# Patient Record
Sex: Male | Born: 1945 | Race: White | Hispanic: No | Smoking: Never smoker
Health system: Southern US, Community
[De-identification: ages and names within clinical notes are randomized; demographics above are authoritative.]

## PROBLEM LIST (undated history)

## (undated) DIAGNOSIS — E119 Type 2 diabetes mellitus without complications: Secondary | ICD-10-CM

## (undated) DIAGNOSIS — G718 Other primary disorders of muscles: Secondary | ICD-10-CM

## (undated) DIAGNOSIS — E785 Hyperlipidemia, unspecified: Secondary | ICD-10-CM

## (undated) DIAGNOSIS — G629 Polyneuropathy, unspecified: Secondary | ICD-10-CM

## (undated) DIAGNOSIS — I10 Essential (primary) hypertension: Secondary | ICD-10-CM

## (undated) DIAGNOSIS — N400 Enlarged prostate without lower urinary tract symptoms: Secondary | ICD-10-CM

## (undated) DIAGNOSIS — Z789 Other specified health status: Secondary | ICD-10-CM

## (undated) DIAGNOSIS — I2510 Atherosclerotic heart disease of native coronary artery without angina pectoris: Secondary | ICD-10-CM

## (undated) DIAGNOSIS — E7800 Pure hypercholesterolemia, unspecified: Secondary | ICD-10-CM

## (undated) DIAGNOSIS — I219 Acute myocardial infarction, unspecified: Secondary | ICD-10-CM

## (undated) DIAGNOSIS — N319 Neuromuscular dysfunction of bladder, unspecified: Secondary | ICD-10-CM

## (undated) HISTORY — PX: CARDIAC SURGERY: SHX584

## (undated) HISTORY — PX: TONSILLECTOMY: SUR1361

## (undated) HISTORY — DX: Polyneuropathy, unspecified: G62.9

## (undated) HISTORY — PX: CORONARY ARTERY BYPASS GRAFT: SHX141

## (undated) HISTORY — DX: Other primary disorders of muscles: G71.8

## (undated) HISTORY — PX: OTHER SURGICAL HISTORY: SHX169

## (undated) HISTORY — DX: Benign prostatic hyperplasia without lower urinary tract symptoms: N40.0

## (undated) HISTORY — DX: Hyperlipidemia, unspecified: E78.5

## (undated) HISTORY — DX: Acute myocardial infarction, unspecified: I21.9

## (undated) HISTORY — DX: Neuromuscular dysfunction of bladder, unspecified: N31.9

## (undated) HISTORY — DX: Pure hypercholesterolemia, unspecified: E78.00

## (undated) HISTORY — PX: FINGER FRACTURE SURGERY: SHX638

## (undated) HISTORY — DX: Type 2 diabetes mellitus without complications: E11.9

## (undated) HISTORY — DX: Essential (primary) hypertension: I10

---
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
## 2009-05-24 ENCOUNTER — Ambulatory Visit: Payer: Self-pay | Admitting: Gastroenterology

## 2010-09-01 DIAGNOSIS — I219 Acute myocardial infarction, unspecified: Secondary | ICD-10-CM

## 2010-09-01 HISTORY — DX: Acute myocardial infarction, unspecified: I21.9

## 2014-03-10 ENCOUNTER — Ambulatory Visit
Admission: RE | Admit: 2014-03-10 | Discharge: 2014-03-10 | Disposition: A | Discharge status: Home / Self Care | Payer: Self-pay | Source: Ambulatory Visit | Attending: Neurological Surgery | Admitting: Neurological Surgery

## 2014-03-10 ENCOUNTER — Other Ambulatory Visit: Payer: Self-pay | Admitting: Neurological Surgery

## 2014-03-10 DIAGNOSIS — R52 Pain, unspecified: Secondary | ICD-10-CM

## 2014-05-31 ENCOUNTER — Other Ambulatory Visit: Payer: Self-pay | Admitting: Neurological Surgery

## 2014-05-31 DIAGNOSIS — M47812 Spondylosis without myelopathy or radiculopathy, cervical region: Secondary | ICD-10-CM

## 2014-06-09 ENCOUNTER — Other Ambulatory Visit: Payer: Self-pay | Admitting: Neurological Surgery

## 2014-06-09 DIAGNOSIS — M25512 Pain in left shoulder: Secondary | ICD-10-CM

## 2014-06-12 ENCOUNTER — Ambulatory Visit
Admission: RE | Admit: 2014-06-12 | Discharge: 2014-06-12 | Disposition: A | Discharge status: Home / Self Care | Payer: No Typology Code available for payment source | Source: Ambulatory Visit | Attending: Neurological Surgery | Admitting: Neurological Surgery

## 2014-06-12 DIAGNOSIS — M65812 Other synovitis and tenosynovitis, left shoulder: Secondary | ICD-10-CM | POA: Insufficient documentation

## 2014-06-12 DIAGNOSIS — M542 Cervicalgia: Secondary | ICD-10-CM | POA: Insufficient documentation

## 2014-06-12 DIAGNOSIS — M25512 Pain in left shoulder: Secondary | ICD-10-CM

## 2014-06-12 DIAGNOSIS — M47812 Spondylosis without myelopathy or radiculopathy, cervical region: Secondary | ICD-10-CM | POA: Insufficient documentation

## 2014-06-26 ENCOUNTER — Ambulatory Visit
Admission: RE | Admit: 2014-06-26 | Discharge: 2014-06-26 | Disposition: A | Discharge status: Home / Self Care | Payer: No Typology Code available for payment source | Source: Ambulatory Visit | Attending: Neurology | Admitting: Neurology

## 2014-06-26 DIAGNOSIS — G608 Other hereditary and idiopathic neuropathies: Secondary | ICD-10-CM | POA: Insufficient documentation

## 2014-06-26 DIAGNOSIS — G5602 Carpal tunnel syndrome, left upper limb: Secondary | ICD-10-CM | POA: Insufficient documentation

## 2014-06-26 DIAGNOSIS — G5601 Carpal tunnel syndrome, right upper limb: Secondary | ICD-10-CM | POA: Insufficient documentation

## 2014-06-26 NOTE — Procedures (Signed)
Date: 06/26/2014  PROCEDURE: Nerve conduction/EMG bilateral upper and lower  extremities.    REFERRING PHYSICIAN: Haskel Khan, MD    CLINICAL: Nerve conduction EMG of the bilateral upper and lower  extremities were performed for this 68 year old man with  progressive left arm weakness and atrophy of muscle groups to  assess for a focal radiculopathy, a compressive mononeuropathy  or large fiber polyneuropathy or alternatively motor neuron  disease.  The patient also complains of numbness in the right  foot.  The patient has history of diabetes mellitus type 2.  On  neurological exam, deep tendon reflexes are 1/4 throughout and  absent at the ankles.  Tinel signs are absent at the bilateral  median nerves at the wrist and bilateral ulnar nerves at the  elbow.  Power is 5/5 throughout in the upper extremities  but for the left upper extremity where there is atrophy in the  muscles of the left forearm and triceps.  Power is 4 to 4-/5 at  the intrinsic muscles of the left hand and triceps.  There are  no fasciculations.    FINDINGS: MOTOR NERVE STUDIES:    At the bilateral median motor nerves, there were mild reductions  in conduction velocities and mild prolongation of distal  latencies.  At the bilateral ulnar motor nerves there were mild  reductions in conduction velocities with no significant change  across the elbows in terms of the CMAP amplitudes.  At the  bilateral peroneal and tibial motor nerves, there were mild  reductions in conduction velocities.  Sequential stimulation of  the bilateral median and bilateral ulnar motor nerves at the  wrist, recording at the 2nd lumbrical and 1st dorsal interossei  respectively, identified significant prolongation of distal  latencies when stimulating at the bilateral median motor nerves.    SENSORY NERVE STUDIES:    At the bilateral median mixed palmar nerves, there were mild  reductions in conduction velocities and moderate prolongation of  peak distal latencies.  On  the right side only there was a mild  reduction in SNAP amplitude.  At the bilateral ulnar mixed  palmar nerves, there were mild to moderate reductions in  conduction velocities and mild prolongation of peak distal  latencies.  On the left side only there was a mild reduction in  SNAP amplitude.  At the bilateral sural sensory nerves, there  were mild reductions in conduction velocities.  At the bilateral  superficial peroneal sensory nerves, there were also mild  reductions in conduction velocities.  At the bilateral medial  and lateral antebrachial cutaneous sensory nerves, there were  mild reductions in conduction velocities.    F-WAVE LATENCIES:    F-wave latencies at the right median and bilateral ulnar motor  nerves were all prolonged whereas elsewhere they were normal.    EMG:    Concentric needle examination of selected bilateral upper,  bilateral lower, bilateral cervical and bilateral lumbosacral  paraspinal muscles identified no increased insertional activity.  With activation and recruitment, there was a mild to moderate  increase in compound motor unit action potential amplitude with  increased duration, polyphasic features, mild reduction in  activation and moderate reduction in recruitment at the  bilateral abductor pollicis brevis as well as the left first  dorsal interossei, extensor indicis proprius and triceps.    CLINICAL INTERPRETATION: There is electrophysiological evidence  for the following:    1.  Chronic moderate bilateral carpal tunnel syndrome.  2.  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  3.  Chronic moderate neurogenic changes at the left C7 and      C8 myotomes.  Possible localizations would be left cervical      radiculopathies or alternatively left brachial plexus, lower      trunk.    There is no neurophysiological evidence for motor neuron disease  to explain the patient's left upper extremity focal weakness  with atrophy.        [id number]  DD:  06/26/2014 10:22:36  DT: 06/26/2014 10:54:43  JOB: [id number]

## 2014-07-17 ENCOUNTER — Other Ambulatory Visit: Payer: Self-pay | Admitting: Neurological Surgery

## 2014-07-17 ENCOUNTER — Ambulatory Visit
Admission: RE | Admit: 2014-07-17 | Discharge: 2014-07-17 | Disposition: A | Discharge status: Home / Self Care | Payer: No Typology Code available for payment source | Source: Ambulatory Visit | Attending: Neurological Surgery | Admitting: Neurological Surgery

## 2014-07-17 DIAGNOSIS — E1141 Type 2 diabetes mellitus with diabetic mononeuropathy: Secondary | ICD-10-CM | POA: Insufficient documentation

## 2014-07-17 DIAGNOSIS — R0602 Shortness of breath: Secondary | ICD-10-CM | POA: Insufficient documentation

## 2015-01-09 ENCOUNTER — Ambulatory Visit (HOSPITAL_BASED_OUTPATIENT_CLINIC_OR_DEPARTMENT_OTHER): Payer: No Typology Code available for payment source | Admitting: Anesthesiology

## 2015-01-09 ENCOUNTER — Ambulatory Visit
Admission: RE | Admit: 2015-01-09 | Discharge: 2015-01-09 | Disposition: A | Discharge status: Home / Self Care | Payer: No Typology Code available for payment source | Attending: Gastroenterology | Admitting: Gastroenterology

## 2015-01-09 ENCOUNTER — Ambulatory Visit (HOSPITAL_BASED_OUTPATIENT_CLINIC_OR_DEPARTMENT_OTHER): Payer: No Typology Code available for payment source | Admitting: Gastroenterology

## 2015-01-09 ENCOUNTER — Encounter (HOSPITAL_BASED_OUTPATIENT_CLINIC_OR_DEPARTMENT_OTHER): Payer: Self-pay

## 2015-01-09 ENCOUNTER — Encounter (HOSPITAL_BASED_OUTPATIENT_CLINIC_OR_DEPARTMENT_OTHER)
Admission: RE | Disposition: A | Discharge status: Home / Self Care | Payer: Self-pay | Source: Home / Self Care | Attending: Gastroenterology

## 2015-01-09 DIAGNOSIS — D123 Benign neoplasm of transverse colon: Secondary | ICD-10-CM | POA: Insufficient documentation

## 2015-01-09 DIAGNOSIS — K5730 Diverticulosis of large intestine without perforation or abscess without bleeding: Secondary | ICD-10-CM | POA: Insufficient documentation

## 2015-01-09 DIAGNOSIS — D124 Benign neoplasm of descending colon: Secondary | ICD-10-CM | POA: Insufficient documentation

## 2015-01-09 DIAGNOSIS — K648 Other hemorrhoids: Secondary | ICD-10-CM | POA: Insufficient documentation

## 2015-01-09 DIAGNOSIS — Z1211 Encounter for screening for malignant neoplasm of colon: Secondary | ICD-10-CM | POA: Insufficient documentation

## 2015-01-09 HISTORY — PX: COLONOSCOPY: SHX174

## 2015-01-09 HISTORY — DX: Type 2 diabetes mellitus without complications: E11.9

## 2015-01-09 HISTORY — DX: Essential (primary) hypertension: I10

## 2015-01-09 SURGERY — DONT USE, USE 1094-COLONOSCOPY, DIAGNOSTIC (SCREENING)
Anesthesia: Anesthesia MAC / Sedation | Site: Anus | Wound class: Clean Contaminated

## 2015-01-09 MED ORDER — ONDANSETRON HCL 4 MG/2ML IJ SOLN
4.0000 mg | INTRAMUSCULAR | Status: DC | PRN
Start: 2015-01-09 — End: 2015-01-09

## 2015-01-09 MED ORDER — PROPOFOL 10 MG/ML IV EMUL (WRAP)
INTRAVENOUS | Status: AC
Start: 2015-01-09 — End: ?
  Filled 2015-01-09: qty 20

## 2015-01-09 MED ORDER — ONDANSETRON 4 MG PO TBDP
4.0000 mg | ORAL_TABLET | ORAL | Status: DC | PRN
Start: 2015-01-09 — End: 2015-01-09

## 2015-01-09 MED ORDER — SODIUM CHLORIDE 0.9 % IV SOLN
INTRAVENOUS | Status: DC
Start: 2015-01-09 — End: 2015-01-09

## 2015-01-09 MED ORDER — PROPOFOL 10 MG/ML IV EMUL (WRAP)
INTRAVENOUS | Status: DC | PRN
Start: 2015-01-09 — End: 2015-01-09
  Administered 2015-01-09: 80 mg via INTRAVENOUS
  Administered 2015-01-09 (×2): 40 mg via INTRAVENOUS
  Administered 2015-01-09: 50 mg via INTRAVENOUS
  Administered 2015-01-09 (×3): 40 mg via INTRAVENOUS

## 2015-01-09 SURGICAL SUPPLY — 10 items
CONN IRRG TORRENT 1WAYVAL ENDO (Supply) ×2 IMPLANT
FORCEP BIOPSY HOT RADIAL JAW 4 (Supply) IMPLANT
FORCEP BIOPSY RAD JAW 1333-40 (Supply) IMPLANT
FORCEP RADIAL JAW JUMBO 240CM (Supply) IMPLANT
MARKER ENDOSCOPIC SPOT (Supply) IMPLANT
NDL INTERJECT SCLERO 25G (Supply) IMPLANT
SNARE ROTATE SM OVAL MED STFF (Supply) IMPLANT
SNARE SMALL CAPTIV 6230 (Supply) IMPLANT
SNARE WIRE DISP 10MM OVAL (Supply) ×2 IMPLANT
TUBING IRRIGATION TORRENT (Supply) ×2 IMPLANT

## 2015-01-09 NOTE — Transfer of Care (Signed)
Anesthesia Transfer of Care Note    Patient: Duane Thomas    Last vitals:   Filed Vitals:    01/09/15 1001   BP: 119/95   Pulse: 78   Temp:    Resp: 18   SpO2: 98%       Oxygen: Nasal Cannula     Mental Status:awake and sedated    Airway: Natural    Cardiovascular Status:  stable

## 2015-01-09 NOTE — Discharge Instructions (Signed)
[facility]  Colonoscopy/Sigmoidoscopy  Discharge Instructions    Thank you for allowing us to be a part of your health care experience.  We realize you may not fully recall your discharge care.  The following information is to guide you.    A.  After you leave the hospital:   1.  Due to the effects of the sedatives, you may feel tired for the remainder of today.   2.  DO NOT DRIVE OR OPERATE HAZARDOUS MACHINERY until tomorrow.   3.  Please rest and drink extra fluids today.  Avoid alcohol today.   4.  Resume your normal diet as tolerated.   5. If you experience anal soreness, you may apply a soothing ointment of your choice.   6.  You may feel bloated and pass air today.   7.  You may resume your normal activities (work) tomorrow.    B.  If you experience any of the following danger signs or symptoms, call your doctor immediately:  After business hours call [phone] for [facility] for physician guidance.    1.   Passing  Blood in the stool or a change of stool consistency (black).   2.  Passing Clotted blood.   3.  New onset of pain or distension, that does not subside or prevents you from normal activity.   4.  Redness or swelling at the IV insertion site that continues or worsens over 2-3 days. Initial warm compresses may be helpful.    All [facility] us [facility] the [facility] who supported you today during your procedure wish you a quick and comfortable recovery.  For any questions or concerns about your personal care, contact us at [phone].

## 2015-01-09 NOTE — Anesthesia Postprocedure Evaluation (Signed)
Anesthesia Post Evaluation    Patient: Duane Thomas    Procedures performed: Procedure(s):  COLONOSCOPY    Anesthesia type: MAC    Patient location:PACU    Last vitals:   Filed Vitals:    01/09/15 1001   BP: 119/95   Pulse: 78   Temp:    Resp: 18   SpO2: 98%       Post pain: Patient not complaining of pain, continue current therapy      Mental Status:awake and sedated    Respiratory Function: tolerating nasal cannula    Cardiovascular: stable    Nausea/Vomiting: patient not complaining of nausea or vomiting    Hydration Status: adequate    Post assessment: no apparent anesthetic complications

## 2015-01-09 NOTE — Anesthesia Preprocedure Evaluation (Signed)
Anesthesia Evaluation    AIRWAY    Mallampati: I    TM distance: <3 FB  Neck ROM: full  Mouth Opening:full   CARDIOVASCULAR    regular and normal       DENTAL    no notable dental hx     PULMONARY    clear to auscultation     OTHER FINDINGS                      Anesthesia Plan    ASA 3     MAC               (Pt tolerates activity w/o CP/SOB, denies SL NTG use, anesth plan d/w pt in detail)      intravenous induction   Detailed anesthesia plan: MAC      Post Op: other  Post op pain management: per surgeon    informed consent obtained    ECG reviewed  pertinent labs reviewed

## 2015-01-09 NOTE — Brief Op Note (Signed)
See full colonoscopy report in provation MD

## 2015-01-10 ENCOUNTER — Encounter (HOSPITAL_BASED_OUTPATIENT_CLINIC_OR_DEPARTMENT_OTHER): Payer: Self-pay | Admitting: Gastroenterology

## 2016-12-11 ENCOUNTER — Ambulatory Visit
Admission: RE | Admit: 2016-12-11 | Discharge: 2016-12-11 | Disposition: A | Discharge status: Home / Self Care | Payer: No Typology Code available for payment source | Source: Ambulatory Visit | Attending: Family Medicine | Admitting: Family Medicine

## 2016-12-11 DIAGNOSIS — E119 Type 2 diabetes mellitus without complications: Secondary | ICD-10-CM | POA: Insufficient documentation

## 2016-12-11 DIAGNOSIS — I10 Essential (primary) hypertension: Secondary | ICD-10-CM | POA: Insufficient documentation

## 2016-12-11 DIAGNOSIS — R109 Unspecified abdominal pain: Secondary | ICD-10-CM | POA: Insufficient documentation

## 2016-12-11 LAB — COMPREHENSIVE METABOLIC PANEL
ALT: 26 U/L (ref 0–55)
AST (SGOT): 19 U/L (ref 10–42)
Albumin/Globulin Ratio: 1.44 Ratio (ref 0.70–1.50)
Albumin: 3.9 gm/dL (ref 3.5–5.0)
Alkaline Phosphatase: 67 U/L (ref 40–145)
Anion Gap: 12.6 mMol/L (ref 7.0–18.0)
BUN / Creatinine Ratio: 10.6 Ratio (ref 10.0–30.0)
BUN: 10 mg/dL (ref 7–22)
Bilirubin, Total: 1.1 mg/dL (ref 0.1–1.2)
CO2: 24.8 mMol/L (ref 20.0–30.0)
Calcium: 9.1 mg/dL (ref 8.5–10.5)
Chloride: 107 mMol/L (ref 98–110)
Creatinine: 0.94 mg/dL (ref 0.80–1.30)
EGFR: 82 mL/min/{1.73_m2} (ref 60–150)
Globulin: 2.7 gm/dL (ref 2.0–4.0)
Glucose: 144 mg/dL — ABNORMAL HIGH (ref 71–99)
Osmolality Calc: 281 mOsm/kg (ref 275–300)
Potassium: 4.4 mMol/L (ref 3.5–5.3)
Protein, Total: 6.6 gm/dL (ref 6.0–8.3)
Sodium: 140 mMol/L (ref 136–147)

## 2016-12-11 LAB — CBC AND DIFFERENTIAL
Basophils %: 0.2 % (ref 0.0–3.0)
Basophils Absolute: 0 10*3/uL (ref 0.0–0.3)
Eosinophils %: 0.6 % (ref 0.0–7.0)
Eosinophils Absolute: 0.1 10*3/uL (ref 0.0–0.8)
Hematocrit: 45.4 % (ref 39.0–52.5)
Hemoglobin: 15.2 gm/dL (ref 13.0–17.5)
Lymphocytes Absolute: 1.6 10*3/uL (ref 0.6–5.1)
Lymphocytes: 15.2 % (ref 15.0–46.0)
MCH: 31 pg (ref 28–35)
MCHC: 34 gm/dL (ref 32–36)
MCV: 93 fL (ref 80–100)
MPV: 8.4 fL (ref 6.0–10.0)
Monocytes Absolute: 0.7 10*3/uL (ref 0.1–1.7)
Monocytes: 7.1 % (ref 3.0–15.0)
Neutrophils %: 76.8 % (ref 42.0–78.0)
Neutrophils Absolute: 7.9 10*3/uL (ref 1.7–8.6)
PLT CT: 254 10*3/uL (ref 130–440)
RBC: 4.91 10*6/uL (ref 4.00–5.70)
RDW: 11.8 % (ref 11.0–14.0)
WBC: 10.3 10*3/uL (ref 4.0–11.0)

## 2016-12-11 LAB — HEMOGLOBIN A1C: Hgb A1C, %: 6.3 %

## 2016-12-14 LAB — VH MISCELLANEOUS TEST: Test code and name of test: 205

## 2018-06-14 ENCOUNTER — Ambulatory Visit (INDEPENDENT_AMBULATORY_CARE_PROVIDER_SITE_OTHER): Payer: Medicare Other | Admitting: Urology

## 2018-06-14 ENCOUNTER — Encounter: Payer: Self-pay | Admitting: Urology

## 2018-06-14 VITALS — BP 142/86 | HR 83 | Ht 69.0 in | Wt 204.1 lb

## 2018-06-14 DIAGNOSIS — N390 Urinary tract infection, site not specified: Secondary | ICD-10-CM

## 2018-06-14 DIAGNOSIS — N138 Other obstructive and reflux uropathy: Secondary | ICD-10-CM | POA: Diagnosis not present

## 2018-06-14 DIAGNOSIS — N401 Enlarged prostate with lower urinary tract symptoms: Secondary | ICD-10-CM | POA: Diagnosis not present

## 2018-06-14 LAB — BLADDER SCAN AMB NON-IMAGING: Scan Result: 374

## 2018-06-14 MED ORDER — TAMSULOSIN HCL 0.4 MG PO CAPS: 0.4000 mg | ORAL_CAPSULE | Freq: Every day | ORAL | 3 refills | Status: DC

## 2018-06-14 NOTE — Progress Notes (Signed)
06/14/2018 2:43 PM   Kyle Richards [birth year] [id number]  Referring provider: Frederica Kuster, PA-C [name], [place] [id number]  Chief Complaint  Patient presents with  . Urinary Tract Infection    HPI: Patient is 72 year old Caucasian male who was referred by Kyle Pina L. Meyer, PA-C for urinary tract infection.  He presented to [facility] urgent care with a complaint of 2 days of nocturia, urinary urgency, decreased urination.  His UA was positive for greater than 50 WBCs and many bacteria.  His urine culture was positive for Staphylococcus epidermidis.  He is given a prescription of Ceftin and referred on to urology.  He is currently on finasteride 5 mg daily.    Today, he is complaining of urinary frequency.  Patient denies any gross hematuria, dysuria or suprapubic/flank pain.  Patient denies any fevers, chills, nausea or vomiting.  His PVR is 374 mL.  He was switched to cefuroxime today.    He is seen at the [place].  He has Agent Orange exposure.    He was seen by urology in [place] for kidney stones in 2013.  He also underwent a prostate biopsy with Imprimis eleven years ago.    PMH: Past Medical History:  Diagnosis Date  . Diabetes ([place])   . Heart attack ([place]) 2012  . High cholesterol   . Hypertension   . Neuropathy     Surgical History: Open heart surgery 2012 Tonsils 1953 Bones broken  Home Medications:  Allergies as of 06/14/2018   No Known Allergies     Medication List        Accurate as of 06/14/18  2:43 PM. Always use your most recent med list.          aspirin 81 MG chewable tablet Chew 81 mg by mouth daily.   atorvastatin 20 MG tablet Commonly known as:  LIPITOR Take 20 mg by mouth daily.   cefUROXime 250 MG tablet Commonly known as:  CEFTIN Take 250 mg by mouth 2 (two) times daily.   finasteride 5 MG tablet Commonly known as:  PROSCAR Take 5 mg by mouth daily.   lisinopril 5 MG tablet Commonly known as:   PRINIVIL,ZESTRIL Take 5 mg by mouth daily.   metFORMIN 1000 MG tablet Commonly known as:  GLUCOPHAGE Take 1,000 mg by mouth daily.   metoprolol tartrate 25 MG tablet Commonly known as:  LOPRESSOR Take 25 mg by mouth daily.   tamsulosin 0.4 MG Caps capsule Commonly known as:  FLOMAX Take 1 capsule (0.4 mg total) by mouth daily.   VITAMIN D-1000 MAX ST 1000 units tablet Generic drug:  Cholecalciferol Take 1,000 Units by mouth daily.       Allergies: No Known Allergies  Family History: He is adopted.    Social History:  reports that he has never smoked. He has never used smokeless tobacco. He reports that he drinks alcohol. He reports that he does not use drugs.  ROS: UROLOGY Frequent Urination?: Yes Hard to postpone urination?: No Burning/pain with urination?: No Get up at night to urinate?: No Leakage of urine?: No Urine stream starts and stops?: No Trouble starting stream?: No Do you have to strain to urinate?: No Blood in urine?: No Urinary tract infection?: Yes Sexually transmitted disease?: No Injury to kidneys or bladder?: No Painful intercourse?: No Weak stream?: No Erection problems?: No Penile pain?: No  Gastrointestinal Nausea?: No Vomiting?: No Indigestion/heartburn?: No Diarrhea?: No Constipation?: No  Constitutional Fever: No Night sweats?: No  Weight loss?: No Fatigue?: No  Skin Skin rash/lesions?: No Itching?: No  Eyes Blurred vision?: No Double vision?: No  Ears/Nose/Throat Sore throat?: No Sinus problems?: No  Hematologic/Lymphatic Swollen glands?: No Easy bruising?: No  Cardiovascular Leg swelling?: No Chest pain?: No  Respiratory Cough?: No Shortness of breath?: No  Endocrine Excessive thirst?: No  Musculoskeletal Back pain?: No Joint pain?: No  Neurological Headaches?: No Dizziness?: No  Psychologic Depression?: No Anxiety?: No  Physical Exam: BP (!) 142/86 (BP Location: Left Arm, Patient Position:  Sitting, Cuff Size: Normal)   Pulse 83   Ht 5\' 9"  (1.753 m)   Wt 204 lb 1.6 oz (92.6 kg)   BMI 30.14 kg/m   Constitutional:  Well nourished. Alert and oriented, No acute distress. HEENT: [place] AT, moist mucus membranes.  Trachea midline, no masses. Cardiovascular: No clubbing, cyanosis, or edema. Respiratory: Normal respiratory effort, no increased work of breathing. GI: Abdomen is soft, non tender, non distended, no abdominal masses. Liver and spleen not palpable.  No hernias appreciated.  Stool sample for occult testing is not indicated.   GU: No CVA tenderness.  No bladder fullness or masses.  Patient with circumcised phallus.  Urethral meatus is patent.  No penile discharge. Small condyloma on the left shaft.  Scrotum without lesions, cysts, rashes and/or edema.  Testicles are located scrotally bilaterally. No masses are appreciated in the testicles. Left and right epididymis are normal. Rectal: Patient with  normal sphincter tone. Anus and perineum without scarring or rashes. No rectal masses are appreciated. Prostate is approximately 50 grams, could only palpate the apex and midportion of the glands, no nodules are appreciated.  Skin: No rashes, bruises or suspicious lesions. Lymph: No cervical or inguinal adenopathy. Neurologic: Grossly intact, no focal deficits, moving all 4 extremities. Psychiatric: Normal mood and affect.  Laboratory Data: No results found for: WBC, HGB, HCT, MCV, PLT  No results found for: CREATININE  No results found for: PSA  No results found for: TESTOSTERONE  No results found for: HGBA1C  No results found for: TSH  No results found for: CHOL, HDL, CHOLHDL, VLDL, LDLCALC  No results found for: AST No results found for: ALT No components found for: ALKALINEPHOPHATASE No components found for: BILIRUBINTOTAL  No results found for: ESTRADIOL  Urinalysis No results found for: COLORURINE, APPEARANCEUR, LABSPEC, PHURINE, GLUCOSEU, HGBUR, BILIRUBINUR,  KETONESUR, PROTEINUR, UROBILINOGEN, NITRITE, LEUKOCYTESUR  I have reviewed the labs.   Pertinent Imaging: Results for Kyle Richards (MRN [id number]) as of 06/14/2018 14:33  Ref. Range 06/14/2018 14:05  Scan Result Unknown 374    Assessment & Plan:    1. Urinary tract infection without hematuria, site unspecified - Bladder Scan (Post Void Residual) in office ?  Incomplete bladder emptying being the result of a urinary tract infection or the incomplete bladder emptying (?  Neuropathy) contributing to urinary tract infection patient will be initiated on tamsulosin return in 1 month for PVR - patient is currently on an antibiotic  2. BPH with LUTS Continue conservative management, avoiding bladder irritants and timed voiding's Initiate alpha-blocker (tamsulosin 0.4 mg daily), discussed side effects  Continue finasteride 5 mg daily RTC in one months for I PSS, PSA and PVR    Return in about 1 month (around 07/15/2018) for IPSS, PSA and PVR.  These notes generated with voice recognition software. I apologize for typographical errors.  Zara Council, PA-C  [facility] [address]  [place], [place] [address] [phone]

## 2018-07-15 ENCOUNTER — Ambulatory Visit: Payer: Medicare Other | Admitting: Urology

## 2018-07-16 ENCOUNTER — Encounter (INDEPENDENT_AMBULATORY_CARE_PROVIDER_SITE_OTHER): Payer: Self-pay

## 2018-07-16 ENCOUNTER — Ambulatory Visit (INDEPENDENT_AMBULATORY_CARE_PROVIDER_SITE_OTHER): Payer: Medicare Other | Admitting: Urology

## 2018-07-16 ENCOUNTER — Encounter: Payer: Self-pay | Admitting: Urology

## 2018-07-16 ENCOUNTER — Other Ambulatory Visit: Payer: Self-pay

## 2018-07-16 ENCOUNTER — Other Ambulatory Visit
Admission: RE | Admit: 2018-07-16 | Discharge: 2018-07-16 | Disposition: A | Discharge status: Home / Self Care | Payer: Medicare Other | Source: Ambulatory Visit | Attending: Urology | Admitting: Urology

## 2018-07-16 VITALS — BP 116/71 | HR 76 | Ht 69.0 in | Wt 198.0 lb

## 2018-07-16 DIAGNOSIS — N138 Other obstructive and reflux uropathy: Secondary | ICD-10-CM | POA: Diagnosis not present

## 2018-07-16 DIAGNOSIS — R972 Elevated prostate specific antigen [PSA]: Secondary | ICD-10-CM | POA: Insufficient documentation

## 2018-07-16 DIAGNOSIS — N401 Enlarged prostate with lower urinary tract symptoms: Secondary | ICD-10-CM

## 2018-07-16 DIAGNOSIS — R339 Retention of urine, unspecified: Secondary | ICD-10-CM | POA: Diagnosis not present

## 2018-07-16 LAB — BLADDER SCAN AMB NON-IMAGING

## 2018-07-16 LAB — PSA: Prostatic Specific Antigen: 1.66 ng/mL (ref 0.00–4.00)

## 2018-07-16 NOTE — Progress Notes (Signed)
07/16/2018 2:39 PM   Kyle Richards [birth year] [id number]  Referring provider: No referring provider defined for this encounter.  Chief Complaint  Patient presents with  . Benign Prostatic Hypertrophy    1 month    HPI: 72 yo M who who returns today for one-month follow-up of UTI and incomplete bladder emptying.  Initially seen and evaluated by Kyle Richards on 06/14/2018 after having recently being diagnosed with a urinary tract infection.  This is second UTI ever (once last year).  He was treated for staph epidermal UTI with Ceftin.  He is chronically on finasteride and more recently been started on Flomax.  Today, he reports that he is doing exceptionally well.  All of his urinary symptoms has resolved.  IPSS as below.  At last visit, his PVR was 374 cc which remains persistently elevated today.  He denies feeling like he has retained urine in his bladder.  He voided just prior to bladder scan today.  He was previously seen in [facility] urology for kidney stones in 2013.  He also had a prostate biopsy in the remote past, greater than 10 years ago here in [place].  He recalls that this biopsy was negative.  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  IPSS    Row Name 07/16/18 1300         International Prostate Symptom Score   How often have you had the sensation of not emptying your bladder?  Not at All     How often have you had to urinate less than every two hours?  Not at All     How often have you found you stopped and started again several times when you urinated?  Not at All     How often have you found it difficult to postpone urination?  Not at All     How often have you had a weak urinary stream?  Not at All     How often have you had to strain to start urination?  Not at All     How many times did you typically get up at night to urinate?  1 Time     Total IPSS Score  1       Quality of Life due to urinary symptoms   If you were to spend the rest of  your life with your urinary condition just the way it is now how would you feel about that?  Mostly Satisfied        Score:  1-7 Mild 8-19 Moderate 20-35 Severe    PMH: Past Medical History:  Diagnosis Date  . Diabetes ([place])   . Heart attack ([place]) 2012  . High cholesterol   . Hypertension   . Neuropathy     Surgical History: Past Surgical History:  Procedure Laterality Date  . FINGER FRACTURE SURGERY Left    middle finger left hand  . TONSILLECTOMY AND ADENOIDECTOMY  1953    Home Medications:  Allergies as of 07/16/2018   No Known Allergies     Medication List        Accurate as of 07/16/18 11:59 PM. Always use your most recent med list.          aspirin 81 MG chewable tablet Chew 81 mg by mouth daily.   atorvastatin 20 MG tablet Commonly known as:  LIPITOR Take 20 mg by mouth daily.   finasteride 5 MG tablet Commonly known as:  PROSCAR Take 5 mg by mouth daily.  lisinopril 5 MG tablet Commonly known as:  PRINIVIL,ZESTRIL Take 5 mg by mouth daily.   metFORMIN 1000 MG tablet Commonly known as:  GLUCOPHAGE Take 1,000 mg by mouth daily.   metoprolol tartrate 25 MG tablet Commonly known as:  LOPRESSOR Take 25 mg by mouth daily.   tamsulosin 0.4 MG Caps capsule Commonly known as:  FLOMAX Take 1 capsule (0.4 mg total) by mouth daily.   VITAMIN D-1000 MAX ST 25 MCG (1000 UT) tablet Generic drug:  Cholecalciferol Take 1,000 Units by mouth daily.       Allergies: No Known Allergies  Family History: History reviewed. No pertinent family history.  Social History:  reports that he has never smoked. He has never used smokeless tobacco. He reports that he drinks alcohol. He reports that he does not use drugs.  ROS: UROLOGY Frequent Urination?: No Hard to postpone urination?: No Burning/pain with urination?: No Get up at night to urinate?: No Leakage of urine?: No Urine stream starts and stops?: No Trouble starting stream?: No Do you have  to strain to urinate?: No Blood in urine?: No Urinary tract infection?: Yes Sexually transmitted disease?: No Injury to kidneys or bladder?: No Painful intercourse?: No Weak stream?: No Erection problems?: No Penile pain?: No  Gastrointestinal Nausea?: No Vomiting?: No Indigestion/heartburn?: No Diarrhea?: No Constipation?: No  Constitutional Fever: No Night sweats?: No Weight loss?: No Fatigue?: No  Skin Skin rash/lesions?: No Itching?: No  Eyes Blurred vision?: No Double vision?: No  Ears/Nose/Throat Sore throat?: No Sinus problems?: No  Hematologic/Lymphatic Swollen glands?: No Easy bruising?: No  Cardiovascular Leg swelling?: No Chest pain?: No  Respiratory Cough?: No Shortness of breath?: No  Endocrine Excessive thirst?: No  Musculoskeletal Back pain?: No Joint pain?: No  Neurological Headaches?: No Dizziness?: No  Psychologic Depression?: No Anxiety?: No  Physical Exam: BP 116/71   Pulse 76   Ht 5\' 9"  (1.753 m)   Wt 198 lb (89.8 kg)   BMI 29.24 kg/m   Constitutional:  Alert and oriented, No acute distress. HEENT: [name] AT, moist mucus membranes.  Trachea midline, no masses. Cardiovascular: No clubbing, cyanosis, or edema. Respiratory: Normal respiratory effort, no increased work of breathing. Skin: No rashes, bruises or suspicious lesions. Neurologic: Grossly intact, no focal deficits, moving all 4 extremities. Psychiatric: Normal mood and affect.  Laboratory Data: No lab data available here in our system or care everywhere.  Patient reports he is primarily followed at the [place].   Pertinent Imaging: Results for orders placed or performed during the hospital encounter of 07/16/18  PSA  Result Value Ref Range   Prostatic Specific Antigen 1.66 0.00 - 4.00 ng/mL   ER 345 cc   Assessment & Plan:    1. BPH with obstruction/lower urinary tract symptoms On maximal medical therapy, Flomax which was recently started on  finasteride Continue these medications Incomplete bladder emptying as below,?  Hypofunctioning detrusor secondary to bladder neuropathy versus outlet obstruction We discussed options for management which include conservative management with close follow-up including serial evaluations with creatinine, renal ultrasound, and PVRs in the office to assess that he develops no sequela from chronic incomplete emptying which may include bladder stones, diverticula, upper tract damage, infections, etc. Alternatives including pursuing urodynamics with consideration of an outlet procedure if deemed appropriate was also considered including cystoscopy He would be interested in pursuing the latter - BLADDER SCAN AMB NON-IMAGING - Ambulatory referral to Urology  2. Incomplete bladder emptying Added Cr onto labs to ensure renal fuction  is normal Other than recent urinary tract infection, the patient is otherwise asymptomatic now on maximal medical therapy with finasteride and Flomax   Return in about 6 weeks (around 08/27/2018) for review UDS, possible cystoscopy.  Hollice Espy, MD  [facility] [address] [place],  [address] [phone]  I spent 25 min with this patient of which greater than 50% was spent in counseling and coordination of care with the patient.

## 2018-07-22 ENCOUNTER — Telehealth: Payer: Self-pay | Admitting: Urology

## 2018-07-22 NOTE — Telephone Encounter (Signed)
Pt called asking about his results from his appt weeks ago. Please advise pt of lab results. Thank you.

## 2018-07-23 NOTE — Telephone Encounter (Signed)
Patient notified of PSA results

## 2018-08-09 ENCOUNTER — Encounter (HOSPITAL_BASED_OUTPATIENT_CLINIC_OR_DEPARTMENT_OTHER)
Admission: RE | Disposition: A | Discharge status: Home / Self Care | Payer: Self-pay | Source: Ambulatory Visit | Attending: Gastroenterology

## 2018-08-09 ENCOUNTER — Ambulatory Visit (HOSPITAL_BASED_OUTPATIENT_CLINIC_OR_DEPARTMENT_OTHER): Payer: No Typology Code available for payment source | Admitting: Anesthesiology

## 2018-08-09 ENCOUNTER — Encounter (HOSPITAL_BASED_OUTPATIENT_CLINIC_OR_DEPARTMENT_OTHER): Payer: Self-pay

## 2018-08-09 ENCOUNTER — Ambulatory Visit
Admission: RE | Admit: 2018-08-09 | Discharge: 2018-08-09 | Disposition: A | Discharge status: Home / Self Care | Payer: No Typology Code available for payment source | Source: Ambulatory Visit | Attending: Gastroenterology | Admitting: Gastroenterology

## 2018-08-09 DIAGNOSIS — Z1211 Encounter for screening for malignant neoplasm of colon: Secondary | ICD-10-CM | POA: Insufficient documentation

## 2018-08-09 DIAGNOSIS — D122 Benign neoplasm of ascending colon: Secondary | ICD-10-CM | POA: Insufficient documentation

## 2018-08-09 DIAGNOSIS — K5730 Diverticulosis of large intestine without perforation or abscess without bleeding: Secondary | ICD-10-CM | POA: Insufficient documentation

## 2018-08-09 DIAGNOSIS — D125 Benign neoplasm of sigmoid colon: Secondary | ICD-10-CM | POA: Insufficient documentation

## 2018-08-09 DIAGNOSIS — Z86010 Personal history of colonic polyps: Secondary | ICD-10-CM | POA: Insufficient documentation

## 2018-08-09 HISTORY — PX: COLONOSCOPY: SHX174

## 2018-08-09 HISTORY — DX: Atherosclerotic heart disease of native coronary artery without angina pectoris: I25.10

## 2018-08-09 SURGERY — DONT USE, USE 1094-COLONOSCOPY, DIAGNOSTIC (SCREENING)
Anesthesia: Anesthesia MAC / Sedation | Site: Anus | Wound class: Clean Contaminated

## 2018-08-09 MED ORDER — SODIUM CHLORIDE 0.9 % IV SOLN
INTRAVENOUS | Status: DC
Start: 2018-08-09 — End: 2018-08-09

## 2018-08-09 MED ORDER — FENTANYL CITRATE (PF) 50 MCG/ML IJ SOLN (WRAP)
INTRAMUSCULAR | Status: DC | PRN
Start: 2018-08-09 — End: 2018-08-09
  Administered 2018-08-09: 50 ug via INTRAVENOUS

## 2018-08-09 MED ORDER — DEXTROSE 10 % IV BOLUS
125.00 mL | Freq: Once | INTRAVENOUS | Status: DC
Start: 2018-08-09 — End: 2018-08-09

## 2018-08-09 MED ORDER — ONDANSETRON HCL 4 MG/2ML IJ SOLN
4.0000 mg | INTRAMUSCULAR | Status: DC | PRN
Start: 2018-08-09 — End: 2018-08-09

## 2018-08-09 MED ORDER — FENTANYL CITRATE (PF) 50 MCG/ML IJ SOLN (WRAP)
INTRAMUSCULAR | Status: AC
Start: 2018-08-09 — End: ?
  Filled 2018-08-09: qty 2

## 2018-08-09 MED ORDER — PROPOFOL 200 MG/20ML IV EMUL
INTRAVENOUS | Status: AC
Start: 2018-08-09 — End: ?
  Filled 2018-08-09: qty 20

## 2018-08-09 MED ORDER — PROPOFOL 200 MG/20ML IV EMUL
INTRAVENOUS | Status: DC | PRN
Start: 2018-08-09 — End: 2018-08-09
  Administered 2018-08-09: 30 mg via INTRAVENOUS
  Administered 2018-08-09: 20 mg via INTRAVENOUS
  Administered 2018-08-09: 40 mg via INTRAVENOUS
  Administered 2018-08-09: 20 mg via INTRAVENOUS
  Administered 2018-08-09: 70 mg via INTRAVENOUS
  Administered 2018-08-09: 20 mg via INTRAVENOUS

## 2018-08-09 SURGICAL SUPPLY — 31 items
BRUSH CLEANING COMBINATION (Supply) ×1
BRUSH HEDGEHOG DUAL END (Supply) ×1 IMPLANT
CATH GLD PROBE BICAP 7FX210CM (Supply) IMPLANT
CATH GLD PROBE BICAP 7FX300CM (Supply) IMPLANT
CATH GOLD PROBE 10FR (Supply) IMPLANT
CATH GOLD PROBE INJECTION 10F (Supply) IMPLANT
CLEANER ENZYMATIC BEDSIDE (Supply) ×2 IMPLANT
CLIP RESOLUTION 360 (Supply) IMPLANT
CONNECTOR QUICK PORT (Supply) ×2 IMPLANT
CRE PYLORIC COL 10-12 DIL 5847 (Supply) IMPLANT
CRE PYLORIC COL 12-15 DIL 5848 (Supply) IMPLANT
CRE PYLORIC COL 15-18 DIL 5849 (Supply) IMPLANT
CRE PYLORIC COL 8-10 DIL 5846 (Supply) IMPLANT
DEVICE STERIFLATE INFLATION (Supply) IMPLANT
DIL CRE 18-20 PYL CLN 5850 (Supply) IMPLANT
ENDOCUFF VISION LG GREEN 11.2 (Supply) IMPLANT
FORCEP BIOPSY HOT RADIAL JAW 4 (Supply) IMPLANT
FORCEP BIOPSY RAD JAW 1333-40 (Supply) ×2 IMPLANT
FORCEP RAD JAW 3 PED W/NDL (Supply) IMPLANT
FORCEP RADIAL JAW JUMBO 240CM (Supply) IMPLANT
MARKER ENDOSCOPIC SPOT (Supply) IMPLANT
NDL INTERJECT SCLERO 25G (Supply) IMPLANT
PAD CLINCH ENDO TRASPORT (Supply) ×2 IMPLANT
PROBE SIDE FIRE (Supply) IMPLANT
PROBE STRAIGHT FIRE APC (Supply) IMPLANT
SNARE ENDO CAP2 RND 10MM LOOP (Supply) ×2 IMPLANT
SNARE LARGE CAPTIV OVAL 6131 (Supply) IMPLANT
SNARE ROTATE SM OVAL MED STFF (Supply) IMPLANT
SNARE SMALL CAPTIV 6230 (Supply) IMPLANT
VALVE DISP CLEANING BIOGUARD (Supply) ×2 IMPLANT
VALVE OLYMPUS DISP A/W/S/ BIO (Supply) ×2 IMPLANT

## 2018-08-09 NOTE — Transfer of Care (Signed)
Anesthesia Transfer of Care Note    Patient: Duane Thomas    Last vitals:   Vitals:    08/09/18 1212   BP: 118/82   Pulse: 77   Resp: 14   SpO2: 97%       Oxygen: Room Air     Mental Status:awake and alert     Airway: Natural    Cardiovascular Status:  stable

## 2018-08-09 NOTE — Discharge Instr - AVS First Page (Addendum)
Endoscopy Discharge Instructions    After you leave the hospital:  1. Due to the effects of the sedatives, you may feel tired for the remainder of today.   2. We recommend you go home after discharge  3. It is advisable to have supervision or access to seek help for a few hours after discharge  4. Do not drive, operate machinery, or sign important documents until tomorrow.  5. Please rest and drink extra fluids today.   6.  Avoid alcohol today.  7. Start with light easily tolerated foods advance to a regular diet as tolerated.  8. Resume your normal activities / work tomorrow    When to seek help -     Nausea / Vomiting: - try small amounts of bland foods like crackers or toast, & liquids such as soda, electrolyte replacement drinks or ice chips. Call for:  . New onset or ongoing nausea and vomiting   . The symptoms worsen - cold skin, confusion, blood or unexplainable black appearing vomit  Bleeding:  . Passing of blood or clots  or a change in stool consistency and or black in color  . Vomiting or spitting up blood  Infection:  . Redness or swelling at the IV site that continues to worsen. Initial warm compress may help.  . Fever  Pain / other:  . New onset of pain that does not subside over time or prevents you from normal activity or eating  . Shortness of breath or breathing trouble  . Weakness, feeling faint, passing out  . Swollen or distended abdomen    During business hours call your physician's office.   After-hours call [facility] [phone] and a physician will be contacted    OBSTRUCTIVE SLEEP APNEA BREATHING RISK INFORMATION  Please read!    BREATHING PRECAUTIONS AFTER YOUR SURGERY TODAY:     Sleep with your head of bed elevated at least 30 degrees extra pillows may be used   Use pain medications sparingly    Please use any breathing machines you may have at home while sleeping   A care partner is suggested for the first 24 hours    Call 911 or seek assistance for any respiratory  distress  (i.e. fast or slow breathing that is different from your normal breathing, prolonged pauses in breathing, blue tinged lips or nailbeds, shortness of breath, or altered mental status such as confusion, sleepiness, or irritability).        Dear Patient,      Thank you for choosing [facility] for your surgery.  We care about your safety during your surgery and when you return home. Today you were screened to help us decide whether you were at risk for problems that may arise with your breathing during surgery.  We used a questionnaire to look for signs of problems you may have with breathing.  The questionnaire is only a screening tool.     Obstructive Sleep Apnea (OSA) is a common breathing disorder that may occur during sleep, during surgery, or after surgery.  Patients who have OSA can sometimes be at a higher risk of breathing complications during and after surgery because of the use of medications to relieve pain.  We use these medications to help manage your discomfort during and after surgery.  These medications may sometimes cause your breathing to slow down or cause the tissues in your throat to relax.  Because these medications can cause these things to happen, we will monitor   your breathing during and after surgery while you are in the hospital.        You have a score on your screening questionnaire that indicated you may wish to have further testing in order to look for problems that may arise with your breathing at home after surgery or when you are asleep.  Please share this letter with your primary doctor and discuss whether you may need further testing, such as a sleep study.  A sleep study is the diagnostic test for OSA.  Becoming aware that you have OSA and working with your doctor to treat OSA can help keep you safe.    Sincerely,    Nicolas C. Restrepo, MD  Vice President Medical Affairs  [facility]

## 2018-08-09 NOTE — Brief Op Note (Signed)
BRIEF OP NOTE    Date Time: 08/09/18 12:20 PM    See full colonoscopy report in [facility]

## 2018-08-09 NOTE — Anesthesia Postprocedure Evaluation (Signed)
Anesthesia Post Evaluation    Patient: Duane Thomas    Procedure(s):  COLONOSCOPY         Anesthesia type: MAC        Patient location: Recovery    Post pain: Pain appropriately addressed.     Mental Status:awake and alert     Respiratory Function: tolerating room air    Cardiovascular: stable    Nausea/Vomiting: patient not complaining of nausea or vomiting    Hydration Status: adequate    Post assessment: no apparent anesthetic complications, no reportable events.    Last vitals: BP BP: 118/82 , Pulse Heart Rate: 77, RR Resp Rate: 14, Temperature  , Pulse Oximetry   SpO2 Readings from Last 1 Encounters:   08/09/18 97%              Anesthesia Post Evaluation    Signed by: Letitia Neri, 08/09/2018 12:17 PM

## 2018-08-09 NOTE — Anesthesia Preprocedure Evaluation (Signed)
Anesthesia Evaluation    AIRWAY    Mallampati: III    TM distance: >3 FB  Neck ROM: full  Mouth Opening:full  Planned to use difficult airway equipment: No CARDIOVASCULAR    cardiovascular exam normal       DENTAL    no notable dental hx     PULMONARY    pulmonary exam normal     OTHER FINDINGS                  Relevant Problems   No relevant active problems               Anesthesia Plan    ASA 3     MAC               (Informed consent obtained and the discussed possibility of anesthesia complications including but not limited to: aspiration, medication reactions; need for intervention including use of pressors, line placement or invasive procedures.  Discussed possibility of dental damage, laryngospasm, cardiopulmonary issues, positional injuries and anesthesia options.  If MAC sedation is selected then increased possibility of recall was discussed as well as need for possible jaw thrust maneuver and residual sore jaw.   Pt voiced understanding and wished to proceed with procedure.          It should also be noted that due to the nature of the electronic medical record, some data may not be completely accurate due to artifact, inaccurate data entry by other staff, etc.       Update: Patient seen and examined the DOS and ready to proceed.  )      intravenous induction   Detailed anesthesia plan: MAC            informed consent obtained      pertinent labs reviewed             Signed by: Letitia Neri 08/09/18 11:56 AM

## 2018-08-10 ENCOUNTER — Encounter (HOSPITAL_BASED_OUTPATIENT_CLINIC_OR_DEPARTMENT_OTHER): Payer: Self-pay | Admitting: Gastroenterology

## 2018-08-26 ENCOUNTER — Emergency Department
Admission: EM | Admit: 2018-08-26 | Discharge: 2018-08-26 | Disposition: A | Discharge status: Home / Self Care | Payer: No Typology Code available for payment source | Attending: Emergency Medicine | Admitting: Emergency Medicine

## 2018-08-26 DIAGNOSIS — Z760 Encounter for issue of repeat prescription: Secondary | ICD-10-CM | POA: Insufficient documentation

## 2018-08-26 MED ORDER — LISINOPRIL 10 MG PO TABS
5.00 mg | ORAL_TABLET | Freq: Every day | ORAL | 0 refills | Status: AC
Start: 2018-08-26 — End: 2018-09-25

## 2018-08-26 NOTE — Discharge Instructions (Signed)
You were seen today for medication refill.  Contact your primary pharmacy for change in address.

## 2018-08-26 NOTE — ED Provider Notes (Signed)
ED DOC NOTE     History provided by the patient    History is limited by none    HPI     72 y.o. male  Refill onlisiniopril, visiting from [place] where [place] sends his Rx but will take 30 days to get new address  Takes lisinopril as renal protectant for DM2  Onset yesterday  Worsened by   Relieved by   Quality   Radiation   Severity     Associated symptoms     Review of Systems   Review of Systems  NO Sob  No chest pain      PAST MEDICAL/SURGICAL HISTORY:  Past Medical History:   Diagnosis Date   . Coronary artery disease     s/p CABG   . Hypertension    . Type 2 diabetes mellitus, controlled       Past Surgical History:   Procedure Laterality Date   . CARDIAC SURGERY      bypass x6   . COLONOSCOPY N/A 01/09/2015    Procedure: COLONOSCOPY;  Surgeon: Chauncey Fischer, MD;  Location: [facility];  Service: Gastroenterology;  Laterality: N/A;   . COLONOSCOPY N/A 08/09/2018    Procedure: COLONOSCOPY;  Surgeon: Chauncey Fischer, MD;  Location: [facility];  Service: Gastroenterology;  Laterality: N/A;   . left elbow     . TONSILLECTOMY       Additional Past Medical/Surgical History:       SOCIAL AND FAMILY HISTORY:  Social History     Tobacco Use   . Smoking status: Never Smoker   Substance Use Topics   . Alcohol use: No   . Drug use: No     History reviewed. No pertinent family history.     Additional Social and Family History:  Additional SH: [x]   I reviewed SH above       Additional FH: [x]  I reviewed FH above    No Known Allergies  Additional Allergies:    Prior to Admission medications    Medication Sig Start Date End Date Taking? Authorizing Provider   aspirin 81 MG chewable tablet Chew 81 mg by mouth daily.    [provider]   atorvastatin (LIPITOR) 20 MG tablet Take 20 mg by mouth daily    [provider]   Cholecalciferol (VITAMIN D) 1000 UNIT tablet Take 1,000 Units by mouth daily.    [provider]   finasteride (PROPECIA) 1 MG tablet Take 1 mg by mouth daily.    [provider]   lisinopril (PRINIVIL,ZESTRIL) 10 MG tablet Take 5 mg by mouth daily       [provider]   metFORMIN (GLUCOPHAGE) 1000 MG tablet Take 1,000 mg by mouth 2 (two) times daily with meals.    [provider]   metoprolol (TOPROL-XL) 100 MG 24 hr tablet Take 100 mg by mouth daily.    [provider]   tamsulosin (FLOMAX) 0.4 MG Cap Take 0.4 mg by mouth Daily after dinner    [provider]   UNKNOWN TO PATIENT     [provider]   VITAMIN D PO Take by mouth    [provider]       PHYSICAL EXAM   Vitals:    08/26/18 1741   BP: (!) 161/108   Pulse: 99   Resp: 19   Temp: 97.6 F (36.4 C)   SpO2:      [x]  Nursing note reviewed [x]  Vitals reviewed  Pulse Oximetry on Room Air Normal  Physical Exam  Constitutional: Well-appearing. A&O x 3   Head: Atraumatic, normocephalic   Eyes: Normal to inspection, no discharge   ENT: Normal to inspection. Neck supple   Resp/Chest: No distress   Skin: Warm, dry, pink   Neuro: No focal deficits   Psych: Normal affect. Answers questions appropriately      LABS/TESTS:  Results     ** No results found for the last 24 hours. **            No results found.          EKG:       ED COURSE:  BP (!) 161/108   Pulse 99   Temp 97.6 F (36.4 C)   Resp 19   Ht 1.753 m   Wt 95.2 kg   SpO2 97%   BMI 30.99 kg/m          PROCEDURES:        IMPRESSION:     72 y.o. male with  1. Encounter for medication refill        DIFFERENTIAL DIAGNOSIS:  Medication refill    MEDICAL DECISION MAKING:  I have evaluated all labs and imaging studies in the scope of a single Emergency Department visit and have determined that at this time an acute life threatening illness  does not exist. I have explained all results to the patient and all questions have been answered.  The patient have been given strict return instructions to return to the ER for any worsening or changing symptoms and that they should follow up with a primary care provider. The  patient was discharge home in stable condition.      PLAN:   ED Disposition     ED Disposition Condition Date/Time Comment    Discharge  Thu Aug 26, 2018  5:38 PM Drue Stager discharge to home/self care.    Condition at disposition: Stable        Discharge Medication List as of 08/26/2018  5:38 PM             Harriette Bouillon, MD  08/26/18 2219

## 2018-09-07 ENCOUNTER — Ambulatory Visit (INDEPENDENT_AMBULATORY_CARE_PROVIDER_SITE_OTHER): Payer: Medicare Other | Admitting: Urology

## 2018-09-07 ENCOUNTER — Encounter: Payer: Self-pay | Admitting: Urology

## 2018-09-07 ENCOUNTER — Other Ambulatory Visit: Payer: Self-pay | Admitting: Urology

## 2018-09-07 VITALS — BP 129/85 | HR 57 | Ht 69.0 in | Wt 197.0 lb

## 2018-09-07 DIAGNOSIS — N138 Other obstructive and reflux uropathy: Secondary | ICD-10-CM

## 2018-09-07 DIAGNOSIS — N401 Enlarged prostate with lower urinary tract symptoms: Secondary | ICD-10-CM

## 2018-09-07 DIAGNOSIS — N319 Neuromuscular dysfunction of bladder, unspecified: Secondary | ICD-10-CM | POA: Diagnosis not present

## 2018-09-07 DIAGNOSIS — R339 Retention of urine, unspecified: Secondary | ICD-10-CM | POA: Diagnosis not present

## 2018-09-07 LAB — URINALYSIS, COMPLETE
Bilirubin, UA: NEGATIVE
Glucose, UA: NEGATIVE
KETONES UA: NEGATIVE
Nitrite, UA: NEGATIVE
Protein, UA: NEGATIVE
Urobilinogen, Ur: 1 mg/dL (ref 0.2–1.0)
pH, UA: 5.5 (ref 5.0–7.5)

## 2018-09-07 LAB — MICROSCOPIC EXAMINATION
Bacteria, UA: NONE SEEN
WBC, UA: NONE SEEN /hpf (ref 0–5)

## 2018-09-07 NOTE — Progress Notes (Signed)
09/07/2018  4:54 PM   Kyle Richards Apr 27, 1946 [id number]  Referring provider: No referring provider defined for this encounter.  Chief Complaint  Patient presents with  . Review UDS    6 week followup  possible cysto    HPI: Kyle Richards is a 73 y.o. 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 He presents today to discuss his options following the results of his urodynamic study.   Urodynamics showed impaired bladder sensation, for sensation occurring at 647 mL's.  Shortly thereafter at 55, he had a desire to void and a strong desire at 722 mL's.  He had a stable low pressure bladder contraction with a detrusor pressure at peak flow of 44 cm of water.  Max flow rate was 8 mL's per second.  The voiding pattern was obstructive in appearance.  He has significantly elevated PVR of 571, only voiding to 93 mL's.  He did have intermittent EMG activity during voiding.  On fluoroscopy, he was noted to have an elevation of the bladder base noted with a smooth regular bladder wall.  No reflux was appreciated.  He did have some intermittent increase in EMG activity with voiding.  BOOI equivocal falling in the 20-40 range consistently.  No instability appreciated.  He was initially seen and evaluated by Kyle Richards (06/14/2018) after a recently diagnosed UTI. This was a staph epidermal UTI with Ceftin. This was his second documented UTI in his history.   He continues on finasteride and Flomax chronically.  He has no urinary complaints today. He admits to voiding normally with minimal bother.   He was previously seen in [facility] for kidney stones in 2013. He also had a prostate biopsy in the remote past (>10 years ago), which he recalls as returning negative.    PMH: Past Medical History:  Diagnosis Date  . Diabetes ([place])   . Heart attack ([place]) 2012  . High cholesterol   . Hypertension   . Neuropathy     Surgical  History: Past Surgical History:  Procedure Laterality Date  . FINGER FRACTURE SURGERY Left    middle finger left hand  . TONSILLECTOMY AND ADENOIDECTOMY  1953    Home Medications:  Allergies as of 09/07/2018   No Known Allergies     Medication List       Accurate as of September 07, 2018  4:54 PM. Always use your most recent med list.        aspirin 81 MG chewable tablet Chew 81 mg by mouth daily.   atorvastatin 20 MG tablet Commonly known as:  LIPITOR Take 20 mg by mouth daily.   finasteride 5 MG tablet Commonly known as:  PROSCAR Take 5 mg by mouth daily.   lisinopril 5 MG tablet Commonly known as:  PRINIVIL,ZESTRIL Take 5 mg by mouth daily.   metFORMIN 1000 MG tablet Commonly known as:  GLUCOPHAGE Take 1,000 mg by mouth daily.   metoprolol tartrate 25 MG tablet Commonly known as:  LOPRESSOR Take 25 mg by mouth daily.   tamsulosin 0.4 MG Caps capsule Commonly known as:  FLOMAX Take 1 capsule (0.4 mg total) by mouth daily.   VITAMIN D-1000 MAX ST 25 MCG (1000 UT) tablet Generic drug:  Cholecalciferol Take 1,000 Units by mouth daily.       Allergies: No Known Allergies  Family History: No family history on file.  Social History:  reports that he has never smoked. He has never used smokeless tobacco.  He reports current alcohol use. He reports that he does not use drugs.  ROS: UROLOGY Frequent Urination?: No Hard to postpone urination?: No Burning/pain with urination?: No Get up at night to urinate?: No Leakage of urine?: No Urine stream starts and stops?: No Trouble starting stream?: No Do you have to strain to urinate?: No Blood in urine?: No Urinary tract infection?: No Sexually transmitted disease?: No Injury to kidneys or bladder?: No Painful intercourse?: No Weak stream?: No Erection problems?: No Penile pain?: No  Gastrointestinal Nausea?: No Vomiting?: No Indigestion/heartburn?: No Diarrhea?: No Constipation?:  No  Constitutional Fever: No Night sweats?: No Weight loss?: No Fatigue?: No  Skin Skin rash/lesions?: No Itching?: No  Eyes Blurred vision?: No Double vision?: No  Ears/Nose/Throat Sore throat?: No Sinus problems?: No  Hematologic/Lymphatic Swollen glands?: No Easy bruising?: No  Cardiovascular Leg swelling?: No Chest pain?: No  Respiratory Cough?: No Shortness of breath?: No  Endocrine Excessive thirst?: No  Musculoskeletal Back pain?: No Joint pain?: No  Neurological Headaches?: No Dizziness?: No  Psychologic Depression?: No Anxiety?: No  Physical Exam: BP 129/85 (BP Location: Left Arm, Patient Position: Sitting)   Pulse (!) 57   Ht 5\' 9"  (1.753 m)   Wt 197 lb (89.4 kg)   BMI 29.09 kg/m   Constitutional:  Alert and oriented, No acute distress. Respiratory: Normal respiratory effort, no increased work of breathing. GU: No CVA tenderness Skin: No rashes, bruises or suspicious lesions. Neurologic: Grossly intact, no focal deficits, moving all 4 extremities. Psychiatric: Normal mood and affect.  Assessment & Plan:    1. BPH with obstruction/lower urinary tract symptoms His last PSA (07/16/2018) was 1.66 Maximal medical management with finasteride/flomax Discussed surgical options, pro v con, other than urinary tract infection, he is relatively asymptomatic without bladder stones, frank retention, or any voiding symptoms  2. 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 as well as evidence of BPH in the setting of elevated bladder neck, prostamegaly, and obstructive voiding pattern on uroflow We discussed options including self cath daily to keep his bladder empty in  order to avoid complications of incomplete bladder emptying versus consideration of an outlet procedure Risk and benefits of each were discussed We also discussed that there is no clear guarantee that an outlet procedure would resolve his incomplete bladder emptying At this point in time, you prefer to pursue conservatively with close follow-up PVR and consideration of further intervention if he develops any significant sequela We will plan to have him return with IPSS/PVR in 6 months or sooner as needed if he has difficulty voiding or signs or symptoms of retention or complications from incomplete bladder emptying He is agreeable this plan  Return in about 6 months (around 03/08/2019) for IPSS and PVR.   [place] [address], [place], [place] [address] [phone]   I, Stephania Fragmin , am acting as a scribe for Hollice Espy, MD  I have reviewed the above documentation for accuracy and completeness, and I agree with the above.   Hollice Espy, MD      I spent 25 min with this patient of which greater than 50% was spent in counseling and coordination of care with the patient.

## 2018-09-08 ENCOUNTER — Telehealth: Payer: Self-pay

## 2018-09-08 LAB — BUN+CREAT
BUN/Creatinine Ratio: 20 (ref 10–24)
BUN: 19 mg/dL (ref 8–27)
Creatinine, Ser: 0.97 mg/dL (ref 0.76–1.27)
GFR calc Af Amer: 90 mL/min/{1.73_m2} (ref >59)
GFR calc non Af Amer: 78 mL/min/{1.73_m2} (ref >59)

## 2018-09-08 NOTE — Telephone Encounter (Signed)
-----   Message from Hollice Espy, MD sent at 09/08/2018  8:23 AM EST ----- Cr is normal.  Great news.   Hollice Espy, MD

## 2018-12-22 ENCOUNTER — Other Ambulatory Visit: Payer: Self-pay

## 2018-12-22 ENCOUNTER — Ambulatory Visit (INDEPENDENT_AMBULATORY_CARE_PROVIDER_SITE_OTHER): Payer: Medicare Other | Admitting: *Deleted

## 2018-12-22 DIAGNOSIS — R339 Retention of urine, unspecified: Secondary | ICD-10-CM | POA: Diagnosis not present

## 2018-12-22 NOTE — Progress Notes (Signed)
Continuous Intermittent Catheterization  Due to incomplete bladder emptying patient is present today for a teaching of self I & O Catheterization. Patient was given detailed verbal and printed instructions of self catheterization. Patient was cleaned and prepped in a sterile fashion.  With instruction and assistance patient inserted a 16FR coude and urine return was noted 300 ml, urine was dark red in color. Patient tolerated well, no complications were noted.  Instructions were given per  Dr. Erlene Quan for patient to cath 4 times daily.   Preformed by: Blondell Reveal  Additional Notes: Supplies ordered for patient from [place]

## 2018-12-22 NOTE — Patient Instructions (Signed)

## 2018-12-23 ENCOUNTER — Encounter: Payer: Self-pay | Admitting: Urology

## 2018-12-23 ENCOUNTER — Telehealth: Payer: Self-pay | Admitting: Urology

## 2018-12-23 NOTE — Telephone Encounter (Signed)
Spoke with patient his is having light red blood with CIC. No pain or any other symptoms patient was told that this can happen with self cath and to call if symptoms worsen

## 2018-12-23 NOTE — Telephone Encounter (Signed)
Pt LMOM this a.m. having blood in urine.  Please give pt a call.

## 2018-12-26 ENCOUNTER — Emergency Department (HOSPITAL_COMMUNITY)
Admission: EM | Admit: 2018-12-26 | Discharge: 2018-12-26 | Disposition: A | Discharge status: Home / Self Care | Payer: Medicare Other | Attending: Emergency Medicine | Admitting: Emergency Medicine

## 2018-12-26 ENCOUNTER — Encounter (HOSPITAL_COMMUNITY): Payer: Self-pay | Admitting: *Deleted

## 2018-12-26 ENCOUNTER — Other Ambulatory Visit: Payer: Self-pay

## 2018-12-26 DIAGNOSIS — I10 Essential (primary) hypertension: Secondary | ICD-10-CM | POA: Insufficient documentation

## 2018-12-26 DIAGNOSIS — Z7982 Long term (current) use of aspirin: Secondary | ICD-10-CM | POA: Diagnosis not present

## 2018-12-26 DIAGNOSIS — R319 Hematuria, unspecified: Secondary | ICD-10-CM

## 2018-12-26 DIAGNOSIS — Z79899 Other long term (current) drug therapy: Secondary | ICD-10-CM | POA: Diagnosis not present

## 2018-12-26 DIAGNOSIS — R8271 Bacteriuria: Secondary | ICD-10-CM

## 2018-12-26 DIAGNOSIS — Z7984 Long term (current) use of oral hypoglycemic drugs: Secondary | ICD-10-CM | POA: Diagnosis not present

## 2018-12-26 DIAGNOSIS — E119 Type 2 diabetes mellitus without complications: Secondary | ICD-10-CM | POA: Insufficient documentation

## 2018-12-26 LAB — URINALYSIS, ROUTINE W REFLEX MICROSCOPIC: RBC / HPF: >50 RBC/hpf — ABNORMAL HIGH (ref 0–5)

## 2018-12-26 MED ORDER — CEPHALEXIN 250 MG PO CAPS
1000.0000 mg | ORAL_CAPSULE | Freq: Once | ORAL | Status: AC
  Administered 2018-12-26: 1000 mg via ORAL
  Filled 2018-12-26: qty 4

## 2018-12-26 MED ORDER — CEPHALEXIN 500 MG PO CAPS: 500.0000 mg | ORAL_CAPSULE | Freq: Four times a day (QID) | ORAL | 0 refills | Status: DC

## 2018-12-26 NOTE — ED Triage Notes (Signed)
Bleeding from foley catheter since fridayoff and on since Friday  2000 he felt like the folry was stopped up with blood clots  Bloody ujrine at present with blood clots

## 2018-12-26 NOTE — ED Notes (Signed)
Pt's urinary catheter irrigated and new leg bag applied.

## 2018-12-26 NOTE — ED Provider Notes (Signed)
[facility] EMERGENCY DEPARTMENT Provider Note   CSN: [id number] Arrival date & time: 12/26/18  0236    History   Chief Complaint Chief Complaint  Patient presents with   Hematuria    HPI Kyle Richards is a 73 y.o. male.     The history is provided by the patient.  Hematuria  This is a new problem. The current episode started more than 2 days ago. The problem occurs daily. The problem has been gradually worsening. Pertinent negatives include no chest pain, no abdominal pain, no headaches and no shortness of breath. Nothing aggravates the symptoms. Nothing relieves the symptoms. He has tried nothing for the symptoms. The treatment provided no relief.    Past Medical History:  Diagnosis Date   Diabetes ([place])    Heart attack ([place]) 2012   High cholesterol    Hypertension    Neuropathy     There are no active problems to display for this patient.   Past Surgical History:  Procedure Laterality Date   FINGER FRACTURE SURGERY Left    middle finger left hand   TONSILLECTOMY AND ADENOIDECTOMY  1953      Home Medications    Prior to Admission medications   Medication Sig Start Date End Date Taking? Authorizing Provider  aspirin 81 MG chewable tablet Chew 81 mg by mouth daily.    [provider]  atorvastatin (LIPITOR) 20 MG tablet Take 20 mg by mouth daily.    [provider]  cephALEXin (KEFLEX) 500 MG capsule Take 1 capsule (500 mg total) by mouth 4 (four) times daily. 12/26/18   Dorean Daniello, Corene Cornea, MD  Cholecalciferol (VITAMIN D-1000 MAX ST) 1000 units tablet Take 1,000 Units by mouth daily.     [provider]  finasteride (PROSCAR) 5 MG tablet Take 5 mg by mouth daily.    [provider]  lisinopril (PRINIVIL,ZESTRIL) 5 MG tablet Take 5 mg by mouth daily.    [provider]  metFORMIN (GLUCOPHAGE) 1000 MG tablet Take 1,000 mg by mouth daily.    [provider]  metoprolol tartrate (LOPRESSOR) 25  MG tablet Take 25 mg by mouth daily.    [provider]  tamsulosin (FLOMAX) 0.4 MG CAPS capsule Take 1 capsule (0.4 mg total) by mouth daily. 06/14/18   Nori Riis, PA-C    Family History No family history on file.  Social History Social History   Tobacco Use   Smoking status: Never Smoker   Smokeless tobacco: Never Used  Substance Use Topics   Alcohol use: Yes    Comment: 1 beer monthly   Drug use: Never     Allergies   Patient has no known allergies.   Review of Systems Review of Systems  Respiratory: Negative for shortness of breath.   Cardiovascular: Negative for chest pain.  Gastrointestinal: Negative for abdominal pain.  Genitourinary: Positive for hematuria.  Neurological: Negative for headaches.  All other systems reviewed and are negative.    Physical Exam Updated Vital Signs BP 124/69    Pulse 82    Temp 98.7 F (37.1 C)    Resp 16    SpO2 95%   Physical Exam Vitals signs and nursing note reviewed.  Constitutional:      Appearance: He is well-developed.  HENT:     Head: Normocephalic and atraumatic.     Nose: No congestion or rhinorrhea.     Mouth/Throat:     Mouth: Mucous membranes are dry.  Pharynx: Oropharynx is clear.  Eyes:     Extraocular Movements: Extraocular movements intact.     Conjunctiva/sclera: Conjunctivae normal.  Neck:     Musculoskeletal: Normal range of motion.  Cardiovascular:     Rate and Rhythm: Normal rate.  Pulmonary:     Effort: Pulmonary effort is normal. No respiratory distress.  Abdominal:     General: There is no distension.  Musculoskeletal: Normal range of motion.        General: No swelling or tenderness.  Skin:    General: Skin is warm and dry.  Neurological:     Mental Status: He is alert.      ED Treatments / Results  Labs (all labs ordered are listed, but only abnormal results are displayed) Labs Reviewed  URINALYSIS, ROUTINE W REFLEX MICROSCOPIC - Abnormal; Notable for  the following components:      Result Value   Color, Urine RED (*)    APPearance TURBID (*)    Glucose, UA   (*)    Value: TEST NOT REPORTED DUE TO COLOR INTERFERENCE OF URINE PIGMENT   Hgb urine dipstick   (*)    Value: TEST NOT REPORTED DUE TO COLOR INTERFERENCE OF URINE PIGMENT   Bilirubin Urine   (*)    Value: TEST NOT REPORTED DUE TO COLOR INTERFERENCE OF URINE PIGMENT   Ketones, ur   (*)    Value: TEST NOT REPORTED DUE TO COLOR INTERFERENCE OF URINE PIGMENT   Protein, ur   (*)    Value: TEST NOT REPORTED DUE TO COLOR INTERFERENCE OF URINE PIGMENT   Nitrite   (*)    Value: TEST NOT REPORTED DUE TO COLOR INTERFERENCE OF URINE PIGMENT   Leukocytes,Ua   (*)    Value: TEST NOT REPORTED DUE TO COLOR INTERFERENCE OF URINE PIGMENT   RBC / HPF >50 (*)    Bacteria, UA MANY (*)    All other components within normal limits  URINE CULTURE    EKG None  Radiology No results found.  Procedures Procedures (including critical care time)  Medications Ordered in ED Medications  cephALEXin (KEFLEX) capsule 1,000 mg (1,000 mg Oral Given 12/26/18 0102)     Initial Impression / Assessment and Plan / ED Course  I have reviewed the triage vital signs and the nursing notes.  Pertinent labs & imaging results that were available during my care of the patient were reviewed by me and considered in my medical decision making (see chart for details).  eval for UTI. Will also attempt to irrigate catheter for blood clots.   Irrigation improved the hematuria.  Did have bacteria on the urinalysis so we will treat him as this could be the cause of the hematuria in the first place.  He will follow up with urology. .  Final Clinical Impressions(s) / ED Diagnoses   Final diagnoses:  Hematuria, unspecified type  Bacteriuria    ED Discharge Orders         Ordered    cephALEXin (KEFLEX) 500 MG capsule  4 times daily     12/26/18 0442           Tersa Fotopoulos, Corene Cornea, MD 12/26/18 [phone]

## 2018-12-26 NOTE — ED Notes (Signed)
Patient verbalizes understanding of discharge instructions. Opportunity for questioning and answers were provided. Armband removed by staff, pt discharged from ED in wheelchair.  

## 2018-12-27 ENCOUNTER — Telehealth: Payer: Self-pay | Admitting: Urology

## 2018-12-27 NOTE — Telephone Encounter (Signed)
Zara Council spoke with pt 12/27/2018

## 2018-12-27 NOTE — Telephone Encounter (Signed)
Spoke with patient and he does have a Foley.  He was seen at the [facility] hospital last Friday (12/24/2018) and he told them he was having blood in his urine.  He then saw the urologist at the [facility] in [place], Mr. Vivianne Spence,  He put in a Foley catheter and irrigated him and scheduled him for a cystoscopy on Wednesday (12/29/2018).  He went to the ED yesterday because his catheter clotted off.  He was given Keflex and UA was sent for culture.  He says the urine is clearing up.  It is brownish in the tube.  He states a couple days ago it was really red.    He said that our office is more convenient, but since he is already scheduled for the cysto at the [place] he will keep that appointment.     He would like to follow up with [place] after the cysto.

## 2018-12-28 LAB — URINE CULTURE: Culture: 30000 — AB

## 2018-12-29 ENCOUNTER — Telehealth: Payer: Self-pay | Admitting: Urology

## 2018-12-29 ENCOUNTER — Telehealth: Payer: Self-pay

## 2018-12-29 NOTE — Telephone Encounter (Signed)
Post ED Visit - Positive Culture Follow-up  Culture report reviewed by antimicrobial stewardship pharmacist: [place] Team []  Elenor Quinones, Pharm.D. []  Heide Guile, Pharm.D., BCPS AQ-ID []  Parks Neptune, Pharm.D., BCPS []  Alycia Rossetti, Pharm.D., BCPS []  [place], Pharm.D., BCPS, AAHIVP []  Legrand Como, Pharm.D., BCPS, AAHIVP []  Salome Arnt, PharmD, BCPS []  Johnnette Gourd, PharmD, BCPS []  Hughes Better, PharmD, BCPS []  Leeroy Cha, PharmD []  Laqueta Linden, PharmD, BCPS []  Albertina Parr, PharmD  [place] Team []  Leodis Sias, PharmD []  Lindell Spar, PharmD []  Royetta Asal, PharmD []  Graylin Shiver, Rph []  Rema Fendt) Glennon Mac, PharmD []  Arlyn Dunning, PharmD []  Netta Cedars, PharmD []  Dia Sitter, PharmD []  Leone Haven, PharmD []  Gretta Arab, PharmD []  Theodis Shove, PharmD []  Peggyann Juba, PharmD []  Reuel Boom, PharmD D/C keflex per [place] [facility] Urologist recommedation Positive urine culture Treated with Keflex, organism sensitive to the same and no further patient follow-up is required at this time.  Genia Del 12/29/2018, 10:07 AM

## 2018-12-29 NOTE — Telephone Encounter (Signed)
Spoke with Kyle Richards concerning his cystoscopy at the [facility] today.  He states he was told he did not have bladder cancer and that his prostate was bleeding.  They changed his Foley catheter and he is to have that one in for one week.

## 2019-03-15 ENCOUNTER — Ambulatory Visit: Payer: Medicare Other | Admitting: Urology

## 2019-07-04 ENCOUNTER — Ambulatory Visit (INDEPENDENT_AMBULATORY_CARE_PROVIDER_SITE_OTHER): Payer: Medicare Other | Admitting: Physician Assistant

## 2019-07-04 ENCOUNTER — Encounter: Payer: Self-pay | Admitting: Physician Assistant

## 2019-07-04 ENCOUNTER — Other Ambulatory Visit: Payer: Self-pay

## 2019-07-04 VITALS — BP 112/67 | HR 101 | Ht 69.0 in | Wt 196.0 lb

## 2019-07-04 DIAGNOSIS — N3001 Acute cystitis with hematuria: Secondary | ICD-10-CM

## 2019-07-04 DIAGNOSIS — R339 Retention of urine, unspecified: Secondary | ICD-10-CM

## 2019-07-04 NOTE — Progress Notes (Signed)
In and Out Catheterization  Patient is present today for a I & O catheterization due to urinary frequency. Patient was cleaned and prepped in a sterile fashion with betadine and Lidocaine 2% jelly was instilled into the urethra.  A 14FR cath was inserted no complications were noted , 30ml of urine return was noted, urine was orange in color. A clean urine sample was collected for urinary frequency. Bladder was drained  And catheter was removed with out difficulty.    Preformed by: S. Levens,  CMA(AAMA)

## 2019-07-04 NOTE — Progress Notes (Signed)
07/04/2019 3:03 PM   Kyle Richards May 20, 1946 [id number]  CC: Penile stinging  HPI: Kyle Richards is a 73 y.o. male who presents today for evaluation of possible UTI. He is an established [facility] patient who last saw Dr. 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  He sought care at the [place] 4 days ago with stinging at the tip of his penis with urination.  He brings urinalysis results with him today from that visit.  UA significant for>182 WBCs/hpf, 4 RBCs/hpf, nitrites, and large leukocyte esterase. No culture data available. He was prescribed Ampicillin, dose unknown, BID.  He comes to the clinic today to follow up with concerns that his urine has not been cultured and so his may not be adequately treated for his UTI.   He CICs every other day with a SpeediCath due to his history of incomplete bladder emptying. He switched to these catheters approximately 4 months ago. He had previously had some prostatic bleeding with CIC that has resolved with these new catheters. He wonders if he should increase his frequency of CIC given that he is no longer concerned about bleeding.  Last cysto in April 2020 at the [facility]; patient reports findings of an enlarged, friable prostate with no signs of bladder cancer.  In-office UA today positive for trace-intact blood and trace protein; urine microscopy with amorphous sediment.   PMH: Past Medical History:  Diagnosis Date  . Diabetes ([place])   . Heart attack ([place]) 2012  . High cholesterol   . Hypertension   . Neuropathy     Surgical History: Past Surgical History:  Procedure Laterality Date  . FINGER FRACTURE SURGERY Left    middle finger left hand  . TONSILLECTOMY AND ADENOIDECTOMY  1953    Home Medications:  Allergies as of 07/04/2019   No Known Allergies     Medication List       Accurate as of July 04, 2019  3:03 PM. If you have any questions, ask your nurse or doctor.        aspirin 81 MG chewable tablet Chew 81 mg by mouth daily.   atorvastatin 20 MG tablet Commonly known as: LIPITOR Take 20 mg by mouth daily.   cephALEXin 500 MG capsule Commonly known as: KEFLEX Take 1 capsule (500 mg total) by mouth 4 (four) times daily.   finasteride 5 MG tablet Commonly known as: PROSCAR Take 5 mg by mouth daily.   lisinopril 5 MG tablet Commonly known as: ZESTRIL Take 5 mg by mouth daily.   metFORMIN 1000 MG tablet Commonly known as: GLUCOPHAGE Take 1,000 mg by mouth daily.   metoprolol tartrate 25 MG tablet Commonly known as: LOPRESSOR Take 25 mg by mouth daily.   tamsulosin 0.4 MG Caps capsule Commonly known as: FLOMAX Take 1 capsule (0.4 mg total) by mouth daily.   Vitamin D-1000 Max St 25 MCG (1000 UT) tablet Generic drug: Cholecalciferol Take 1,000 Units by mouth daily.       Allergies:  No Known Allergies  Family History: No family history on file.  Social History:   reports that he has never smoked. He has never used smokeless tobacco. He reports current alcohol use. He reports that he does not use drugs.  ROS: UROLOGY Frequent Urination?: No Hard to postpone urination?: No Burning/pain with urination?: No Get up at night to urinate?: No Leakage of urine?: No Urine stream starts and stops?: No Trouble starting stream?: No Do  you have to strain to urinate?: No Blood in urine?: No Urinary tract infection?: Yes Sexually transmitted disease?: No Injury to kidneys or bladder?: No Painful intercourse?: No Weak stream?: No Erection problems?: No Penile pain?: No  Gastrointestinal Nausea?: No Vomiting?: No Indigestion/heartburn?: No Diarrhea?: No Constipation?: No  Constitutional Fever: No Night sweats?: No Weight loss?: No Fatigue?: No  Skin Skin rash/lesions?: No Itching?: No  Eyes Blurred vision?: No Double vision?: No  Ears/Nose/Throat Sore throat?: No Sinus problems?: No  Hematologic/Lymphatic  Swollen glands?: No Easy bruising?: No  Cardiovascular Leg swelling?: No Chest pain?: No  Respiratory Cough?: No Shortness of breath?: No  Endocrine Excessive thirst?: No  Musculoskeletal Back pain?: No Joint pain?: No  Neurological Headaches?: No Dizziness?: No  Psychologic Depression?: No Anxiety?: No  Physical Exam: BP 112/67   Pulse (!) 101   Ht 5\' 9"  (1.753 m)   Wt 196 lb (88.9 kg)   BMI 28.94 kg/m   Constitutional:  Alert and oriented, no acute distress, nontoxic appearing HEENT: [place], AT Cardiovascular: No clubbing, cyanosis, or edema Respiratory: Normal respiratory effort, no increased work of breathing, occasional dry cough Skin: No rashes, bruises or suspicious lesions Neurologic: Grossly intact, no focal deficits, moving all 4 extremities Psychiatric: Normal mood and affect  Laboratory Data: Results for orders placed or performed in visit on 07/04/19  Microscopic Examination   URINE  Result Value Ref Range   WBC, UA 0-5 0 - 5 /hpf   RBC 0-2 0 - 2 /hpf   Epithelial Cells (non renal) 0-10 0 - 10 /hpf   Renal Epithel, UA 0-10 (A) None seen /hpf   Crystals Present (A) N/A   Crystal Type Amorphous Sediment N/A   Bacteria, UA Few None seen/Few  Urinalysis, Complete  Result Value Ref Range   Specific Gravity, UA >1.030 (H) 1.005 - 1.030   pH, UA 5.0 5.0 - 7.5   Color, UA Orange Yellow   Appearance Ur Hazy (A) Clear   Leukocytes,UA Negative Negative   Protein,UA Trace (A) Negative/Trace   Glucose, UA Negative Negative   Ketones, UA Negative Negative   RBC, UA Trace (A) Negative   Bilirubin, UA Negative Negative   Urobilinogen, Ur 0.2 0.2 - 1.0 mg/dL   Nitrite, UA Negative Negative   Microscopic Examination See below:    Assessment & Plan:   1. Incomplete bladder emptying Counseled patient to increase frequency of CIC to daily given resolution of prostatic bleeding with change of catheters. I believe this may reduce his frequency of UTI  secondary to reduced urinary stasis. Provided patient with a urinal today so that he can keep track of voided volumes.  2. Acute cystitis with hematuria Patient reports improvement of symptoms following 4 days of ampicillin. UA reassuring today for inappropriate therapy and notable resolution of MH. Will send for culture to confirm.  I counseled patient that cultures may take several days to result, so he may have a urine culture at the [place] still pending. I advised him to continue to monitor his lab results with the [place] online and contact me via telephone or MyChart if he learns of any results. In the meantime, I advised him to continue his prescribed ampicillin as it appears appropriate given improvement of UA since initiation of therapy. - Urinalysis, Complete - CULTURE, URINE COMPREHENSIVE   Return if symptoms worsen or fail to improve.  Debroah Loop, PA-C  [facility] [address] [place], [place] [address] [phone]

## 2019-07-05 LAB — URINALYSIS, COMPLETE
Bilirubin, UA: NEGATIVE
Glucose, UA: NEGATIVE
Ketones, UA: NEGATIVE
Leukocytes,UA: NEGATIVE
Nitrite, UA: NEGATIVE
Specific Gravity, UA: >1.03 — ABNORMAL HIGH (ref 1.005–1.030)
Urobilinogen, Ur: 0.2 mg/dL (ref 0.2–1.0)
pH, UA: 5 (ref 5.0–7.5)

## 2019-07-05 LAB — MICROSCOPIC EXAMINATION

## 2019-07-07 ENCOUNTER — Emergency Department (HOSPITAL_COMMUNITY)
Admission: EM | Admit: 2019-07-07 | Discharge: 2019-07-07 | Disposition: A | Discharge status: Home / Self Care | Payer: Medicare Other | Attending: Emergency Medicine | Admitting: Emergency Medicine

## 2019-07-07 ENCOUNTER — Encounter (HOSPITAL_COMMUNITY): Payer: Self-pay | Admitting: Emergency Medicine

## 2019-07-07 ENCOUNTER — Other Ambulatory Visit: Payer: Self-pay

## 2019-07-07 DIAGNOSIS — Z951 Presence of aortocoronary bypass graft: Secondary | ICD-10-CM | POA: Diagnosis not present

## 2019-07-07 DIAGNOSIS — I10 Essential (primary) hypertension: Secondary | ICD-10-CM | POA: Diagnosis not present

## 2019-07-07 DIAGNOSIS — E119 Type 2 diabetes mellitus without complications: Secondary | ICD-10-CM | POA: Insufficient documentation

## 2019-07-07 DIAGNOSIS — Z79899 Other long term (current) drug therapy: Secondary | ICD-10-CM | POA: Diagnosis not present

## 2019-07-07 DIAGNOSIS — Z7982 Long term (current) use of aspirin: Secondary | ICD-10-CM | POA: Diagnosis not present

## 2019-07-07 DIAGNOSIS — I252 Old myocardial infarction: Secondary | ICD-10-CM | POA: Insufficient documentation

## 2019-07-07 DIAGNOSIS — R55 Syncope and collapse: Secondary | ICD-10-CM | POA: Diagnosis present

## 2019-07-07 DIAGNOSIS — Z7984 Long term (current) use of oral hypoglycemic drugs: Secondary | ICD-10-CM | POA: Insufficient documentation

## 2019-07-07 DIAGNOSIS — I2510 Atherosclerotic heart disease of native coronary artery without angina pectoris: Secondary | ICD-10-CM | POA: Diagnosis not present

## 2019-07-07 LAB — CBC
HCT: 44.7 % (ref 39.0–52.0)
Hemoglobin: 14.8 g/dL (ref 13.0–17.0)
MCH: 29.8 pg (ref 26.0–34.0)
MCHC: 33.1 g/dL (ref 30.0–36.0)
MCV: 89.9 fL (ref 80.0–100.0)
Platelets: 270 10*3/uL (ref 150–400)
RBC: 4.97 MIL/uL (ref 4.22–5.81)
RDW: 13.1 % (ref 11.5–15.5)
WBC: 6.6 10*3/uL (ref 4.0–10.5)
nRBC: 0 % (ref 0.0–0.2)

## 2019-07-07 LAB — BASIC METABOLIC PANEL
Anion gap: 11 (ref 5–15)
BUN: 12 mg/dL (ref 8–23)
CO2: 23 mmol/L (ref 22–32)
Calcium: 9.3 mg/dL (ref 8.9–10.3)
Chloride: 103 mmol/L (ref 98–111)
Creatinine, Ser: 1.12 mg/dL (ref 0.61–1.24)
GFR calc Af Amer: >60 mL/min (ref >60)
GFR calc non Af Amer: >60 mL/min (ref >60)
Glucose, Bld: 177 mg/dL — ABNORMAL HIGH (ref 70–99)
Potassium: 4.4 mmol/L (ref 3.5–5.1)
Sodium: 137 mmol/L (ref 135–145)

## 2019-07-07 LAB — CULTURE, URINE COMPREHENSIVE

## 2019-07-07 LAB — CBG MONITORING, ED: Glucose-Capillary: 162 mg/dL — ABNORMAL HIGH (ref 70–99)

## 2019-07-07 MED ORDER — MECLIZINE HCL 25 MG PO TABS
25.0000 mg | ORAL_TABLET | Freq: Once | ORAL | Status: AC
  Administered 2019-07-07: 17:00:00 25 mg via ORAL
  Filled 2019-07-07: qty 1

## 2019-07-07 MED ORDER — LACTATED RINGERS IV BOLUS
500.0000 mL | Freq: Once | INTRAVENOUS | Status: AC
  Administered 2019-07-07: 500 mL via INTRAVENOUS

## 2019-07-07 MED ORDER — SODIUM CHLORIDE 0.9% FLUSH: 3.0000 mL | Freq: Once | INTRAVENOUS | Status: DC

## 2019-07-07 NOTE — ED Notes (Signed)
Pt ambulated well. Pt denied dizziness or any other symptoms while ambulating.

## 2019-07-07 NOTE — ED Provider Notes (Signed)
[place] EMERGENCY DEPARTMENT Provider Note   CSN: [id number] Arrival date & time: 07/07/19  1207     History   Chief Complaint Chief Complaint  Patient presents with  . Near Syncope    HPI Kyle Richards is a 73 y.o. male.     The history is provided by the patient.  Dizziness Quality:  Unable to specify Onset quality:  Sudden Duration: 10 minutes. Timing:  Constant Progression:  Resolved Chronicity:  New Context: standing up   Relieved by:  Being still Worsened by:  Standing up Ineffective treatments:  None tried Associated symptoms: nausea   Associated symptoms: no chest pain, no diarrhea, no headaches, no hearing loss, no palpitations, no shortness of breath, no syncope, no tinnitus, no vision changes and no vomiting   Risk factors: no hx of vertigo and no new medications    Patient reports that he got up to make breakfast earlier today when he felt dizzy.  He has not eaten since noon yesterday because he has not had an appetite for his own cooking.  He states this is the norm for the last 1 year.  He was diagnosed with a UTI last week, and he is currently completing a course of ampicillin.  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  Past Medical History:  Diagnosis Date  . Diabetes ([place])   . Heart attack ([place]) 2012  . High cholesterol   . Hypertension   . Neuropathy     There are no active problems to display for this patient.   Past Surgical History:  Procedure Laterality Date  . FINGER FRACTURE SURGERY Left    middle finger left hand  . TONSILLECTOMY AND ADENOIDECTOMY  1953        Home Medications    Prior to Admission medications   Medication Sig Start Date End Date Taking? Authorizing Provider  aspirin 81 MG chewable tablet Chew 81 mg by mouth daily.    [provider]  atorvastatin (LIPITOR) 20 MG tablet Take 20 mg by mouth daily.    [provider]  cephALEXin  (KEFLEX) 500 MG capsule Take 1 capsule (500 mg total) by mouth 4 (four) times daily. 12/26/18   Mesner, Corene Cornea, MD  Cholecalciferol (VITAMIN D-1000 MAX ST) 1000 units tablet Take 1,000 Units by mouth daily.     [provider]  finasteride (PROSCAR) 5 MG tablet Take 5 mg by mouth daily.    [provider]  lisinopril (PRINIVIL,ZESTRIL) 5 MG tablet Take 5 mg by mouth daily.    [provider]  metFORMIN (GLUCOPHAGE) 1000 MG tablet Take 1,000 mg by mouth daily.    [provider]  metoprolol tartrate (LOPRESSOR) 25 MG tablet Take 25 mg by mouth daily.    [provider]  tamsulosin (FLOMAX) 0.4 MG CAPS capsule Take 1 capsule (0.4 mg total) by mouth daily. 06/14/18   Nori Riis, PA-C    Family History No family history on file.  Social History Social History   Tobacco Use  . Smoking status: Never Smoker  . Smokeless tobacco: Never Used  Substance Use Topics  . Alcohol use: Yes    Comment: 1 beer monthly  . Drug use: Never     Allergies   Patient has no known allergies.   Review of Systems Review of Systems  Constitutional: Negative for activity change, appetite change (baseline decreased for one year) and fever.  HENT: Negative  for hearing loss and tinnitus.   Respiratory: Negative for shortness of breath.   Cardiovascular: Negative for chest pain, palpitations and syncope.  Gastrointestinal: Positive for nausea. Negative for diarrhea and vomiting.  Neurological: Positive for dizziness. Negative for headaches.  All other systems reviewed and are negative.    Physical Exam Updated Vital Signs BP 125/65   Pulse 65   Temp 97.7 F (36.5 C) (Oral)   Resp (!) 23   Ht 5\' 9"  (1.753 m)   Wt 85.3 kg   SpO2 98%   BMI 27.76 kg/m   Physical Exam Vitals signs and nursing note reviewed.  Constitutional:      Appearance: Normal appearance. He is well-developed. He is not ill-appearing.  HENT:     Head: Normocephalic and  atraumatic.  Eyes:     Conjunctiva/sclera: Conjunctivae normal.  Neck:     Musculoskeletal: Neck supple.  Cardiovascular:     Rate and Rhythm: Normal rate and regular rhythm.     Heart sounds: No murmur.  Pulmonary:     Effort: Pulmonary effort is normal. No respiratory distress.     Breath sounds: Normal breath sounds.  Abdominal:     General: There is no distension.     Palpations: Abdomen is soft.     Tenderness: There is no abdominal tenderness. There is no guarding.  Skin:    General: Skin is warm and dry.     Capillary Refill: Capillary refill takes less than 2 seconds.  Neurological:     General: No focal deficit present.     Mental Status: He is alert and oriented to person, place, and time.     Cranial Nerves: No cranial nerve deficit.     Sensory: No sensory deficit.     Motor: No weakness.     Coordination: Coordination normal.     Gait: Gait normal.     Comments: Normal gait, does not drop his oxygen saturations when ambulating  Psychiatric:        Mood and Affect: Mood normal.        Behavior: Behavior normal.      ED Treatments / Results  Labs (all labs ordered are listed, but only abnormal results are displayed) Labs Reviewed  BASIC METABOLIC PANEL - Abnormal; Notable for the following components:      Result Value   Glucose, Bld 177 (*)    All other components within normal limits  CBG MONITORING, ED - Abnormal; Notable for the following components:   Glucose-Capillary 162 (*)    All other components within normal limits  CBC  URINALYSIS, ROUTINE W REFLEX MICROSCOPIC    EKG None  Radiology No results found.  Procedures Procedures (including critical care time)  Medications Ordered in ED Medications  sodium chloride flush (NS) 0.9 % injection 3 mL (has no administration in time range)  lactated ringers bolus 500 mL (0 mLs Intravenous Stopped 07/07/19 1650)  meclizine (ANTIVERT) tablet 25 mg (25 mg Oral Given 07/07/19 1655)     Initial  Impression / Assessment and Plan / ED Course  I have reviewed the triage vital signs and the nursing notes.  Pertinent labs & imaging results that were available during my care of the patient were reviewed by me and considered in my medical decision making (see chart for details).        Kyle Richards is a 73 y.o. male with a past medical history of CAD and former bypass presents today for near syncope.  Patient  does not have a history of vertigo, but this does appear to have similar presentation of vertigo.  Labs ordered to evaluate for sepsis, electrolyte abnormality, anemia.  Labs do not show any acute abnormality.  Normal saline bolus ordered, meclizine ordered.  Patient is currently asymptomatic, and after medications he is still asymptomatic.  He is ambulated without difficulty and without return of nausea/dizziness.  Doubt CVA at this time considering no focal deficits and no return of symptoms.  Patient is instructed to complete his antibiotic course.  A UA was unable to be obtained in the ED, but his previous urine culture was actually negative.  Doubt sepsis at this time.  Patient is stable for discharge at this time with close outpatient follow-up that is recommended within the next week.  Care of patient was discussed with the supervising attending.  Final Clinical Impressions(s) / ED Diagnoses   Final diagnoses:  Near syncope    ED Discharge Orders    None       Julianne Rice, MD 07/07/19 1900    Lajean Saver, MD 07/07/19 2311

## 2019-07-07 NOTE — ED Triage Notes (Addendum)
Pt stood up and felt like he was going to pass out. Pt feels dizzy. Pt has been on ampicillin since last week for UTI. Denies CP. Pt states he self caths. Also- he has not eaten in two days. States he just has not felt like it.

## 2019-09-07 ENCOUNTER — Encounter (INDEPENDENT_AMBULATORY_CARE_PROVIDER_SITE_OTHER): Payer: Self-pay

## 2019-09-07 ENCOUNTER — Ambulatory Visit (INDEPENDENT_AMBULATORY_CARE_PROVIDER_SITE_OTHER): Payer: No Typology Code available for payment source | Admitting: Family

## 2019-09-07 ENCOUNTER — Ambulatory Visit (INDEPENDENT_AMBULATORY_CARE_PROVIDER_SITE_OTHER)
Admission: RE | Admit: 2019-09-07 | Discharge: 2019-09-07 | Disposition: A | Discharge status: Home / Self Care | Payer: No Typology Code available for payment source | Source: Ambulatory Visit | Attending: Family | Admitting: Family

## 2019-09-07 VITALS — BP 158/88 | HR 84 | Temp 97.0°F | Resp 18 | Ht 69.0 in | Wt 185.0 lb

## 2019-09-07 DIAGNOSIS — M79644 Pain in right finger(s): Secondary | ICD-10-CM

## 2019-09-07 NOTE — Patient Instructions (Signed)
Osteoarthritis  What is osteoarthritis?  Arthritis is a condition that causes pain and inflammation in joints. There are about 100 types of arthritis.Osteoarthritis (OA) is the most common kind. It is a long-term, (chronic), degenerative joint disease. Degenerative means that it gets worse over time. It affects mostly middle-aged and older adults. OA causes the breakdown of joint cartilage. It can occur in any joint. But it most often affects the hands, knees, hips, or spine.  What causes osteoarthritis?  OA can be called primary or secondary. Primary OA has no known cause. Secondary OA is caused by another disease, infection, injury, or deformity. OA starts with the breakdown of cartilage in the joint. As the cartilage wears down, the bone ends may thicken and form bony growths. These growths are called bone spurs. Bone spurs can limit joint movement. Bits of bone and cartilage may float in the joint space. Fluid-filled cysts may form in the bone. These can also limit joint movement.  Who is at risk for osteoarthritis?  The risk factors of OA include:   Heredity.Some genetic problems may lead to OA. These include slight joint defects or joints that are too loose(laxity).   Extra weight.Being overweight or obese can put stress on such joints as the knees over time.   Injury or overuse.Severe injury to a joint, such as the knee, can lead to OA. Injury may also result from overuse or misuse over time.  What are the symptoms of osteoarthritis?  The most common symptom of OA is pain after overuse or inactivity of a joint. Symptoms usually happen slowly over years. Symptoms can occur a bit differently in each person. They may include:   Joint pain   Joint stiffness, especially after sleep or inactivity   Less movement in the joint over time   A grinding feeling in the joint when moved, as the cartilage wears away (in more advanced stages)  The symptoms of OA can be like other health conditions. Make sure to  see yourhealthcare provider for a diagnosis.  How is osteoarthritis diagnosed?  The process starts with a health history and a physical exam. You may also have X-rays. This test uses a small amount of radiation to create images of bone and other body tissues.  How is osteoarthritis treated?  Treatment will depend on your symptoms, age, and general health. It will also depend on how severe the condition is. The goal of treatment is to ease joint pain and stiffness, and improve joint movement. Treatment may include:   Exercise.Regular exercise may help ease pain and other symptoms. This may include stretching and strength exercises.   Heat treatment.Treating the joint with heat may help ease pain.   Physical and occupational therapy.These types of therapy may help to ease joint pain, improve joint flexibility, and reduce joint strain. You may use splints and other assistive devices.   Weight maintenance.Keeping a healthy weight or losing weight if needed may help to prevent or ease symptoms.   Medicines.These may include pain relievers and anti-inflammatory medicines. You might take these by mouth as a pill. Or you may rub them on your skin in a cream.   Injections of thick liquids into the joints.These liquids mimic normal joint fluid.   Joint surgery.You may need surgery to repair or replace a joint that has severe damage.  Talk with your healthcare providers about the risks, benefits, and possible side effects of all treatments.  What are possible complications of osteoarthritis?  Because OA causes joints   to get worse over time, it can cause disability. It can cause pain and movement problems. These can make you less able to do normal daily activities and tasks.  Living with osteoarthritis  Although there is no cure for OA, it is important to help keep joints functioning. You can ease pain and inflammation. Work on a treatment plan with your healthcare provider. The plan may include medicine and  therapy. Work on lifestyle changes that can improve your quality of life. These may include:   Losing weight.Extra weight puts more stress on weight-bearing joints, such as the hips and knees.   Exercising.Some exercises may help ease joint pain and stiffness. These include swimming, walking, low-impact aerobic exercise, and range-of-motion exercises. Stretching exercises may also help keep the joints flexible.   Balancing activity and rest.To reduce stress on your joints, alternate between activity and rest. This can help protect your joints and ease your symptoms.   Using assistive devices.Canes, crutches, and walkers can help to keep stress off certain joints and improve balance.   Using adaptive equipment.Reachers and grabbers allow you to extend your reach and reduce straining. Dressing aids help people get dressed more easily.   Managing use of medicines.Long-term use of some anti-inflammatory medicines can lead to stomach bleeding. Work with your healthcare provider to develop a plan to reduce this risk.  When should I call my healthcare provider?  If your symptoms get worse or you have new symptoms, let your healthcare provider know.  Key points about osteoarthritis   Osteoarthritis is a chronic joint disease. It affects mostly middle-aged and older adults.   It starts with the breakdown of joint cartilage.   Risk factors include heredity, obesity, injury, and overuse.   Common symptoms include pain, stiffness, and limited movement of joints.   The goals of treatment are to reduce joint pain and stiffness, and improve joint movement.   Treatment may include medicines, exercise, heat, and joint injections.   Surgery may be needed to repair or replace a severely damaged joint.    Next steps  Tips to help you get the most from a visit to your healthcare provider:   Know the reason for your visit and what you want to happen.   Before your visit, write down questions you want  answered.   Bring someone with you to help you ask questions and remember what your healthcare provider tells you.   At the visit, write down the name of a new diagnosis, and any new medicines, treatments, or tests. Also write down any new instructions your provider gives you.   Know why a new medicine or treatment is prescribed, and how it will help you. Also know what the side effects are.   Ask if your condition can be treated in other ways.   Know why a test or procedure is recommended and what the results could mean.   Know what to expect if you do not take the medicine or have the test or procedure.   If you have a follow-up appointment, write down the date, time, and purpose for that visit.   Know how you can contact your healthcare provider if you have questions.  [employer] last reviewed this educational content on 04/01/2017     2000-2020 [employer] [address], [place] [address]. All rights reserved. This information is not intended as a substitute for professional medical care. Always follow your healthcare professional's instructions.

## 2019-09-07 NOTE — Progress Notes (Signed)
Subjective:    Patient ID: Duane Thomas is a 74 y.o. male.    HPI    Personal Protective Equipment (PPE)    Gloves and surgical mask.      Right thumb pain that started approximately 4 weeks ago.  He denies any known injury.  He reports pain at the proximal right thumb and a clicking and popping sound in his joint.  He reports that he is typically double-jointed in the right thumb.  He reports that the pain has not changed in the last month, he has mild swelling.  He reports that the pain and stiffness improves in the mornings and is worse throughout the day.  No redness, no fevers, no chills no recent insect bites.  No other joint pain or swelling.  He is concerned about fracture.     The following portions of the patient's history were reviewed and updated as appropriate: allergies, current medications, past family history, past medical history, past social history, past surgical history and problem list.    Review of Systems   Constitutional: Negative for chills and fever.   Musculoskeletal: Positive for myalgias. Negative for back pain.   Skin: Negative for rash.   Psychiatric/Behavioral: Negative for confusion.         Objective:    BP 158/88    Pulse 84    Temp 97 F (36.1 C) (Oral)    Resp 18    Ht 1.753 m (5\' 9" )    Wt 83.9 kg (185 lb)    BMI 27.32 kg/m     Physical Exam  Constitutional:       Appearance: He is well-developed.   HENT:      Head: Normocephalic.   Eyes:      Conjunctiva/sclera: Conjunctivae normal.   Cardiovascular:      Rate and Rhythm: Normal rate.   Pulmonary:      Effort: Pulmonary effort is normal.   Musculoskeletal:      Right hand: He exhibits tenderness (proximal right thumb) and swelling (mild). Normal sensation noted. Normal strength noted.   Skin:     General: Skin is warm.   Neurological:      Mental Status: He is alert and oriented to person, place, and time.      GCS: GCS eye subscore is 4. GCS verbal subscore is 5. GCS motor subscore is 6.           Assessment and Plan:          1. Pain of right thumb  Xr Finger(s) Right Minimum 2 View    Result Date: 09/07/2019  No fractures or bony lesions are identified. Mild degenerative changes in the first carpometacarpal joint. ReadingStation:[email or web address]    Patient placed in thumb spica wrist splint.  Recommend follow-up with orthopedics.  He reports that he has an appointment with an orthopedist on January 16th.  Encouraged him to keep follow-up appointment. Discussed use of splint.   - XR Finger(s) Right Minimum 2 View  - Ambulatory referral to Orthopedic Surgery      Renita Papa, NP  [facility]  09/07/2019  2:24 PM

## 2019-09-07 NOTE — Progress Notes (Signed)
Large thumb spica applied to pt right hand. No further questions at this time [employer]

## 2019-09-10 ENCOUNTER — Telehealth (INDEPENDENT_AMBULATORY_CARE_PROVIDER_SITE_OTHER): Payer: Self-pay

## 2019-09-10 NOTE — Telephone Encounter (Signed)
Courtesy call made; patient's condition has improved. Expressed no questions or concerns at this time.

## 2020-01-27 ENCOUNTER — Telehealth: Payer: Self-pay

## 2020-01-27 NOTE — Telephone Encounter (Signed)
Records received and scanned into the chart under the 05.28.2021 scan only encounter.

## 2020-01-29 ENCOUNTER — Other Ambulatory Visit: Payer: Self-pay

## 2020-01-29 ENCOUNTER — Emergency Department (HOSPITAL_COMMUNITY)
Admission: EM | Admit: 2020-01-29 | Discharge: 2020-01-29 | Disposition: A | Discharge status: Home / Self Care | Payer: No Typology Code available for payment source | Attending: Emergency Medicine | Admitting: Emergency Medicine

## 2020-01-29 ENCOUNTER — Encounter (HOSPITAL_COMMUNITY): Payer: Self-pay | Admitting: Emergency Medicine

## 2020-01-29 DIAGNOSIS — R300 Dysuria: Secondary | ICD-10-CM | POA: Insufficient documentation

## 2020-01-29 DIAGNOSIS — Z7984 Long term (current) use of oral hypoglycemic drugs: Secondary | ICD-10-CM | POA: Insufficient documentation

## 2020-01-29 DIAGNOSIS — Z79899 Other long term (current) drug therapy: Secondary | ICD-10-CM | POA: Insufficient documentation

## 2020-01-29 DIAGNOSIS — I252 Old myocardial infarction: Secondary | ICD-10-CM | POA: Diagnosis not present

## 2020-01-29 DIAGNOSIS — Z7982 Long term (current) use of aspirin: Secondary | ICD-10-CM | POA: Insufficient documentation

## 2020-01-29 DIAGNOSIS — I10 Essential (primary) hypertension: Secondary | ICD-10-CM | POA: Diagnosis not present

## 2020-01-29 DIAGNOSIS — E119 Type 2 diabetes mellitus without complications: Secondary | ICD-10-CM | POA: Diagnosis not present

## 2020-01-29 LAB — BASIC METABOLIC PANEL
Anion gap: 10 (ref 5–15)
BUN: 9 mg/dL (ref 8–23)
CO2: 23 mmol/L (ref 22–32)
Calcium: 9.1 mg/dL (ref 8.9–10.3)
Chloride: 106 mmol/L (ref 98–111)
Creatinine, Ser: 1.09 mg/dL (ref 0.61–1.24)
GFR calc Af Amer: >60 mL/min (ref >60)
GFR calc non Af Amer: >60 mL/min (ref >60)
Glucose, Bld: 134 mg/dL — ABNORMAL HIGH (ref 70–99)
Potassium: 4 mmol/L (ref 3.5–5.1)
Sodium: 139 mmol/L (ref 135–145)

## 2020-01-29 LAB — URINALYSIS, ROUTINE W REFLEX MICROSCOPIC
Bacteria, UA: NONE SEEN
Bilirubin Urine: NEGATIVE
Glucose, UA: NEGATIVE mg/dL
Hgb urine dipstick: NEGATIVE
Ketones, ur: NEGATIVE mg/dL
Nitrite: NEGATIVE
Protein, ur: NEGATIVE mg/dL
Specific Gravity, Urine: 1.019 (ref 1.005–1.030)
pH: 5 (ref 5.0–8.0)

## 2020-01-29 LAB — CBC
HCT: 42.4 % (ref 39.0–52.0)
Hemoglobin: 14.1 g/dL (ref 13.0–17.0)
MCH: 29.8 pg (ref 26.0–34.0)
MCHC: 33.3 g/dL (ref 30.0–36.0)
MCV: 89.6 fL (ref 80.0–100.0)
Platelets: 255 10*3/uL (ref 150–400)
RBC: 4.73 MIL/uL (ref 4.22–5.81)
RDW: 12.3 % (ref 11.5–15.5)
WBC: 6.3 10*3/uL (ref 4.0–10.5)
nRBC: 0 % (ref 0.0–0.2)

## 2020-01-29 MED ORDER — CEPHALEXIN 500 MG PO CAPS: 500.0000 mg | ORAL_CAPSULE | Freq: Three times a day (TID) | ORAL | 0 refills | Status: AC

## 2020-01-29 NOTE — ED Triage Notes (Signed)
Patient has neurogenic bladder states has to intermittent cath himself. Seen his doctor last week referred to urologist however next appointment in August in [place]. States also has urinary frequency however unable to urinate.

## 2020-01-29 NOTE — ED Notes (Signed)
Patient verbalizes understanding of discharge instructions. Opportunity for questioning and answers were provided. Armband removed by staff, pt discharged from ED to home via POV  

## 2020-01-29 NOTE — Discharge Instructions (Addendum)
The urine sample did not show a definite infection but I have prescribed antibiotics considering her symptoms.  We will call you if there are abnormal urine culture results.  Follow-up with urologist as planned

## 2020-01-29 NOTE — ED Provider Notes (Signed)
[facility] EMERGENCY DEPARTMENT Provider Note   CSN: [id number] Arrival date & time: 01/29/20  1548     History Chief Complaint  Patient presents with  . Hematuria  . Urinary Frequency    Kyle Richards is a 74 y.o. male.  HPI   Pt states he had a Physical Exam recently and was told he had some blood in his urine.  He was sent a referral to a urologist.  He was told the appointment would be in august.  Pt does have neurogenic bladder and has noticed he has had some difficulty with more use of the catheter.  He is worried that he might have an infection.  No fevers.  No vomiting.  Past Medical History:  Diagnosis Date  . Diabetes ([place])   . Heart attack ([place]) 2012  . High cholesterol   . Hypertension   . Neuropathy     There are no problems to display for this patient.   Past Surgical History:  Procedure Laterality Date  . FINGER FRACTURE SURGERY Left    middle finger left hand  . TONSILLECTOMY AND ADENOIDECTOMY  1953       No family history on file.  Social History   Tobacco Use  . Smoking status: Never Smoker  . Smokeless tobacco: Never Used  Substance Use Topics  . Alcohol use: Yes    Comment: 1 beer monthly  . Drug use: Never    Home Medications Prior to Admission medications   Medication Sig Start Date End Date Taking? Authorizing Provider  aspirin 81 MG chewable tablet Chew 81 mg by mouth daily.    [provider]  atorvastatin (LIPITOR) 20 MG tablet Take 20 mg by mouth daily.    [provider]  cephALEXin (KEFLEX) 500 MG capsule Take 1 capsule (500 mg total) by mouth 3 (three) times daily for 5 days. 01/29/20 02/03/20  Dorie Rank, MD  Cholecalciferol (VITAMIN D-1000 MAX ST) 1000 units tablet Take 1,000 Units by mouth daily.     [provider]  finasteride (PROSCAR) 5 MG tablet Take 5 mg by mouth daily.    [provider]  lisinopril (PRINIVIL,ZESTRIL) 5 MG tablet Take 5 mg by mouth daily.     [provider]  metFORMIN (GLUCOPHAGE) 1000 MG tablet Take 1,000 mg by mouth daily.    [provider]  metoprolol tartrate (LOPRESSOR) 25 MG tablet Take 25 mg by mouth daily.    [provider]  tamsulosin (FLOMAX) 0.4 MG CAPS capsule Take 1 capsule (0.4 mg total) by mouth daily. 06/14/18   Zara Council A, PA-C    Allergies    Patient has no known allergies.  Review of Systems   Review of Systems  All other systems reviewed and are negative.   Physical Exam Updated Vital Signs BP (!) 152/92 (BP Location: Right Arm)   Pulse 75   Temp 99.1 F (37.3 C) (Oral)   Resp 16   Ht 1.753 m (5\' 9" )   Wt 89.4 kg   SpO2 98%   BMI 29.09 kg/m   Physical Exam Vitals and nursing note reviewed.  Constitutional:      General: He is not in acute distress.    Appearance: He is well-developed.  HENT:     Head: Normocephalic and atraumatic.     Right Ear: External ear normal.     Left Ear: External ear normal.  Eyes:     General: No scleral icterus.  Right eye: No discharge.        Left eye: No discharge.     Conjunctiva/sclera: Conjunctivae normal.  Neck:     Trachea: No tracheal deviation.  Cardiovascular:     Rate and Rhythm: Normal rate and regular rhythm.  Pulmonary:     Effort: Pulmonary effort is normal. No respiratory distress.     Breath sounds: Normal breath sounds. No stridor. No wheezing or rales.  Abdominal:     General: Bowel sounds are normal. There is no distension.     Palpations: Abdomen is soft.     Tenderness: There is no abdominal tenderness. There is no guarding or rebound.  Musculoskeletal:        General: No tenderness.     Cervical back: Neck supple.  Skin:    General: Skin is warm and dry.     Findings: No rash.  Neurological:     Mental Status: He is alert.     Cranial Nerves: No cranial nerve deficit (no facial droop, extraocular movements intact, no slurred speech).     Sensory: No sensory deficit.     Motor: No  abnormal muscle tone or seizure activity.     Coordination: Coordination normal.     ED Results / Procedures / Treatments   Labs (all labs ordered are listed, but only abnormal results are displayed) Labs Reviewed  URINALYSIS, ROUTINE W REFLEX MICROSCOPIC - Abnormal; Notable for the following components:      Result Value   Leukocytes,Ua TRACE (*)    All other components within normal limits  BASIC METABOLIC PANEL - Abnormal; Notable for the following components:   Glucose, Bld 134 (*)    All other components within normal limits  URINE CULTURE  CBC    EKG None  Radiology No results found.  Procedures Procedures (including critical care time)  Medications Ordered in ED Medications - No data to display  ED Course  I have reviewed the triage vital signs and the nursing notes.  Pertinent labs & imaging results that were available during my care of the patient were reviewed by me and considered in my medical decision making (see chart for details).    MDM Rules/Calculators/A&P                      Patient presented with concerns of urinary tract infection.  Patient does have to self catheterize.  Laboratory tests are reassuring.  No signs of sepsis.  Patient does not have any abdominal pain.  Urinalysis however does show some leukocyte esterase.  UA is not definitive for UTI.  Will send off a urine culture.  However considering his symptoms I will start him on short course of antibiotics.  Outpatient follow-up with urology as planned. Final Clinical Impression(s) / ED Diagnoses Final diagnoses:  Dysuria    Rx / DC Orders ED Discharge Orders         Ordered    cephALEXin (KEFLEX) 500 MG capsule  3 times daily     01/29/20 1912           Dorie Rank, MD 01/29/20 [id number]

## 2020-01-30 LAB — URINE CULTURE

## 2020-03-01 NOTE — Progress Notes (Signed)
.D: 11/25/13 : 02:30pm  .T: Cardiology Consult     [facility]  [address], [address]  [place], [place] [address]  Phone: [phone] * Fax: [phone]  [email or web address]    Patient: Duane Thomas  Date of Birth: [birth year]  Gender:  male   Cardiologist: Adelene Idler  Referring Physician:  Marchelle Folks  (Fax:  [phone])  Primary Care Provider:  DAVID K. RANKIN    Chief Complaint/Reason for Consult:  F/U - Coronary Artery Disease    Medical History:     1.  CAD, with prior MI and CABG times 5, Aug 2012 in [place], [place].  2.  Hypertension.  3.  Diabetes Mellitus, type 2.  4.  Dyslipidemia.  5.  Left arm weakness, cervical disk disease.    .end  History of Present Illness:    Duane Thomas is referred by Dr. Luciana Axe for CAD to establish cardiologist for his coronary artery disease.    patient reports that he became suddenly diaphoretic and went to the emergency room in 2012 and was diagnosed to have a myocardial infarction. Subsequently he underwent cardiac catheterization and coronary artery bypass grafting surgery in [place], [place]. Currently he does not remember the name of the cardiologist or the hospital but he we'll forward that information to our office soon.    He denies any chest pain, shortness of breath, dizziness, palpitations, syncope, ankle swelling, orthopnea, paroxysmal nocturnal dyspnea. He reports that he was hospitalized at one of the regional hospital in [place] few months ago and workup was negative. He was having some left arm pain and weakness, subsequently he was diagnosed to have a neuropathy in that arm after having an MRI. Currently being worked up and treated for that.    Since his bypass surgery, he has not had any major cardiac issues so far.     Review of Systems:    As per HPI. 11-point Review of Systems, otherwise negative negative, except for :    cough,snoring, numbness and weakness of left arm.    Allergies:    Allergies:  NKDA  Allergy history last updated on 11/25/13    Current Medications:     Current Medications:  Rx: METOPROLOL TARTRATE 50MG  0.5 Tablet twice daily   Rx: ASPIRIN EC 81MG  1 Tablet DR daily   Rx: ATORVASTATIN CALCIUM 40MG  0.5 Tablet at bedtime   Rx: FINASTERIDE 5MG  1 Tablet at bedtime   Rx: LISINOPRIL 10MG  0.5 Tablet daily   Rx: METFORMIN HCL 500MG  1 Tablet twice daily   Rx: VITAMIN D3 2000 units 1 Tablet daily     Patient presented Rx by: List of Meds  nd    Family History (FHx)     Adopted, does not know FHx.     Marland Kitchenend  Social History (SHx)     Alcohol Consumption: Drinks socially   Caffeine Intake: YES  Exercise: YES  z.end  Tobacco Use:  Former smoker.    Physical Exam:  Vital Signs:  Bp: 120/80, Left Arm, Pulse: 102  Height: 5'8", Weight: 200 lbs  Other: 120/80 RIGHT ARM    BMI: 30.41 kg/m2  BMI: BMI of 30.41 kg/m2 qualifies as obese.      General:  Well appearing, well nourished in no distress.     Head:  normocephalic, atraumatic     Lungs:  CTA bilaterally, no wheezes, rhonchi, rales.  Breathing unlabored.    Neck: Supple, no  carotid bruit auscultated.   Endocrine:  .  Heart:  RRR, no murmurs, rubs or gallops   Chest wall:  Well healed midline incision.   Abdomen:  Soft, NT/ND, no HSM, no masses.   Extremities:  No deformities, clubbing, cyanosis, or edema.   Skin:  no rash or prominent lesions    Neurologic:  A/O x 3.     EKG:  Sinus rhythm, tall R waves in V1, V2.    Impressions/Recommendations:   1. Coronary artery disease, status post coronary artery bypass grafting surgery:  No recent angina, denies any shortness of breath or major symptoms. At this point I will get an echocardiogram to assess his left ventricular systolic function and get a baseline study.  Continue with current medications, no changes made today.  2. Hypertension: Blood pressure is well controlled.  3. Dyslipidemia: His LDL was in the 70s recently. He is on statin medication.  4. Diabetes mellitus: This is managed by his  primary care physician.  Follow-up in about 6 months, thank you for this referral.    .MP: Coronary atherosclerosis of native coronary artery : ICD9 = 414.01 / ICD10 = I25.10 / SNOMED = 5409811914782    .MP: Postsurgical aortocoronary bypass status : ICD9 = V45.81 / ICD10 = Z95.1 / SNOMED = 956213086  .CE: Heart CDX : Y    .MP: Unspecified essential hypertension : ICD9 = 401.9 / ICD10 = I10 / SNOMED = 57846962    .MP: DIABETES TYPE 2 CONTROLLED: ICD10 = E11.9 / ICD9 = 250.00 / SNOMED = 952841324    .MP: Other and unspecified hyperlipidemia : ICD9 = 272.4 / ICD10 = E78.5 / SNOMED = 40102725              #Orders: ECHOCARDIOGRAM [Do in Routine days]      #        SIGNED BY Adelene Idler, Dr. Augustina Mood)       11/25/2013 06:56P

## 2020-03-01 NOTE — Telephone Encounter (Signed)
No current PCP on file. Updates requested in Care Everywhere. Patient has upcoming appointment 03/20/2020.

## 2020-03-14 ENCOUNTER — Telehealth: Payer: Self-pay

## 2020-03-14 NOTE — Telephone Encounter (Signed)
Requested records from pcp. Requested updates in ce. Patient has an appt on 03/20/20

## 2020-03-20 ENCOUNTER — Ambulatory Visit: Payer: No Typology Code available for payment source | Admitting: Cardiovascular Disease

## 2020-03-20 ENCOUNTER — Encounter: Payer: Self-pay | Admitting: Cardiovascular Disease

## 2020-03-20 VITALS — BP 138/86 | HR 84 | Ht 68.0 in | Wt 196.9 lb

## 2020-03-20 DIAGNOSIS — I25119 Atherosclerotic heart disease of native coronary artery with unspecified angina pectoris: Secondary | ICD-10-CM

## 2020-03-20 DIAGNOSIS — I10 Essential (primary) hypertension: Secondary | ICD-10-CM

## 2020-03-20 DIAGNOSIS — E785 Hyperlipidemia, unspecified: Secondary | ICD-10-CM

## 2020-03-20 MED ORDER — METFORMIN HCL 500 MG PO TABS
500.00 mg | ORAL_TABLET | Freq: Two times a day (BID) | ORAL | 0 refills | Status: AC
Start: 2020-03-20 — End: ?

## 2020-03-20 NOTE — Progress Notes (Signed)
Cardiology Consult Note      Patient Name: Duane Thomas   Date of Birth: [birth year]    Provider: Rosalyn Gess, MD     Patient Care Team:  Hyacinth Meeker, MD as PCP - General (Family Medicine)  [name], Theodora Blow, MD as Consulting Physician (Cardiology)    Chief Complaint: Coronary Artery Disease (new patient)        History of Present Illness   Duane Thomas is a 74 y.o. male referred for evaluation of CAD.     He is status post CABG in 2011 in [place], reportedly in the setting of MI. He has not been following with a cardiologist, last seen 4 years ago.     He denies chest pain, shortness of breath, lightheadedness, dizziness, palpitations.     He checks his BP regularly at home. Usually 120/70s. He endorses occasional readings of HR as low as 50s. He is asymptomatic.     He does work around the house.     Review of Systems   Constitution: Negative for activity change, appetite change, fatigue, fever, and unexpected weight change  HENT: Negative for trouble swallowing, and voice change  Eyes: Negative for any visual disturbances  Respiratory: Negative for apnea and/or awakening with sob, chest tightness, and sob  Cardiovascular: Negative for chest pain/discomfort, leg or ankle swelling, difficulty breathing lying down, and palpitations  Gastrointestinal: Negative for abdominal pain, blood in stool, nausea, and vomiting  Endocrine: Negative for cold intolerance, heat intolerance, and polydipsia  GU: Negative for flank pain, frequent urination, and hematuria  Musculoskeletal: Negative for gait problems, joint swelling, neck pain, and pain or heaviness in legs  Skin: Negative for rash, wound, and ulcers on legs/feet  Neurological: Negative for dizziness, headaches, and syncope  Hematologic: Negative for bruises  Psychiatric: Negative for anxiety, and sleep disturbance    Comprehensive review of systems performed by me. Unless otherwise noted, all systems are negative.    Past Medical History     PMH 03/12/20 AB     Labs  01/24/20 : vit d-112, wbc-6.7, rbc-4.82, hgb-14.7, hct-42.5, plt-238, na-142, k-3.6, glucose-132, creat-1.2, bun-18, chol-121, trig-115, hdl-43, ldl-55, a1c-6.8, tsh-1.0     LOV   EKG     PCP 01/24/20 note scanned     1. CAD   A) CABG 2011     2. HTN   3. Hyperlipidemia   4. DM type II   5. Vit D deficiency    Past Surgical History     Past Surgical History:   Procedure Laterality Date    CARDIAC SURGERY      bypass x6    COLONOSCOPY N/A 01/09/2015    Procedure: COLONOSCOPY;  Surgeon: Chauncey Fischer, MD;  Location: [facility];  Service: Gastroenterology;  Laterality: N/A;    COLONOSCOPY N/A 08/09/2018    Procedure: COLONOSCOPY;  Surgeon: Chauncey Fischer, MD;  Location: [facility];  Service: Gastroenterology;  Laterality: N/A;    CORONARY ARTERY BYPASS GRAFT      left elbow      TONSILLECTOMY         Family History     Family History   Problem Relation Age of Onset    No known problems Mother     No known problems Father        Social History     Social History     Tobacco Use    Smoking status: Never Smoker    Smokeless tobacco: Never Used  Vaping Use    Vaping Use: Never used   Substance Use Topics    Alcohol use: No    Drug use: No       Allergies     has No Known Allergies.    Medications       Current Outpatient Medications:     aspirin 81 MG chewable tablet, Chew 81 mg by mouth daily., Disp: , Rfl:     atorvastatin (LIPITOR) 20 MG tablet, Take 20 mg by mouth daily, Disp: , Rfl:     Cholecalciferol (VITAMIN D) 1000 UNIT tablet, Take 1,000 Units by mouth daily., Disp: , Rfl:     finasteride (PROPECIA) 1 MG tablet, Take 5 mg by mouth daily  , Disp: , Rfl:     lisinopril (ZESTRIL) 5 MG tablet, Take 5 mg by mouth, Disp: , Rfl:     metFORMIN (GLUCOPHAGE) 500 MG tablet, Take 500 mg by mouth 2 (two) times daily with meals  , Disp: , Rfl:     metoprolol succinate XL (TOPROL-XL) 25 MG 24 hr tablet, Take 25 mg by mouth 2 (two) times daily  , Disp: , Rfl:     tamsulosin (FLOMAX) 0.4  MG Cap, Take 0.4 mg by mouth Daily after dinner, Disp: , Rfl:     UNKNOWN TO PATIENT, , Disp: , Rfl:       Physical Exam     Visit Vitals  BP 138/86 (BP Site: Right arm, Patient Position: Sitting)   Pulse 84   Ht 1.727 m (5\' 8" )   Wt 89.3 kg (196 lb 14.4 oz)   BMI 29.94 kg/m     Vitals:    03/20/20 1256   BP: 138/86   BP Site: Right arm   Patient Position: Sitting   Pulse: 84   Weight: 89.3 kg (196 lb 14.4 oz)   Height: 1.727 m (5\' 8" )     Wt Readings from Last 3 Encounters:   03/20/20 89.3 kg (196 lb 14.4 oz)   09/07/19 83.9 kg (185 lb)   08/26/18 95.2 kg (209 lb 14.1 oz)        Constitutional -  Well appearing, and in no distress  Eyes - Pupils equal and reactive, extraocular eye movements intact, sclera anicteric  Oropharynx - Moist mucous membranes  Respiratory - Clear to auscultation bilaterally, normal respiratory effort    Cardiovascular system -    Regular rate and rhythm    Normal S1, S2    No murmurs, rubs, gallops   Jugular venous pulse is normal        Carotid upstroke normal, no carotid bruits auscultated    Neurological - Alert, oriented, no focal neurological deficits  Extremities -  No clubbing or cyanosis. No peripheral edema  Skin - Warm and dry, no rashes  Psych- Appropriate affect    Labs     Lab Results   Component Value Date/Time    WBC 10.3 12/11/2016 11:18 AM    RBC 4.91 12/11/2016 11:18 AM    HGB 15.2 12/11/2016 11:18 AM    HCT 45.4 12/11/2016 11:18 AM    PLT 254 12/11/2016 11:18 AM        Lab Results   Component Value Date/Time    NA 140 12/11/2016 11:18 AM    K 4.4 12/11/2016 11:18 AM    CL 107 12/11/2016 11:18 AM    CO2 24.8 12/11/2016 11:18 AM    GLU 144 (H) 12/11/2016 11:18 AM    BUN 10  12/11/2016 11:18 AM    CREAT 0.94 12/11/2016 11:18 AM    PROT 6.6 12/11/2016 11:18 AM    ALKPHOS 67 12/11/2016 11:18 AM    AST 19 12/11/2016 11:18 AM    ALT 26 12/11/2016 11:18 AM       No results found for: CHOL, TRIG, HDL, LDL    Lab Results   Component Value Date/Time    HGBA1CPERCNT 6.3 12/11/2016  11:18 AM       Cardiogenics:   EKG: sinus rhythm, incomplete RBBB  Impression and Recommendations:     1. CAD s/p CABG in 2011 - he is asymptomatic  - I will obtain a TTE   - Continue ASA 81mg  and lipitor 20mg  daily     2. LUE pain 2/2 neuropathy     3. Neurogenic bladder     4. HLD - lipids at goal   - Continue current dose of statin     5. HTN - BP at goal   - Continue current regimen     6. Bradycardia - asymptomatic HR 50-60s.   - Continue current dose of BB     Follow up in 6 months         Thank you kindly for referring this patient.     Respectfully,   Rosalyn Gess, MD  03/20/2020  Electronically signed by:  Rosalyn Gess, MD

## 2020-05-29 NOTE — Progress Notes (Signed)
05/30/2020 2:18 PM   Kyle Richards July 12, 1946 [id number]  Referring provider: Clinic, [facility] [address] [facility] [place],  [place] [address] Chief Complaint  Patient presents with  . Hematuria    HPI: Kyle Richards is a 74 y.o. male who returns today for management of hematuria and a possible UTI.   He was previously seen in [facility] for kidney stones in 2013. He also had a prostate biopsy in the remote past (>10 years ago), which he recalled as returning negative.   He was initially seen and evaluated by Zara Council (06/14/2018) after a recently diagnosed UTI. This was a staph epidermal UTI with Ceftin. This was his second documented UTI in his history.  Urodynamics in 2019 showed impaired bladder sensation, for sensation occurring at 647 mL's.  Shortly thereafter at 15, he had a desire to void and a strong desire at 722 mL's.  He had a stable low pressure bladder contraction with a detrusor pressure at peak flow of 44 cm of water.  Max flow rate was 8 mL's per second.  The voiding pattern was obstructive in appearance.  He has significantly elevated PVR of 571, only voiding to 93 mL's.  He did have intermittent EMG activity during voiding.  On fluoroscopy, he was noted to have an elevation of the bladder base noted with a smooth regular bladder wall.  No reflux was appreciated.  He did have some intermittent increase in EMG activity with voiding.  BOOI equivocal falling in the 20-40 range consistently.  No instability appreciated.  Last cysto in April 2020 at the [facility]; patient reports findings of an enlarged, friable prostate with no signs of bladder cancer.  The patient was last seen in clinic by Debroah Loop, PA-C in 07/2019 for incomplete bladder emptying and acute cystitis with hematuria. (see note for details).   UA drawn at [facility] on 05/24/2020 noted 39 WBC, 21 RBC and nitrite positive. Patient was advised to see urologist.   He self cath 2  times a day. He is happy with his new cath given to him by the [place]. He denies hematuria, flank or abdominal pain.   He reports that he has not had any imaging done since 09/2019.   He is on finasteride and Flomax chronically.    PMH: Past Medical History:  Diagnosis Date  . Diabetes ([place])   . Heart attack ([place]) 2012  . High cholesterol   . Hypertension   . Neuropathy     Surgical History: Past Surgical History:  Procedure Laterality Date  . FINGER FRACTURE SURGERY Left    middle finger left hand  . TONSILLECTOMY AND ADENOIDECTOMY  1953    Home Medications:  Allergies as of 05/30/2020   No Known Allergies     Medication List       Accurate as of May 30, 2020 11:59 PM. If you have any questions, ask your nurse or doctor.        STOP taking these medications   finasteride 5 MG tablet Commonly known as: PROSCAR Stopped by: Hollice Espy, MD   Vitamin D-1000 Max St 25 MCG (1000 UT) tablet Generic drug: Cholecalciferol Stopped by: Hollice Espy, MD     TAKE these medications   aspirin 81 MG chewable tablet Chew 81 mg by mouth daily.   atorvastatin 20 MG tablet Commonly known as: LIPITOR Take 20 mg by mouth daily.   finasteride 1 MG tablet Commonly known as: PROPECIA Take by mouth.   lisinopril 5 MG  tablet Commonly known as: ZESTRIL Take 5 mg by mouth daily.   metFORMIN 500 MG tablet Commonly known as: GLUCOPHAGE Take 500 mg by mouth 2 (two) times daily. What changed: Another medication with the same name was removed. Continue taking this medication, and follow the directions you see here. Changed by: Hollice Espy, MD   metoprolol succinate 25 MG 24 hr tablet Commonly known as: TOPROL-XL Take by mouth.   metoprolol tartrate 25 MG tablet Commonly known as: LOPRESSOR Take 25 mg by mouth daily.   tamsulosin 0.4 MG Caps capsule Commonly known as: FLOMAX Take 1 capsule (0.4 mg total) by mouth daily.       Allergies: No Known  Allergies  Family History: No family history on file.  Social History:  reports that he has never smoked. He has never used smokeless tobacco. He reports current alcohol use. He reports that he does not use drugs.   Physical Exam: BP (!) 159/89   Pulse 62   Ht 5\' 8"  (1.727 m)   Wt 198 lb (89.8 kg)   BMI 30.11 kg/m   Constitutional:  Alert and oriented, No acute distress. HEENT: [place] AT, moist mucus membranes.  Trachea midline, no masses. Cardiovascular: No clubbing, cyanosis, or edema. Respiratory: Normal respiratory effort, no increased work of breathing. Skin: No rashes, bruises or suspicious lesions. Neurologic: Grossly intact, no focal deficits, moving all 4 extremities. Psychiatric: Normal mood and affect.  Laboratory Data:  Lab Results  Component Value Date   CREATININE 1.09 01/29/2020    Urinalysis 11-30 WBC, no RBC, nitrite positive and moderate bacteria   Assessment & Plan:    1. Incomplete bladder emptying Continue to self cath twice daily.  Suspect microscopic hematuria may be related to self catheterization /previously noted friable prostate/chronic bacterial colonization  2. Microscopic hematuria Patient is not concerned about microscopic hematuria today. Pt has not had a cystoscopy in over 1 year.  Schedule a cystoscopy and renal US (CT scan done at [facility] in recent past)  3. Chronic bacterial colonization UA shows 11-30 WBC, no RBC, nitrite positive and moderate bacteria.  He is otherwise asymptomatic today, will use this urine culture to give single dose antibiotic prophylaxis on the day of cystoscopy  Follow-up with renal ultrasound and cystoscopy  [facility] [address], [place], [place] [address] [phone]  I, Selena Batten, am acting as a scribe for Dr. Hollice Espy.  I have reviewed the above documentation for accuracy and completeness, and I agree with the above.   Hollice Espy, MD

## 2020-05-30 ENCOUNTER — Encounter: Payer: Self-pay | Admitting: Urology

## 2020-05-30 ENCOUNTER — Ambulatory Visit (INDEPENDENT_AMBULATORY_CARE_PROVIDER_SITE_OTHER): Payer: Medicare Other | Admitting: Urology

## 2020-05-30 ENCOUNTER — Other Ambulatory Visit: Payer: Self-pay

## 2020-05-30 VITALS — BP 159/89 | HR 62 | Ht 68.0 in | Wt 198.0 lb

## 2020-05-30 DIAGNOSIS — N401 Enlarged prostate with lower urinary tract symptoms: Secondary | ICD-10-CM | POA: Diagnosis not present

## 2020-05-30 DIAGNOSIS — N138 Other obstructive and reflux uropathy: Secondary | ICD-10-CM | POA: Diagnosis not present

## 2020-05-30 DIAGNOSIS — R319 Hematuria, unspecified: Secondary | ICD-10-CM | POA: Diagnosis not present

## 2020-05-30 DIAGNOSIS — R339 Retention of urine, unspecified: Secondary | ICD-10-CM | POA: Diagnosis not present

## 2020-05-30 NOTE — Patient Instructions (Signed)
Cystoscopy Cystoscopy is a procedure that is used to help diagnose and sometimes treat conditions that affect the lower urinary tract. The lower urinary tract includes the bladder and the urethra. The urethra is the tube that drains urine from the bladder. Cystoscopy is done using a thin, tube-shaped instrument with a light and camera at the end (cystoscope). The cystoscope may be hard or flexible, depending on the goal of the procedure. The cystoscope is inserted through the urethra, into the bladder. Cystoscopy may be recommended if you have:  Urinary tract infections that keep coming back.  Blood in the urine (hematuria).  An inability to control when you urinate (urinary incontinence) or an overactive bladder.  Unusual cells found in a urine sample.  A blockage in the urethra, such as a urinary stone.  Painful urination.  An abnormality in the bladder found during an intravenous pyelogram (IVP) or CT scan. Cystoscopy may also be done to remove a sample of tissue to be examined under a microscope (biopsy). What are the risks? Generally, this is a safe procedure. However, problems may occur, including:  Infection.  Bleeding.  What happens during the procedure?  1. You will be given one or more of the following: ? A medicine to numb the area (local anesthetic). 2. The area around the opening of your urethra will be cleaned. 3. The cystoscope will be passed through your urethra into your bladder. 4. Germ-free (sterile) fluid will flow through the cystoscope to fill your bladder. The fluid will stretch your bladder so that your health care provider can clearly examine your bladder walls. 5. Your doctor will look at the urethra and bladder. 6. The cystoscope will be removed The procedure may vary among health care providers  What can I expect after the procedure? After the procedure, it is common to have: 1. Some soreness or pain in your abdomen and urethra. 2. Urinary symptoms.  These include: ? Mild pain or burning when you urinate. Pain should stop within a few minutes after you urinate. This may last for up to 1 week. ? A small amount of blood in your urine for several days. ? Feeling like you need to urinate but producing only a small amount of urine. Follow these instructions at home: General instructions  Return to your normal activities as told by your health care provider.   Do not drive for 24 hours if you were given a sedative during your procedure.  Watch for any blood in your urine. If the amount of blood in your urine increases, call your health care provider.  If a tissue sample was removed for testing (biopsy) during your procedure, it is up to you to get your test results. Ask your health care provider, or the department that is doing the test, when your results will be ready.  Drink enough fluid to keep your urine pale yellow.  Keep all follow-up visits as told by your health care provider. This is important. Contact a health care provider if you:  Have pain that gets worse or does not get better with medicine, especially pain when you urinate.  Have trouble urinating.  Have more blood in your urine. Get help right away if you:  Have blood clots in your urine.  Have abdominal pain.  Have a fever or chills.  Are unable to urinate. Summary  Cystoscopy is a procedure that is used to help diagnose and sometimes treat conditions that affect the lower urinary tract.  Cystoscopy is done using   a thin, tube-shaped instrument with a light and camera at the end.  After the procedure, it is common to have some soreness or pain in your abdomen and urethra.  Watch for any blood in your urine. If the amount of blood in your urine increases, call your health care provider.  If you were prescribed an antibiotic medicine, take it as told by your health care provider. Do not stop taking the antibiotic even if you start to feel better. This  information is not intended to replace advice given to you by your health care provider. Make sure you discuss any questions you have with your health care provider. Document Revised: 08/10/2018 Document Reviewed: 08/10/2018 Elsevier Patient Education  2020 Elsevier Inc.   

## 2020-05-31 LAB — URINALYSIS, COMPLETE
Bilirubin, UA: NEGATIVE
Glucose, UA: NEGATIVE
Ketones, UA: NEGATIVE
Nitrite, UA: POSITIVE — AB
Protein,UA: NEGATIVE
RBC, UA: NEGATIVE
Specific Gravity, UA: >1.03 — ABNORMAL HIGH (ref 1.005–1.030)
Urobilinogen, Ur: 1 mg/dL (ref 0.2–1.0)
pH, UA: 5.5 (ref 5.0–7.5)

## 2020-05-31 LAB — MICROSCOPIC EXAMINATION

## 2020-06-02 ENCOUNTER — Encounter: Payer: Self-pay | Admitting: Urology

## 2020-06-02 LAB — CULTURE, URINE COMPREHENSIVE

## 2020-06-04 ENCOUNTER — Encounter: Payer: Self-pay | Admitting: Urology

## 2020-06-05 ENCOUNTER — Encounter: Payer: Self-pay | Admitting: Cardiovascular Disease

## 2020-06-06 ENCOUNTER — Other Ambulatory Visit: Payer: Self-pay

## 2020-06-06 ENCOUNTER — Ambulatory Visit
Admission: RE | Admit: 2020-06-06 | Discharge: 2020-06-06 | Disposition: A | Discharge status: Home / Self Care | Payer: Medicare Other | Source: Ambulatory Visit | Attending: Urology | Admitting: Urology

## 2020-06-06 DIAGNOSIS — R319 Hematuria, unspecified: Secondary | ICD-10-CM | POA: Diagnosis present

## 2020-06-25 NOTE — Progress Notes (Signed)
° °  06/26/2020  CC:  Chief Complaint  Patient presents with   Cysto    HPI: Kyle Richards is a 74 y.o. male who presents today for a cystoscopy.   He was previously seen in [facility] for kidney stones in 2013. He also had a prostate biopsy in the remote past (>10 years ago), which he recalled as returning negative.  He was initially seen and evaluated by Zara Council (06/14/2018) after a recently diagnosed UTI. This was a staph epidermal UTI with Ceftin. This was his second documented UTI in his history.  Urodynamics in 2019 showed impaired bladder sensation, for sensation occurring at 647 mL's. Shortly thereafter at 70, he had a desire to void and a strong desire at 722 mL's. He had a stable low pressure bladder contraction with a detrusor pressure at peak flow of 44 cm of water. Max flow rate was 8 mL's per second. The voiding pattern was obstructive in appearance. He has significantly elevated PVR of 571, only voiding to 93 mL's. He did have intermittent EMG activity during voiding. On fluoroscopy, he was noted to have an elevation of the bladder base noted with a smooth regular bladder wall. No reflux was appreciated. He did have some intermittent increase in EMG activity with voiding. BOOIequivocal falling in the 20-40 range consistently. No instability appreciated.  Last cysto in April 2020 at the [facility]; patient reports findings of an enlarged, friable prostate with no signs of bladder cancer.  The patient was last seen in clinic by Debroah Loop, PA-C in 07/2019 for incomplete bladder emptying and acute cystitis with hematuria. (see note for details).   UA drawn at [facility] on 05/24/2020 noted 39 WBC, 21 RBC and nitrite positive. Patient was advised to see urologist.   Renal ultrasound on 06/06/2020 revealed normal ultrasound appearance of the kidneys. Enlarged prostate  He is on finasteride and Flomax chronically.    Blood pressure 124/85, pulse 67,  height 5\' 8"  (1.727 m), weight 198 lb (89.8 kg). NED. A&Ox3.   No respiratory distress   Abd soft, NT, ND Normal phallus with bilateral descended testicles  Cystoscopy Procedure Note  Patient identification was confirmed, informed consent was obtained, and patient was prepped using Betadine solution.  Lidocaine jelly was administered per urethral meatus.     Pre-Procedure: - Inspection reveals a normal caliber ureteral meatus.  Procedure: The flexible cystoscope was introduced without difficulty - No urethral strictures/lesions are present. - Enlarged prostate with significant bilobar coaptation  - Mild bladder neck - Bilateral ureteral orifices identified - Bladder mucosa  reveals no ulcers, tumors, or lesions - No bladder stones - No trabeculation  Retroflexion shows no abnormalities.    Post-Procedure: - Patient tolerated the procedure well  Assessment/ Plan:  1. Incomplete bladder emptying Continue to self cath twice daily.  Follow up with [facility] in 1 year.   2. Microscopic hematuria NED  Cystoscopy unremarkable. Recent RUS is normal.  3. Chronic bacterial colonization  UA is negative.   Fransico Him, am acting as a scribe for Dr. Hollice Espy.  I have reviewed the above documentation for accuracy and completeness, and I agree with the above.   Hollice Espy, MD

## 2020-06-26 ENCOUNTER — Other Ambulatory Visit: Payer: Self-pay

## 2020-06-26 ENCOUNTER — Ambulatory Visit (INDEPENDENT_AMBULATORY_CARE_PROVIDER_SITE_OTHER): Payer: Medicare Other | Admitting: Urology

## 2020-06-26 ENCOUNTER — Encounter: Payer: Self-pay | Admitting: Urology

## 2020-06-26 VITALS — BP 124/85 | HR 67 | Ht 68.0 in | Wt 198.0 lb

## 2020-06-26 DIAGNOSIS — N138 Other obstructive and reflux uropathy: Secondary | ICD-10-CM | POA: Diagnosis not present

## 2020-06-26 DIAGNOSIS — N401 Enlarged prostate with lower urinary tract symptoms: Secondary | ICD-10-CM | POA: Diagnosis not present

## 2020-06-26 DIAGNOSIS — R339 Retention of urine, unspecified: Secondary | ICD-10-CM

## 2020-08-28 ENCOUNTER — Other Ambulatory Visit: Payer: Self-pay

## 2020-08-28 DIAGNOSIS — I25119 Atherosclerotic heart disease of native coronary artery with unspecified angina pectoris: Secondary | ICD-10-CM

## 2020-09-19 ENCOUNTER — Telehealth: Payer: Self-pay

## 2020-09-19 NOTE — Telephone Encounter (Signed)
Scanned Echo into chart. 09/19/2020 scan only encounter

## 2020-09-19 NOTE — Telephone Encounter (Signed)
Most recent office note, labs, etc.  Requested from [facility] office and updates were requested in care everywhere, upcoming appointment on 01.25.22

## 2020-09-19 NOTE — Progress Notes (Signed)
Echo 12/02/2013  [facility]M (Scan)        Interpretation Summary:  There is mild concentric LVH. Estimated EF=>70%. Grade I DD (impaired LV relaxation) is present. RV is normal in size and function. MV appears normal in structure and function. RV systolic pressure is estimated to be within normal limits. AV appears normal in structure and function. No hemodynamically significant valvular aortic stenosis. The aortic root is normal size. There is no pericardial effusion.

## 2020-09-25 ENCOUNTER — Other Ambulatory Visit: Payer: No Typology Code available for payment source

## 2020-09-25 ENCOUNTER — Encounter: Payer: No Typology Code available for payment source | Admitting: Cardiovascular Disease

## 2020-11-04 ENCOUNTER — Emergency Department (HOSPITAL_COMMUNITY)
Admission: EM | Admit: 2020-11-04 | Discharge: 2020-11-04 | Disposition: A | Discharge status: Home / Self Care | Payer: No Typology Code available for payment source | Attending: Emergency Medicine | Admitting: Emergency Medicine

## 2020-11-04 ENCOUNTER — Emergency Department (HOSPITAL_COMMUNITY): Payer: No Typology Code available for payment source

## 2020-11-04 ENCOUNTER — Encounter (HOSPITAL_COMMUNITY): Payer: Self-pay | Admitting: Emergency Medicine

## 2020-11-04 ENCOUNTER — Encounter: Payer: Self-pay | Admitting: Urology

## 2020-11-04 ENCOUNTER — Other Ambulatory Visit: Payer: Self-pay

## 2020-11-04 DIAGNOSIS — Z7984 Long term (current) use of oral hypoglycemic drugs: Secondary | ICD-10-CM | POA: Diagnosis not present

## 2020-11-04 DIAGNOSIS — Z7982 Long term (current) use of aspirin: Secondary | ICD-10-CM | POA: Insufficient documentation

## 2020-11-04 DIAGNOSIS — Z79899 Other long term (current) drug therapy: Secondary | ICD-10-CM | POA: Insufficient documentation

## 2020-11-04 DIAGNOSIS — E1140 Type 2 diabetes mellitus with diabetic neuropathy, unspecified: Secondary | ICD-10-CM | POA: Insufficient documentation

## 2020-11-04 DIAGNOSIS — N50812 Left testicular pain: Secondary | ICD-10-CM | POA: Diagnosis present

## 2020-11-04 DIAGNOSIS — I10 Essential (primary) hypertension: Secondary | ICD-10-CM | POA: Insufficient documentation

## 2020-11-04 DIAGNOSIS — N451 Epididymitis: Secondary | ICD-10-CM | POA: Diagnosis not present

## 2020-11-04 DIAGNOSIS — I252 Old myocardial infarction: Secondary | ICD-10-CM | POA: Diagnosis not present

## 2020-11-04 LAB — URINALYSIS, ROUTINE W REFLEX MICROSCOPIC
Bilirubin Urine: NEGATIVE
Glucose, UA: NEGATIVE mg/dL
Ketones, ur: NEGATIVE mg/dL
Nitrite: POSITIVE — AB
Protein, ur: NEGATIVE mg/dL
RBC / HPF: >50 RBC/hpf — ABNORMAL HIGH (ref 0–5)
Specific Gravity, Urine: 1.024 (ref 1.005–1.030)
WBC, UA: >50 WBC/hpf — ABNORMAL HIGH (ref 0–5)
pH: 5 (ref 5.0–8.0)

## 2020-11-04 MED ORDER — LEVOFLOXACIN 500 MG PO TABS
500.0000 mg | ORAL_TABLET | Freq: Once | ORAL | Status: AC
  Administered 2020-11-04: 500 mg via ORAL
  Filled 2020-11-04 (×2): qty 1

## 2020-11-04 MED ORDER — LEVOFLOXACIN 500 MG PO TABS: 500.0000 mg | ORAL_TABLET | Freq: Every day | ORAL | 0 refills | Status: AC

## 2020-11-04 NOTE — ED Provider Notes (Signed)
[facility]ERG[facility] DEPARTMENT Provider Note   CSN: [id number] Arrival date & time: 11/04/20  1008     History Chief Complaint  Patient presents with  . Testicle Pain    Kyle Richards is a 75 y.o. male with history of neurogenic bladder, self cathing, presented to emergency department with left testicle pain.  He reports gradual onset of symptoms about 4 days ago.  He became more intense yesterday with pain and swelling in his left testicle.  He denies any trauma preceding this.  He denies any fevers or chills.  He does self cath up to 3 times a day for neurogenic bladder.  He uses a coud cath.  He notices occasional small amounts of blood but no persistent bleeding.  He is not on anticoagulation.  His urologist is at the [facility] hospital.  He has no prior history of orchitis, epididymitis, testicular infection he is aware of.  He has not had any recent prostate procedures or urological procedures.  HPI     Past Medical History:  Diagnosis Date  . Diabetes ([place])   . Heart attack ([place]) 2012  . High cholesterol   . Hypertension   . Neuropathy     There are no problems to display for this patient.   Past Surgical History:  Procedure Laterality Date  . FINGER FRACTURE SURGERY Left    middle finger left hand  . TONSILLECTOMY AND ADENOIDECTOMY  1953       No family history on file.  Social History   Tobacco Use  . Smoking status: Never Smoker  . Smokeless tobacco: Never Used  Vaping Use  . Vaping Use: Never used  Substance Use Topics  . Alcohol use: Yes    Comment: 1 beer monthly  . Drug use: Never    Home Medications Prior to Admission medications   Medication Sig Start Date End Date Taking? Authorizing Provider  levofloxacin (LEVAQUIN) 500 MG tablet Take 1 tablet (500 mg total) by mouth daily for 9 days. 11/05/20 11/14/20 Yes Demetreus Lothamer, Carola Rhine, MD  aspirin 81 MG chewable tablet Chew 81 mg by mouth daily.    [provider]  atorvastatin  (LIPITOR) 20 MG tablet Take 20 mg by mouth daily.    [provider]  finasteride (PROPECIA) 1 MG tablet Take by mouth.    [provider]  lisinopril (PRINIVIL,ZESTRIL) 5 MG tablet Take 5 mg by mouth daily.    [provider]  metFORMIN (GLUCOPHAGE) 500 MG tablet Take 500 mg by mouth 2 (two) times daily. 03/20/20   [provider]  metoprolol tartrate (LOPRESSOR) 25 MG tablet Take 25 mg by mouth 2 (two) times daily.     [provider]  tamsulosin (FLOMAX) 0.4 MG CAPS capsule Take 1 capsule (0.4 mg total) by mouth daily. 06/14/18   Zara Council A, PA-C    Allergies    Patient has no known allergies.  Review of Systems   Review of Systems  Constitutional: Negative for chills and fever.  Respiratory: Negative for cough and shortness of breath.   Cardiovascular: Negative for chest pain and palpitations.  Gastrointestinal: Negative for abdominal pain, diarrhea, nausea and vomiting.  Genitourinary: Positive for testicular pain. Negative for hematuria, penile swelling and scrotal swelling.  Musculoskeletal: Negative for arthralgias and back pain.  Skin: Negative for color change and rash.  Neurological: Negative for syncope and headaches.  All other systems reviewed and are negative.   Physical Exam Updated Vital Signs BP 133/86 (  BP Location: Right Arm)   Pulse 62   Temp 98.5 F (36.9 C) (Oral)   Resp 18   SpO2 98%   Physical Exam Constitutional:      General: He is not in acute distress. HENT:     Head: Normocephalic and atraumatic.  Eyes:     Conjunctiva/sclera: Conjunctivae normal.     Pupils: Pupils are equal, round, and reactive to light.  Cardiovascular:     Rate and Rhythm: Normal rate and regular rhythm.  Pulmonary:     Effort: Pulmonary effort is normal. No respiratory distress.  Abdominal:     General: There is no distension.     Tenderness: There is no abdominal tenderness.  Genitourinary:    Penis: Normal.       Comments: Mild diffuse swelling and tenderness of left testicle No epididymal tenderness Normal cremasteric reflex Skin:    General: Skin is warm and dry.  Neurological:     General: No focal deficit present.     Mental Status: He is alert. Mental status is at baseline.  Psychiatric:        Mood and Affect: Mood normal.        Behavior: Behavior normal.     ED Results / Procedures / Treatments   Labs (all labs ordered are listed, but only abnormal results are displayed) Labs Reviewed  URINALYSIS, ROUTINE W REFLEX MICROSCOPIC - Abnormal; Notable for the following components:      Result Value   APPearance HAZY (*)    Hgb urine dipstick LARGE (*)    Nitrite POSITIVE (*)    Leukocytes,Ua MODERATE (*)    RBC / HPF >50 (*)    WBC, UA >50 (*)    Bacteria, UA RARE (*)    All other components within normal limits  URINE CULTURE    EKG None  Radiology US SCROTUM W/DOPPLER  Result Date: 11/04/2020 CLINICAL DATA:  Left testicle swelling and pain EXAM: SCROTAL ULTRASOUND DOPPLER ULTRASOUND OF THE TESTICLES TECHNIQUE: Complete ultrasound examination of the testicles, epididymis, and other scrotal structures was performed. Color and spectral Doppler ultrasound were also utilized to evaluate blood flow to the testicles. COMPARISON:  None. FINDINGS: Right testicle Measurements: 4.4 x 2.1 x 3.3 cm. No mass or microlithiasis visualized. Left testicle Measurements: 4.2 x 2.3 x 3.2 Cm. No mass or microlithiasis visualized. Right epididymis:  8 mm epididymal cyst versus spermatocele. Left epididymis: Enlarged epididymal tail with hypervascularity, including on image 89. Hydrocele:  Small left-sided hydrocele. Varicocele:  Small left varicocele. Pulsed Doppler interrogation of both testes demonstrates normal low resistance arterial and venous waveforms bilaterally. Suggestion of left-sided scrotal skin thickening. IMPRESSION: 1. Left-sided epididymitis. 2. Small left hydrocele. 3. Left-sided  varicocele. Electronically Signed   By: Abigail Miyamoto M.D.   On: 11/04/2020 12:20    Procedures Procedures   Medications Ordered in ED Medications  levofloxacin (LEVAQUIN) tablet 500 mg (500 mg Oral Given 11/04/20 1300)    ED Course  I have reviewed the triage vital signs and the nursing notes.  Pertinent labs & imaging results that were available during my care of the patient were reviewed by me and considered in my medical decision making (see chart for details).  DDx includes orchitis vs epididymitis vs abscess vs other Scrotal u/s ordered UA and urine cx ordered  Doubt sepsis Pain is minimal  Clinical Course as of 11/04/20 1735  Sun Nov 04, 2020  1229 IMPRESSION: 1. Left-sided epididymitis. 2. Small left hydrocele. 3. Left-sided  varicocele. [MT]  1229 Epididymitis of left testicle.  Unlikely STI - more likely enteric or E coli.  With higher rates of bactrim resistance in our community we'll treat with levaquin 500 mg daily x  10 days. [MT]    Clinical Course User Index [MT] Lilliah Priego, Carola Rhine, MD    Final Clinical Impression(s) / ED Diagnoses Final diagnoses:  Epididymitis    Rx / DC Orders ED Discharge Orders         Ordered    levofloxacin (LEVAQUIN) 500 MG tablet  Daily        11/04/20 1231           Wyvonnia Dusky, MD 11/04/20 1735

## 2020-11-04 NOTE — ED Triage Notes (Signed)
C/o L testicle being sore since yesterday and swelling today.  Denies pain- states "it is just sore if you touch it."

## 2020-11-04 NOTE — Discharge Instructions (Signed)
Please call to follow up with your urologist.  Your pain and symptoms should improve over the next 2 days.

## 2020-11-04 NOTE — ED Notes (Signed)
States he has to self cath everyday has a neurogenic bladder.

## 2020-11-05 NOTE — Telephone Encounter (Signed)
Spoke with patient regarding recent ER visit-patient started antibiotics yesterday-testicle swelling has not improved. No other symptoms. Advised to complete course of antibiotics as prescribed-if symptoms worsen to notify the office or seek medical attention.

## 2020-11-06 ENCOUNTER — Encounter: Payer: Self-pay | Admitting: Urology

## 2020-11-06 LAB — URINE CULTURE: Culture: >=100000 — AB

## 2020-11-15 ENCOUNTER — Encounter: Payer: Self-pay | Admitting: Urology

## 2020-11-21 ENCOUNTER — Ambulatory Visit: Payer: Self-pay | Admitting: Urology

## 2020-11-23 ENCOUNTER — Encounter: Payer: Self-pay | Admitting: Emergency Medicine

## 2020-11-23 ENCOUNTER — Emergency Department
Admission: EM | Admit: 2020-11-23 | Discharge: 2020-11-23 | Disposition: A | Discharge status: Home / Self Care | Payer: No Typology Code available for payment source | Attending: Emergency Medicine | Admitting: Emergency Medicine

## 2020-11-23 ENCOUNTER — Other Ambulatory Visit: Payer: Self-pay

## 2020-11-23 DIAGNOSIS — S70362A Insect bite (nonvenomous), left thigh, initial encounter: Secondary | ICD-10-CM | POA: Insufficient documentation

## 2020-11-23 DIAGNOSIS — Z7984 Long term (current) use of oral hypoglycemic drugs: Secondary | ICD-10-CM | POA: Insufficient documentation

## 2020-11-23 DIAGNOSIS — Z79899 Other long term (current) drug therapy: Secondary | ICD-10-CM | POA: Diagnosis not present

## 2020-11-23 DIAGNOSIS — I10 Essential (primary) hypertension: Secondary | ICD-10-CM | POA: Insufficient documentation

## 2020-11-23 DIAGNOSIS — S79922A Unspecified injury of left thigh, initial encounter: Secondary | ICD-10-CM | POA: Diagnosis present

## 2020-11-23 DIAGNOSIS — E1140 Type 2 diabetes mellitus with diabetic neuropathy, unspecified: Secondary | ICD-10-CM | POA: Insufficient documentation

## 2020-11-23 DIAGNOSIS — Z7982 Long term (current) use of aspirin: Secondary | ICD-10-CM | POA: Diagnosis not present

## 2020-11-23 DIAGNOSIS — W57XXXA Bitten or stung by nonvenomous insect and other nonvenomous arthropods, initial encounter: Secondary | ICD-10-CM | POA: Diagnosis not present

## 2020-11-23 MED ORDER — DOXYCYCLINE HYCLATE 100 MG PO TABS: 100.0000 mg | ORAL_TABLET | Freq: Two times a day (BID) | ORAL | 0 refills | Status: AC

## 2020-11-23 MED ORDER — DOXYCYCLINE HYCLATE 100 MG PO TABS
100.0000 mg | ORAL_TABLET | Freq: Once | ORAL | Status: AC
  Administered 2020-11-23: 100 mg via ORAL
  Filled 2020-11-23: qty 1

## 2020-11-23 NOTE — ED Notes (Signed)
First nurse note: Pt saw tick on him tonight and pulled it off immediately.  Came in to be checked, denies symptoms.

## 2020-11-23 NOTE — ED Provider Notes (Signed)
[facility] Emergency Department Provider Note  ____________________________________________  Time seen: Approximately 8:48 PM  I have reviewed the triage vital signs and the nursing notes.   HISTORY  Chief Complaint Insect Bite    HPI Kyle Richards is a 75 y.o. male who presents the emergency department for evaluation of a tick bite to the left thigh.  Patient states that he found a tick embedded on his left thigh.  He believes that the tick was likely in place for at least 24 hours.  Patient developed erythematous rash around this bite and he presents for evaluation for Lyme versus [facility] spotted fever.  Patient states that roughly a decade ago he was bit by a tick that developed a targetoid lesion but was treated with an appropriate regimen of doxycycline.  Given the rash, following a tick bite patient presented for evaluation tonight.  No complaints of fevers, chills, body aches, malaise.  He noticed the tick 2 days ago and removed same 2 days ago.         Past Medical History:  Diagnosis Date  . Diabetes ([place])   . Heart attack ([place]) 2012  . High cholesterol   . Hypertension   . Neuropathy     There are no problems to display for this patient.   Past Surgical History:  Procedure Laterality Date  . FINGER FRACTURE SURGERY Left    middle finger left hand  . TONSILLECTOMY AND ADENOIDECTOMY  1953    Prior to Admission medications   Medication Sig Start Date End Date Taking? Authorizing Provider  doxycycline (VIBRA-TABS) 100 MG tablet Take 1 tablet (100 mg total) by mouth 2 (two) times daily for 10 days. 11/23/20 12/03/20 Yes Cuthriell, Charline Bills, PA-C  aspirin 81 MG chewable tablet Chew 81 mg by mouth daily.    [provider]  atorvastatin (LIPITOR) 20 MG tablet Take 20 mg by mouth daily.    [provider]  finasteride (PROPECIA) 1 MG tablet Take by mouth.    [provider]  lisinopril (PRINIVIL,ZESTRIL) 5 MG tablet  Take 5 mg by mouth daily.    [provider]  metFORMIN (GLUCOPHAGE) 500 MG tablet Take 500 mg by mouth 2 (two) times daily. 03/20/20   [provider]  metoprolol tartrate (LOPRESSOR) 25 MG tablet Take 25 mg by mouth 2 (two) times daily.     [provider]  tamsulosin (FLOMAX) 0.4 MG CAPS capsule Take 1 capsule (0.4 mg total) by mouth daily. 06/14/18   Zara Council A, PA-C    Allergies Patient has no known allergies.  History reviewed. No pertinent family history.  Social History Social History   Tobacco Use  . Smoking status: Never Smoker  . Smokeless tobacco: Never Used  Vaping Use  . Vaping Use: Never used  Substance Use Topics  . Alcohol use: Yes    Comment: 1 beer monthly  . Drug use: Never     Review of Systems  Constitutional: No fever/chills Eyes: No visual changes. No discharge ENT: No upper respiratory complaints. Cardiovascular: no chest pain. Respiratory: no cough. No SOB. Gastrointestinal: No abdominal pain.  No nausea, no vomiting.  No diarrhea.  No constipation. Musculoskeletal: Negative for musculoskeletal pain. Skin: Tick bite to left thigh Neurological: Negative for headaches, focal weakness or numbness.  10 System ROS otherwise negative.  ____________________________________________   PHYSICAL EXAM:  VITAL SIGNS: ED Triage Vitals  Enc Vitals Group     BP 11/23/20 2006 (!) 152/94  Pulse Rate 11/23/20 2006 84     Resp 11/23/20 2006 16     Temp 11/23/20 2006 98 F (36.7 C)     Temp Source 11/23/20 2006 Oral     SpO2 11/23/20 2006 94 %     Weight 11/23/20 2007 195 lb (88.5 kg)     Height 11/23/20 2007 5\' 9"  (1.753 m)     Head Circumference --      Peak Flow --      Pain Score 11/23/20 2007 0     Pain Loc --      Pain Edu? --      Excl. in [place]? --      Constitutional: Alert and oriented. Well appearing and in no acute distress. Eyes: Conjunctivae are normal. PERRL. EOMI. Head: Atraumatic. ENT:       Ears:       Nose: No congestion/rhinnorhea.      Mouth/Throat: Mucous membranes are moist.  Neck: No stridor.    Cardiovascular: Normal rate, regular rhythm. Normal S1 and S2.  Good peripheral circulation. Respiratory: Normal respiratory effort without tachypnea or retractions. Lungs CTAB. Good air entry to the bases with no decreased or absent breath sounds. Musculoskeletal: Full range of motion to all extremities. No gross deformities appreciated. Neurologic:  Normal speech and language. No gross focal neurologic deficits are appreciated.  Skin:  Skin is warm, dry and intact. No rash noted. Psychiatric: Mood and affect are normal. Speech and behavior are normal. Patient exhibits appropriate insight and judgement.   ____________________________________________   LABS (all labs ordered are listed, but only abnormal results are displayed)  Labs Reviewed - No data to display ____________________________________________  EKG   ____________________________________________  RADIOLOGY   No results found.  ____________________________________________    PROCEDURES  Procedure(s) performed:    Procedures    Medications  doxycycline (VIBRA-TABS) tablet 100 mg (100 mg Oral Given 11/23/20 2050)     ____________________________________________   INITIAL IMPRESSION / ASSESSMENT AND PLAN / ED COURSE  Pertinent labs & imaging results that were available during my care of the patient were reviewed by me and considered in my medical decision making (see chart for details).  Review of the [place] CSRS was performed in accordance of the [place] prior to dispensing any controlled drugs.           Patient's diagnosis is consistent with tick bite of the left thigh.  Patient presented to the emergency department with an erythematous lesion surrounding a left thigh tick bite.  This is not targetoid.  Patient does not have the appearance of [facility] spotted fever.  No other systemic  complaints.  However given the onset of rash surrounding this bite, I will treat with doxycycline..  I will treat with a 10-day course of doxycycline instead of the solitary doxycycline prophylaxis.  Discussed my recommendations with the patient who is agreeable with this course.  First dose of doxy provided here in the emergency department.  Remainder will be prescribed.  Follow-up primary care as needed.  Patient is given ED precautions to return to the ED for any worsening or new symptoms.     ____________________________________________  FINAL CLINICAL IMPRESSION(S) / ED DIAGNOSES  Final diagnoses:  Tick bite of left thigh, initial encounter      NEW MEDICATIONS STARTED DURING THIS VISIT:  ED Discharge Orders         Ordered    doxycycline (VIBRA-TABS) 100 MG tablet  2 times daily  11/23/20 2047              This chart was dictated using voice recognition software/Dragon. Despite best efforts to proofread, errors can occur which can change the meaning. Any change was purely unintentional.    Brynda Peon 11/23/20 2055    Harvest Dark, MD 11/23/20 2159

## 2020-11-23 NOTE — ED Triage Notes (Signed)
Patient ambulatory to triage with complaints of bruising at tick bite site since Wednesday.  Pt has relative that passed concern for Lyme disease.  Pt reported some itching at site after bite.    Site is pain free, Pt denies N/V/D/HA/chills/sweating/fever. Pt reports taking prescribed medications today.  Speaking in complete coherent sentences. No acute breathing distress noted.

## 2020-11-24 ENCOUNTER — Encounter: Payer: Self-pay | Admitting: Urology

## 2020-11-26 NOTE — Telephone Encounter (Signed)
Called patient to inform-Ok to take Doxycycline per Sam Valliancourt,PA-voiced understanding

## 2020-11-26 NOTE — Telephone Encounter (Signed)
Called patient to discuss further-patient wanted to know if he should continue taking Probiotic while on Doxycylcine or finish whole bottle after completing doxycycline.

## 2020-12-06 NOTE — Progress Notes (Signed)
Echo 12/02/2013  [facility] (Scan)      Interpretation Summary:  There is mild concentric LVH. Estimated EF=>70%. Grade I DD (impaired LV relaxation) is present. RV is normal in size and function. MV appears normal in structure and function. RV systolic pressure is estimated to be within normal limits. AV appears normal in structure and function. No hemodynamically significant valvular aortic stenosis. The aortic root is normal size. There is no pericardial effusion.

## 2020-12-12 ENCOUNTER — Encounter: Payer: Self-pay | Admitting: Cardiovascular Disease

## 2020-12-12 ENCOUNTER — Ambulatory Visit: Payer: No Typology Code available for payment source | Admitting: Cardiovascular Disease

## 2020-12-12 ENCOUNTER — Ambulatory Visit: Payer: No Typology Code available for payment source

## 2020-12-12 ENCOUNTER — Other Ambulatory Visit: Payer: Self-pay | Admitting: Cardiovascular Disease

## 2020-12-12 VITALS — BP 142/88 | HR 64 | Ht 68.0 in | Wt 205.0 lb

## 2020-12-12 DIAGNOSIS — I25119 Atherosclerotic heart disease of native coronary artery with unspecified angina pectoris: Secondary | ICD-10-CM

## 2020-12-12 DIAGNOSIS — E785 Hyperlipidemia, unspecified: Secondary | ICD-10-CM

## 2020-12-12 DIAGNOSIS — Z951 Presence of aortocoronary bypass graft: Secondary | ICD-10-CM

## 2020-12-12 DIAGNOSIS — I10 Essential (primary) hypertension: Secondary | ICD-10-CM

## 2020-12-12 NOTE — Progress Notes (Signed)
Cardiology Follow-Up      Patient Name: Duane Thomas   Date of Birth: 24-Apr-1946    Provider: Rosalyn Gess, MD     Patient Care Team:  Hyacinth Meeker, MD as PCP - General (Family Medicine)  [name] Theodora Blow, MD as Consulting Physician (Cardiology)    Chief Complaint: Coronary Artery Disease and Hypertension      History of Present Illness   Duane Thomas is a 75 y.o. 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 He has history of neurogenic bladder, treats with intermittent self cath.     He was last seen in 03/2020. He denies chest pain, shortness of breath, lightheadedness, dizziness, palpitations, LE edema.     He was admitted for a testicular infection recently, treated with antibiotics. He also had a tick bite in 10/2020 and completed course of antibiotics.     Review of Systems   Constitution: Negative for activity change, appetite change, fatigue, fever, and unexpected weight change  HENT: Negative for trouble swallowing, and voice change  Eyes: Negative for any visual disturbances  Respiratory: Negative for apnea and/or awakening with sob, chest tightness, and sob  Cardiovascular: Negative for chest pain/discomfort, leg or ankle swelling, difficulty breathing lying down, and palpitations  Gastrointestinal: Negative for abdominal pain, blood in stool, nausea, and vomiting  Endocrine: Negative for cold intolerance, heat intolerance, and polydipsia  GU: Negative for flank pain, frequent urination, and hematuria  Musculoskeletal: Negative for gait problems, joint swelling, neck pain, and pain or heaviness in legs  Skin: Negative for rash, wound, and ulcers on legs/feet  Neurological: Negative for dizziness, headaches, and syncope  Hematologic: Negative for bruises  Psychiatric: Negative for anxiety, and sleep disturbance    Comprehensive review of systems performed by me. Unless otherwise noted, all systems are negative.    Past Medical History     PMH 12/04/20  AB     Labs 01/24/20 : vit d-112, wbc-6.7, rbc-4.82, hgb-14.7, hct-42.5, plt-238, na-142, k-3.6, glucose-132, creat-1.2, bun-18, chol-121, trig-115, hdl-43, ldl-55, a1c-6.8, tsh-1.0     LOV 03/20/20 SPA  EKG 03/20/20    PCP 01/24/20 note scanned     Echo scheduled 12/12/20    1. CAD   A) CABG 2011   B) Echo 12/02/13 : Mild concentric LVH. EF >70%. Grade I DD. RV is normal in size and function. MV appears normal in structure and function. RV systolic pressure is estimated to be ENL. AV appears normal in structure and function. No hemodynamically significant valvular aortic stenosis. Aortic root is normal size.    2. HTN   3. Hyperlipidemia   4. DM type II   5. Vit D deficiency    Past Surgical History     Past Surgical History:   Procedure Laterality Date    CARDIAC SURGERY      bypass x6    COLONOSCOPY N/A 01/09/2015    Procedure: COLONOSCOPY;  Surgeon: Chauncey Fischer, MD;  Location: [facility];  Service: Gastroenterology;  Laterality: N/A;    COLONOSCOPY N/A 08/09/2018    Procedure: COLONOSCOPY;  Surgeon: Chauncey Fischer, MD;  Location: [facility];  Service: Gastroenterology;  Laterality: N/A;    CORONARY ARTERY BYPASS GRAFT      left elbow      TONSILLECTOMY         Family History     Family History   Problem Relation Age of Onset  No known problems Mother     No known problems Father        Social History     Social History     Tobacco Use    Smoking status: Never Smoker    Smokeless tobacco: Never Used   Haematologist Use: Never used   Substance Use Topics    Alcohol use: No    Drug use: No       Allergies     has No Known Allergies.    Medications       Current Outpatient Medications:     aspirin 81 MG chewable tablet, Chew 81 mg by mouth daily., Disp: , Rfl:     atorvastatin (LIPITOR) 20 MG tablet, Take 20 mg by mouth daily, Disp: , Rfl:     Cholecalciferol (VITAMIN D) 1000 UNIT tablet, Take 1,000 Units by mouth daily., Disp: , Rfl:     finasteride (PROPECIA) 1 MG tablet,  Take 5 mg by mouth daily  , Disp: , Rfl:     lisinopril (ZESTRIL) 5 MG tablet, Take 5 mg by mouth, Disp: , Rfl:     metFORMIN (GLUCOPHAGE) 500 MG tablet, Take 1 tablet (500 mg total) by mouth 2 (two) times daily with meals, Disp: 60 tablet, Rfl: 0    metoprolol succinate XL (TOPROL-XL) 25 MG 24 hr tablet, Take 25 mg by mouth 2 (two) times daily  , Disp: , Rfl:     tamsulosin (FLOMAX) 0.4 MG Cap, Take 0.4 mg by mouth Daily after dinner, Disp: , Rfl:     UNKNOWN TO PATIENT, , Disp: , Rfl:     Physical Exam     Visit Vitals  BP 142/88 (BP Site: Left arm, Patient Position: Sitting)   Pulse 64   Ht 1.727 m (5\' 8" )   Wt 93 kg (205 lb)   BMI 31.17 kg/m     Vitals:    12/12/20 0907   BP: 142/88   BP Site: Left arm   Patient Position: Sitting   Pulse: 64   Weight: 93 kg (205 lb)   Height: 1.727 m (5\' 8" )     Wt Readings from Last 3 Encounters:   12/12/20 93 kg (205 lb)   03/20/20 89.3 kg (196 lb 14.4 oz)   09/07/19 83.9 kg (185 lb)        Constitutional -  Well appearing, and in no distress  Eyes - Extraocular eye movements intact, sclera anicteric  Oropharynx - Moist mucous membranes  Respiratory - Clear to auscultation bilaterally, normal respiratory effort    Cardiovascular system -    Regular rate and rhythm    Normal S1, S2    No murmurs, rubs, gallops   Jugular venous pulse is normal        Carotid upstroke normal, no carotid bruits auscultated    Neurological - Alert, oriented, no focal neurological deficits  Extremities -  No clubbing or cyanosis. No peripheral edema  Skin - Warm and dry, no rashes  Psych- Appropriate affect    Labs     Lab Results   Component Value Date/Time    WBC 10.3 12/11/2016 11:18 AM    RBC 4.91 12/11/2016 11:18 AM    HGB 15.2 12/11/2016 11:18 AM    HCT 45.4 12/11/2016 11:18 AM    PLT 254 12/11/2016 11:18 AM       Lab Results   Component Value Date/Time    NA 140 12/11/2016 11:18 AM  K 4.4 12/11/2016 11:18 AM    CL 107 12/11/2016 11:18 AM    CO2 24.8 12/11/2016 11:18 AM    GLU 144 (H)  12/11/2016 11:18 AM    BUN 10 12/11/2016 11:18 AM    CREAT 0.94 12/11/2016 11:18 AM    PROT 6.6 12/11/2016 11:18 AM    ALKPHOS 67 12/11/2016 11:18 AM    AST 19 12/11/2016 11:18 AM    ALT 26 12/11/2016 11:18 AM       No results found for: CHOL, TRIG, HDL, LDL    Lab Results   Component Value Date/Time    HGBA1CPERCNT 6.3 12/11/2016 11:18 AM       Cardiogenics:     EKG: N/A    Impression and Recommendations:   1. CAD s/p CABG in 2011 - he is asymptomatic   - Continue ASA 81mg  and lipitor 20mg  daily   - Had lipid panel done at the [place], will send records     2. LUE pain 2/2 neuropathy     3. Neurogenic bladder     4. HLD   - Continue current dose of statin     5. HTN - BP slightly above goal here, but reportedly 120/80s   - Continue current regimen     6. Bradycardia - asymptomatic HR 50-60s.   - Continue current dose of BB     Electronically signed by:   Rosalyn Gess, MD  12/12/2020

## 2020-12-13 NOTE — Telephone Encounter (Signed)
Please see MyChart message with lab results - I have printed them and placed on your desk as well.    A Naoma Diener LPN

## 2020-12-13 NOTE — Telephone Encounter (Signed)
Mychart message.    Thanks,  Woodroe Vogan R Treva Huyett, CNA

## 2020-12-14 ENCOUNTER — Encounter: Payer: Self-pay | Admitting: Cardiovascular Disease

## 2020-12-14 NOTE — Telephone Encounter (Signed)
Updated patient lab work - lipid panel.    A Naoma Diener LPN

## 2020-12-14 NOTE — Telephone Encounter (Signed)
Please see mychart message.    Thanks,  Katherine Tout R Christain Mcraney, CNA

## 2020-12-14 NOTE — Telephone Encounter (Signed)
Spoke with patient he believes he had his cholesterol checked.  We see if he can get results for [place].  If it was not done he will call back so we can order.    A Naoma Diener LPN

## 2021-01-15 ENCOUNTER — Emergency Department
Admission: EM | Admit: 2021-01-15 | Discharge: 2021-01-15 | Disposition: A | Discharge status: Home / Self Care | Payer: No Typology Code available for payment source | Attending: Emergency Medicine | Admitting: Emergency Medicine

## 2021-01-15 ENCOUNTER — Other Ambulatory Visit: Payer: Self-pay

## 2021-01-15 DIAGNOSIS — W57XXXA Bitten or stung by nonvenomous insect and other nonvenomous arthropods, initial encounter: Secondary | ICD-10-CM | POA: Diagnosis not present

## 2021-01-15 DIAGNOSIS — E1140 Type 2 diabetes mellitus with diabetic neuropathy, unspecified: Secondary | ICD-10-CM | POA: Diagnosis not present

## 2021-01-15 DIAGNOSIS — Z79899 Other long term (current) drug therapy: Secondary | ICD-10-CM | POA: Insufficient documentation

## 2021-01-15 DIAGNOSIS — Z7982 Long term (current) use of aspirin: Secondary | ICD-10-CM | POA: Insufficient documentation

## 2021-01-15 DIAGNOSIS — S50812A Abrasion of left forearm, initial encounter: Secondary | ICD-10-CM | POA: Diagnosis not present

## 2021-01-15 DIAGNOSIS — S59912A Unspecified injury of left forearm, initial encounter: Secondary | ICD-10-CM | POA: Diagnosis present

## 2021-01-15 DIAGNOSIS — Z7984 Long term (current) use of oral hypoglycemic drugs: Secondary | ICD-10-CM | POA: Insufficient documentation

## 2021-01-15 DIAGNOSIS — I10 Essential (primary) hypertension: Secondary | ICD-10-CM | POA: Diagnosis not present

## 2021-01-15 NOTE — Discharge Instructions (Signed)
Apply antibiotic ointment 2 times per day until healed.

## 2021-01-15 NOTE — ED Provider Notes (Signed)
[facility] Emergency Department Provider Note  ____________________________________________  Time seen: Approximately 7:18 PM  I have reviewed the triage vital signs and the nursing notes.   HISTORY  Chief Complaint Skin Check   HPI Kyle Richards is a 75 y.o. male who presents to the emergency department for evaluation of a dark area on his forearm.   He noticed this 2 days ago.  He is concerned that there is a tick embedded under his skin and he has been picking at the area but has not been able to pull anything out of it.  He states that the tics are everywhere at his house.  He states that he was here last month and was treated with doxycycline after removing a tick. He is worried that he will get Lyme disease or Alpha-gal syndrome.  Past Medical History:  Diagnosis Date  . Diabetes ([place])   . Heart attack ([place]) 2012  . High cholesterol   . Hypertension   . Neuropathy     There are no problems to display for this patient.   Past Surgical History:  Procedure Laterality Date  . FINGER FRACTURE SURGERY Left    middle finger left hand  . TONSILLECTOMY AND ADENOIDECTOMY  1953    Prior to Admission medications   Medication Sig Start Date End Date Taking? Authorizing Provider  aspirin 81 MG chewable tablet Chew 81 mg by mouth daily.    [provider]  atorvastatin (LIPITOR) 20 MG tablet Take 20 mg by mouth daily.    [provider]  finasteride (PROPECIA) 1 MG tablet Take by mouth.    [provider]  lisinopril (PRINIVIL,ZESTRIL) 5 MG tablet Take 5 mg by mouth daily.    [provider]  metFORMIN (GLUCOPHAGE) 500 MG tablet Take 500 mg by mouth 2 (two) times daily. 03/20/20   [provider]  metoprolol tartrate (LOPRESSOR) 25 MG tablet Take 25 mg by mouth 2 (two) times daily.     [provider]  tamsulosin (FLOMAX) 0.4 MG CAPS capsule Take 1 capsule (0.4 mg total) by mouth daily. 06/14/18    Zara Council A, PA-C    Allergies Patient has no known allergies.  No family history on file.  Social History Social History   Tobacco Use  . Smoking status: Never Smoker  . Smokeless tobacco: Never Used  Vaping Use  . Vaping Use: Never used  Substance Use Topics  . Alcohol use: Yes    Comment: 1 beer monthly  . Drug use: Never    Review of Systems  Constitutional: Negative for fever. Respiratory: Negative for cough or shortness of breath.  Musculoskeletal: Negative for myalgias Skin: Positive for abrasion Neurological: Negative for numbness or paresthesias. ____________________________________________   PHYSICAL EXAM:  VITAL SIGNS: ED Triage Vitals [01/15/21 1549]  Enc Vitals Group     BP (!) 149/80     Pulse Rate 70     Resp 18     Temp 98 F (36.7 C)     Temp Source Oral     SpO2 100 %     Weight 195 lb 1.7 oz (88.5 kg)     Height 5\' 9"  (1.753 m)     Head Circumference      Peak Flow      Pain Score 0     Pain Loc      Pain Edu?      Excl. in [place]?      Constitutional: Well appearing.  Eyes: Conjunctivae are clear without discharge or drainage. Nose: No rhinorrhea noted. Mouth/Throat: Airway is patent.  Neck: No stridor. Unrestricted range of motion observed. Cardiovascular: Capillary refill is <3 seconds.  Respiratory: Respirations are even and unlabored.. Musculoskeletal: Unrestricted range of motion observed. Neurologic: Awake, alert, and oriented x 4.  Skin:  Less than .5cm superficial abrasion with dark scab on left forearm with local surrounding erythema. No foreign body/tick.  ____________________________________________   LABS (all labs ordered are listed, but only abnormal results are displayed)  Labs Reviewed - No data to display ____________________________________________  EKG  Not indicated. ____________________________________________  RADIOLOGY  Not  indicated. ____________________________________________   PROCEDURES  Procedures ____________________________________________   INITIAL IMPRESSION / ASSESSMENT AND PLAN / ED COURSE  Kyle Richards is a 75 y.o. male presenting to the emergency department for evaluation of a skin lesion.  See HPI for further details.  On exam, there is no foreign body or tick identified.  There is a small abrasion with some surrounding erythema.  Patient states that he has been picking at it and squeezing it to see if there is a tick embedded under the skin.  Patient was advised to apply antibiotic ointment to the area twice per day until healed.  He was encouraged to see primary care or return to the emergency department if he feels that the area has become infected or he has other issues of concern.   Medications - No data to display   Pertinent labs & imaging results that were available during my care of the patient were reviewed by me and considered in my medical decision making (see chart for details).  ____________________________________________   FINAL CLINICAL IMPRESSION(S) / ED DIAGNOSES  Final diagnoses:  Abrasion of left forearm, initial encounter    ED Discharge Orders    None       Note:  This document was prepared using Dragon voice recognition software and may include unintentional dictation errors.   Victorino Dike, FNP 01/15/21 1927    Vladimir Crofts, MD 01/19/21 [phone]

## 2021-01-15 NOTE — ED Triage Notes (Signed)
To ED via POV from potential tick bite to left inner forearm since Sunday night. No tick visible. Potential small abrasion to left inner forearm.

## 2021-01-15 NOTE — ED Notes (Signed)
Pt verbalized understanding of d/c instructions at this time. Follow-up care reviewed at this time. Pt ambulatory to ED lobby, NAD noted.

## 2021-01-22 ENCOUNTER — Other Ambulatory Visit: Payer: Self-pay

## 2021-01-22 ENCOUNTER — Encounter (HOSPITAL_COMMUNITY): Payer: Self-pay | Admitting: Emergency Medicine

## 2021-01-22 ENCOUNTER — Emergency Department (HOSPITAL_COMMUNITY): Payer: No Typology Code available for payment source

## 2021-01-22 ENCOUNTER — Emergency Department (HOSPITAL_COMMUNITY)
Admission: EM | Admit: 2021-01-22 | Discharge: 2021-01-23 | Disposition: A | Discharge status: Home / Self Care | Payer: No Typology Code available for payment source | Attending: Emergency Medicine | Admitting: Emergency Medicine

## 2021-01-22 DIAGNOSIS — I10 Essential (primary) hypertension: Secondary | ICD-10-CM | POA: Insufficient documentation

## 2021-01-22 DIAGNOSIS — E119 Type 2 diabetes mellitus without complications: Secondary | ICD-10-CM | POA: Insufficient documentation

## 2021-01-22 DIAGNOSIS — Z79899 Other long term (current) drug therapy: Secondary | ICD-10-CM | POA: Diagnosis not present

## 2021-01-22 DIAGNOSIS — Z7982 Long term (current) use of aspirin: Secondary | ICD-10-CM | POA: Diagnosis not present

## 2021-01-22 DIAGNOSIS — Z7984 Long term (current) use of oral hypoglycemic drugs: Secondary | ICD-10-CM | POA: Insufficient documentation

## 2021-01-22 DIAGNOSIS — I959 Hypotension, unspecified: Secondary | ICD-10-CM | POA: Insufficient documentation

## 2021-01-22 DIAGNOSIS — Z711 Person with feared health complaint in whom no diagnosis is made: Secondary | ICD-10-CM | POA: Diagnosis not present

## 2021-01-22 LAB — BASIC METABOLIC PANEL
Anion gap: 10 (ref 5–15)
BUN: 18 mg/dL (ref 8–23)
CO2: 24 mmol/L (ref 22–32)
Calcium: 9.5 mg/dL (ref 8.9–10.3)
Chloride: 100 mmol/L (ref 98–111)
Creatinine, Ser: 1.34 mg/dL — ABNORMAL HIGH (ref 0.61–1.24)
GFR, Estimated: 56 mL/min — ABNORMAL LOW (ref >60)
Glucose, Bld: 197 mg/dL — ABNORMAL HIGH (ref 70–99)
Potassium: 4.4 mmol/L (ref 3.5–5.1)
Sodium: 134 mmol/L — ABNORMAL LOW (ref 135–145)

## 2021-01-22 LAB — CBC WITH DIFFERENTIAL/PLATELET
Abs Immature Granulocytes: 0.02 10*3/uL (ref 0.00–0.07)
Basophils Absolute: 0 10*3/uL (ref 0.0–0.1)
Basophils Relative: 1 %
Eosinophils Absolute: 0 10*3/uL (ref 0.0–0.5)
Eosinophils Relative: 0 %
HCT: 46.9 % (ref 39.0–52.0)
Hemoglobin: 15.6 g/dL (ref 13.0–17.0)
Immature Granulocytes: 0 %
Lymphocytes Relative: 17 %
Lymphs Abs: 1.2 10*3/uL (ref 0.7–4.0)
MCH: 29.5 pg (ref 26.0–34.0)
MCHC: 33.3 g/dL (ref 30.0–36.0)
MCV: 88.7 fL (ref 80.0–100.0)
Monocytes Absolute: 0.4 10*3/uL (ref 0.1–1.0)
Monocytes Relative: 6 %
Neutro Abs: 5.5 10*3/uL (ref 1.7–7.7)
Neutrophils Relative %: 76 %
Platelets: 279 10*3/uL (ref 150–400)
RBC: 5.29 MIL/uL (ref 4.22–5.81)
RDW: 13.1 % (ref 11.5–15.5)
WBC: 7.2 10*3/uL (ref 4.0–10.5)
nRBC: 0 % (ref 0.0–0.2)

## 2021-01-22 NOTE — ED Triage Notes (Signed)
Pt states he checks his BP at home, noticed it was lower than usual, 98 systolic. Reports a tremor at that time. Denies chest pain/shortness of breath, nausea/vomiting/diarrhea, cough, headache or fever.

## 2021-01-22 NOTE — ED Provider Notes (Signed)
Emergency Medicine Provider Triage Evaluation Note  Hiawatha Merriott , a 75 y.o. male  was evaluated in triage.  Pt complains of low blood pressure at home today.  Accompanied by diaphoresis.  Review of Systems  Positive: Hypotension, diaphoresis Negative: Chest pain, shortness of breath, syncope, dizziness  Physical Exam  BP 120/88 (BP Location: Left Arm)   Pulse 100   Temp 98.9 F (37.2 C) (Oral)   Resp 16   SpO2 97%  Gen:   Awake, no distress   Resp:  Normal effort  MSK:   Moves extremities without difficulty  Other:    Medical Decision Making  Medically screening exam initiated at 6:16 PM.  Appropriate orders placed.  [name] was informed that the remainder of the evaluation will be completed by another provider, this initial triage assessment does not replace that evaluation, and the importance of remaining in the ED until their evaluation is complete.     Lorayne Bender, PA-C 01/22/21 1818    Drenda Freeze, MD 01/23/21 [phone]

## 2021-01-23 NOTE — Discharge Instructions (Addendum)
Your blood pressure has not been low while in the ED, and your positional blood pressures are also normal or mildly elevated. No serious condition has been identified and you can be discharged home safely.   Recommend keeping a log of your blood pressures, taking a measurement in the morning, the afternoon and the evening and reviewing this information with your doctor in 1-2 weeks.

## 2021-01-23 NOTE — ED Provider Notes (Signed)
[place] EMERGENCY DEPARTMENT Provider Note   CSN: [id number] Arrival date & time: 01/22/21  1734     History Chief Complaint  Patient presents with  . Hypotension    Kyle Richards is a 75 y.o. male.  Patient with history of CAD, x6 CABG 2011, T2DM, HTN, HLD, polyneuropathy, neurogenic bladder with intermittent self cath, presents with concern for low blood pressure. He reports that earlier this evening, he noticed his hand had a tremor when holding an object and he developed a sweat. He checked his BP and found reading c/w 98-101/70's-80's, which he states is lower than his usual. He did not check his blood sugar because he states symptoms started after he had eaten dinner. No chest pain, nausea, vomiting, SOB. No recent medications or dose changes.   The history is provided by the patient. No language interpreter was used.       Past Medical History:  Diagnosis Date  . Diabetes ([place])   . Heart attack ([place]) 2012  . High cholesterol   . Hypertension   . Neuropathy     There are no problems to display for this patient.   Past Surgical History:  Procedure Laterality Date  . FINGER FRACTURE SURGERY Left    middle finger left hand  . TONSILLECTOMY AND ADENOIDECTOMY  1953       No family history on file.  Social History   Tobacco Use  . Smoking status: Never Smoker  . Smokeless tobacco: Never Used  Vaping Use  . Vaping Use: Never used  Substance Use Topics  . Alcohol use: Yes    Comment: 1 beer monthly  . Drug use: Never    Home Medications Prior to Admission medications   Medication Sig Start Date End Date Taking? Authorizing Provider  aspirin 81 MG chewable tablet Chew 81 mg by mouth daily.    [provider]  atorvastatin (LIPITOR) 20 MG tablet Take 20 mg by mouth daily.    [provider]  finasteride (PROPECIA) 1 MG tablet Take by mouth.    [provider]  lisinopril (PRINIVIL,ZESTRIL) 5 MG tablet  Take 5 mg by mouth daily.    [provider]  metFORMIN (GLUCOPHAGE) 500 MG tablet Take 500 mg by mouth 2 (two) times daily. 03/20/20   [provider]  metoprolol tartrate (LOPRESSOR) 25 MG tablet Take 25 mg by mouth 2 (two) times daily.     [provider]  tamsulosin (FLOMAX) 0.4 MG CAPS capsule Take 1 capsule (0.4 mg total) by mouth daily. 06/14/18   Zara Council A, PA-C    Allergies    Patient has no known allergies.  Review of Systems   Review of Systems  Constitutional: Positive for diaphoresis. Negative for chills and fever.  HENT: Negative.   Respiratory: Negative.  Negative for shortness of breath.   Cardiovascular: Negative.  Negative for chest pain.  Gastrointestinal: Negative.   Musculoskeletal: Negative.   Skin: Negative.   Neurological: Positive for tremors.    Physical Exam Updated Vital Signs BP 140/84 (BP Location: Right Arm)   Pulse 92   Temp 98.9 F (37.2 C) (Oral)   Resp 18   SpO2 94%   Physical Exam Vitals and nursing note reviewed.  Constitutional:      Appearance: Normal appearance. He is well-developed. He is obese. He is not diaphoretic.  HENT:     Head: Normocephalic.  Cardiovascular:     Rate and Rhythm: Normal rate and regular  rhythm.     Heart sounds: No murmur heard.   Pulmonary:     Effort: Pulmonary effort is normal.     Breath sounds: Normal breath sounds. No wheezing, rhonchi or rales.  Abdominal:     General: Bowel sounds are normal.     Palpations: Abdomen is soft.     Tenderness: There is no abdominal tenderness. There is no guarding or rebound.  Musculoskeletal:        General: Normal range of motion.     Cervical back: Normal range of motion and neck supple.     Right lower leg: No edema.     Left lower leg: No edema.  Skin:    General: Skin is warm and dry.     Findings: No rash.  Neurological:     Mental Status: He is alert and oriented to person, place, and time.     ED Results /  Procedures / Treatments   Labs (all labs ordered are listed, but only abnormal results are displayed) Labs Reviewed  BASIC METABOLIC PANEL - Abnormal; Notable for the following components:      Result Value   Sodium 134 (*)    Glucose, Bld 197 (*)    Creatinine, Ser 1.34 (*)    GFR, Estimated 56 (*)    All other components within normal limits  CBC WITH DIFFERENTIAL/PLATELET    EKG EKG Interpretation  Date/Time:  Tuesday Jan 22 2021 18:05:58 EDT Ventricular Rate:  99 PR Interval:  154 QRS Duration: 88 QT Interval:  334 QTC Calculation: 428 R Axis:   -10 Text Interpretation: Normal sinus rhythm Normal ECG Confirmed by Ripley Fraise [phone] on 01/23/2021 12:11:15 AM   Radiology DG Chest 2 View  Result Date: 01/22/2021 CLINICAL DATA:  Hypotension. EXAM: CHEST - 2 VIEW COMPARISON:  None. FINDINGS: The heart size and mediastinal contours are within normal limits. Hypoinflation of the lungs is noted with minimal bibasilar subsegmental atelectasis. Sternotomy wires are noted. The visualized skeletal structures are unremarkable. IMPRESSION: Hypoinflation of the lungs with minimal bibasilar subsegmental atelectasis. Electronically Signed   By: Marijo Conception M.D.   On: 01/22/2021 18:56    Procedures Procedures   Medications Ordered in ED Medications - No data to display  ED Course  I have reviewed the triage vital signs and the nursing notes.  Pertinent labs & imaging results that were available during my care of the patient were reviewed by me and considered in my medical decision making (see chart for details).    MDM Rules/Calculators/A&P                          Patient to ED with concern for low blood pressure readings at home associated with sweating and mild UE tremor.   Blood pressure here is mildly elevated. Orthostatic blood pressures normal without any hypotension. EKG without acute finding. CXR clear.   Patient has been seen and evaluated by Dr. Christy Gentles. He  is felt appropriate for discharge home. Encourage keeping a log of blood pressure 3 times daily and reviewing these with PCP in one week.   Final Clinical Impression(s) / ED Diagnoses Final diagnoses:  None   1. Worried well 2. History of HTN  Rx / DC Orders ED Discharge Orders    None       [name] Bible 01/23/21 0126    Ripley Fraise, MD 01/23/21 [phone]

## 2021-03-02 ENCOUNTER — Emergency Department: Payer: No Typology Code available for payment source

## 2021-03-02 ENCOUNTER — Emergency Department
Admission: EM | Admit: 2021-03-02 | Discharge: 2021-03-02 | Disposition: A | Discharge status: Home / Self Care | Payer: No Typology Code available for payment source | Attending: Emergency Medicine | Admitting: Emergency Medicine

## 2021-03-02 ENCOUNTER — Other Ambulatory Visit: Payer: Self-pay

## 2021-03-02 DIAGNOSIS — E119 Type 2 diabetes mellitus without complications: Secondary | ICD-10-CM | POA: Diagnosis not present

## 2021-03-02 DIAGNOSIS — Z7984 Long term (current) use of oral hypoglycemic drugs: Secondary | ICD-10-CM | POA: Insufficient documentation

## 2021-03-02 DIAGNOSIS — Z7982 Long term (current) use of aspirin: Secondary | ICD-10-CM | POA: Diagnosis not present

## 2021-03-02 DIAGNOSIS — I10 Essential (primary) hypertension: Secondary | ICD-10-CM | POA: Diagnosis not present

## 2021-03-02 DIAGNOSIS — Z79899 Other long term (current) drug therapy: Secondary | ICD-10-CM | POA: Insufficient documentation

## 2021-03-02 DIAGNOSIS — N451 Epididymitis: Secondary | ICD-10-CM | POA: Insufficient documentation

## 2021-03-02 DIAGNOSIS — N50811 Right testicular pain: Secondary | ICD-10-CM | POA: Diagnosis present

## 2021-03-02 DIAGNOSIS — N50819 Testicular pain, unspecified: Secondary | ICD-10-CM

## 2021-03-02 LAB — URINALYSIS, COMPLETE (UACMP) WITH MICROSCOPIC
Bacteria, UA: NONE SEEN
Bilirubin Urine: NEGATIVE
Glucose, UA: NEGATIVE mg/dL
Hgb urine dipstick: NEGATIVE
Ketones, ur: NEGATIVE mg/dL
Leukocytes,Ua: NEGATIVE
Nitrite: NEGATIVE
Protein, ur: NEGATIVE mg/dL
Specific Gravity, Urine: 1.026 (ref 1.005–1.030)
pH: 5 (ref 5.0–8.0)

## 2021-03-02 MED ORDER — CEFTRIAXONE SODIUM 1 G IJ SOLR
1.0000 g | Freq: Once | INTRAMUSCULAR | Status: AC
  Administered 2021-03-02: 1 g via INTRAMUSCULAR
  Filled 2021-03-02: qty 10

## 2021-03-02 MED ORDER — LEVOFLOXACIN 500 MG PO TABS: 500.0000 mg | ORAL_TABLET | Freq: Every day | ORAL | 0 refills | Status: AC

## 2021-03-02 NOTE — ED Notes (Signed)
Pt taken to ultrasound

## 2021-03-02 NOTE — ED Provider Notes (Signed)
[facility] Emergency Department Provider Note  ____________________________________________  Time seen: Approximately 5:23 PM  I have reviewed the triage vital signs and the nursing notes.   HISTORY  Chief Complaint Testicle Pain    HPI Kyle Richards is a 75 y.o. male who presents to the emergency department complaining of right testicle pain.  He states that he has to self cath due to a neurogenic bladder.  His bladder has more than 350 mL of urine he can urinate once it decreases past 350 cannot pass the remaining of urine so he self caths.  Patient started developing right testicle pain.  He states that he has a history of epididymitis from cathing and he feels that this is similar to his previous.  No pelvic pain.  No flank pain.  No fevers or chills.  No changes in urinary habits.       Past Medical History:  Diagnosis Date   Diabetes ([place])    Heart attack ([place]) 2012   High cholesterol    Hypertension    Neuropathy     There are no problems to display for this patient.   Past Surgical History:  Procedure Laterality Date   FINGER FRACTURE SURGERY Left    middle finger left hand   TONSILLECTOMY AND ADENOIDECTOMY  1953    Prior to Admission medications   Medication Sig Start Date End Date Taking? Authorizing Provider  levofloxacin (LEVAQUIN) 500 MG tablet Take 1 tablet (500 mg total) by mouth daily for 10 days. 03/02/21 03/12/21 Yes Adi Seales, Charline Bills, PA-C  aspirin 81 MG chewable tablet Chew 81 mg by mouth daily.    [provider]  atorvastatin (LIPITOR) 20 MG tablet Take 20 mg by mouth daily.    [provider]  finasteride (PROPECIA) 1 MG tablet Take by mouth.    [provider]  lisinopril (PRINIVIL,ZESTRIL) 5 MG tablet Take 5 mg by mouth daily.    [provider]  metFORMIN (GLUCOPHAGE) 500 MG tablet Take 500 mg by mouth 2 (two) times daily. 03/20/20   [provider]  metoprolol tartrate  (LOPRESSOR) 25 MG tablet Take 25 mg by mouth 2 (two) times daily.     [provider]  tamsulosin (FLOMAX) 0.4 MG CAPS capsule Take 1 capsule (0.4 mg total) by mouth daily. 06/14/18   Zara Council A, PA-C    Allergies Patient has no known allergies.  No family history on file.  Social History Social History   Tobacco Use   Smoking status: Never   Smokeless tobacco: Never  Vaping Use   Vaping Use: Never used  Substance Use Topics   Alcohol use: Yes    Comment: 1 beer monthly   Drug use: Never     Review of Systems  Constitutional: No fever/chills Eyes: No visual changes. No discharge ENT: No upper respiratory complaints. Cardiovascular: no chest pain. Respiratory: no cough. No SOB. Gastrointestinal: No abdominal pain.  No nausea, no vomiting.  No diarrhea.  No constipation. Genitourinary: Negative for dysuria. No hematuria.  Positive for right testicle pain Musculoskeletal: Negative for musculoskeletal pain. Skin: Negative for rash, abrasions, lacerations, ecchymosis. Neurological: Negative for headaches, focal weakness or numbness.  10 System ROS otherwise negative.  ____________________________________________   PHYSICAL EXAM:  VITAL SIGNS: ED Triage Vitals  Enc Vitals Group     BP 03/02/21 1417 123/74     Pulse Rate 03/02/21 1417 85     Resp 03/02/21 1417 18     Temp  03/02/21 1417 99.1 F (37.3 C)     Temp Source 03/02/21 1417 Oral     SpO2 03/02/21 1417 95 %     Weight 03/02/21 1417 195 lb (88.5 kg)     Height 03/02/21 1417 5\' 9"  (1.753 m)     Head Circumference --      Peak Flow --      Pain Score 03/02/21 1424 4     Pain Loc --      Pain Edu? --      Excl. in [place]? --      Constitutional: Alert and oriented. Well appearing and in no acute distress. Eyes: Conjunctivae are normal. PERRL. EOMI. Head: Atraumatic. ENT:      Ears:       Nose: No congestion/rhinnorhea.      Mouth/Throat: Mucous membranes are moist.  Neck: No stridor.     Cardiovascular: Normal rate, regular rhythm. Normal S1 and S2.  Good peripheral circulation. Respiratory: Normal respiratory effort without tachypnea or retractions. Lungs CTAB. Good air entry to the bases with no decreased or absent breath sounds. Gastrointestinal: Bowel sounds 4 quadrants. Soft and nontender to palpation. No guarding or rigidity. No palpable masses. No distention. No CVA tenderness. Urogenital: Patient declines Musculoskeletal: Full range of motion to all extremities. No gross deformities appreciated. Neurologic:  Normal speech and language. No gross focal neurologic deficits are appreciated.  Skin:  Skin is warm, dry and intact. No rash noted. Psychiatric: Mood and affect are normal. Speech and behavior are normal. Patient exhibits appropriate insight and judgement.   ____________________________________________   LABS (all labs ordered are listed, but only abnormal results are displayed)  Labs Reviewed  URINALYSIS, COMPLETE (UACMP) WITH MICROSCOPIC - Abnormal; Notable for the following components:      Result Value   Color, Urine YELLOW (*)    APPearance CLEAR (*)    All other components within normal limits   ____________________________________________  EKG   ____________________________________________  RADIOLOGY I personally viewed and evaluated these images as part of my medical decision making, as well as reviewing the written report by the radiologist.  ED Provider Interpretation: Findings on ultrasound revealed findings consistent with epididymitis with no evidence of torsion.  US SCROTUM W/DOPPLER  Result Date: 03/02/2021 CLINICAL DATA:  Right testicle pain EXAM: SCROTAL ULTRASOUND DOPPLER ULTRASOUND OF THE TESTICLES TECHNIQUE: Complete ultrasound examination of the testicles, epididymis, and other scrotal structures was performed. Color and spectral Doppler ultrasound were also utilized to evaluate blood flow to the testicles. COMPARISON:  None.  FINDINGS: Right testicle Measurements: 4.2 x 1.9 x 2.9 cm. No mass or microlithiasis visualized. Left testicle Measurements: 4.6 x 1.9 x 2.3 cm. No mass or microlithiasis visualized. Right epididymis: There are 2 cysts within the right epididymis measuring up to 8 mm. Enlargement of the right epididymal tail is noted with increased blood flow concerning for epididymitis. Left epididymis: Small hypoechoic area associated with the right epididymis measures 1.3 x 1.31.7 cm. Nonspecific. Hydrocele:  Small bilateral Varicocele:  None visualized. Pulsed Doppler interrogation of both testes demonstrates normal low resistance arterial and venous waveforms bilaterally. IMPRESSION: 1. No signs of testicular torsion or mass. 2. Right sided epididymitis. 3. Small bilateral hydroceles. Electronically Signed   By: Kerby Moors M.D.   On: 03/02/2021 16:21    ____________________________________________    PROCEDURES  Procedure(s) performed:    Procedures    Medications  cefTRIAXone (ROCEPHIN) injection 1 g (1 g Intramuscular Given 03/02/21 1740)     ____________________________________________  INITIAL IMPRESSION / ASSESSMENT AND PLAN / ED COURSE  Pertinent labs & imaging results that were available during my care of the patient were reviewed by me and considered in my medical decision making (see chart for details).  Review of the [place] CSRS was performed in accordance of the [place] prior to dispensing any controlled drugs.           Patient's diagnosis is consistent with epididymitis.  Patient presents to the emergency department with right testicle pain.  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  Patient states that he has a neurogenic bladder and has to self cath to completely empty his bladder.  Patient has had epididymitis in the past from this and states that this pain is similar.  Findings on ultrasound were consistent with epididymitis.  I will treat the  patient with Rocephin and Levaquin.  He has urology and may follow-up with them.  Return precautions are discussed with the patient.  Patient will return for any worsening symptoms..  Patient is given ED precautions to return to the ED for any worsening or new symptoms.     ____________________________________________  FINAL CLINICAL IMPRESSION(S) / ED DIAGNOSES  Final diagnoses:  Testicle pain  Epididymitis      NEW MEDICATIONS STARTED DURING THIS VISIT:  ED Discharge Orders          Ordered    levofloxacin (LEVAQUIN) 500 MG tablet  Daily        03/02/21 1757                This chart was dictated using voice recognition software/Dragon. Despite best efforts to proofread, errors can occur which can change the meaning. Any change was purely unintentional.    Darletta Moll, PA-C 03/02/21 1757    Harvest Dark, MD 03/02/21 [phone]

## 2021-03-02 NOTE — ED Triage Notes (Signed)
Pt presents via POV c/o right testicle pain. Pt reports similar hx in past of having symptoms of left testicle with dx of epididymitis. Pt reports he self- caths. Ambulatory to triage.

## 2021-03-20 NOTE — Progress Notes (Deleted)
Cardiology Office Note:    Date:  03/20/2021   ID:  Kyle Richards, DOB 12-13-1945, MRN [id number]  PCP:  Clinic, [facility] Providers Cardiologist:  None {    Referring MD: [facility], [facility]    History of Present Illness:    Kyle Richards is a 75 y.o. male with a hx of CAD s/p CABG in 2011, HTN, HLD, DMII, , and neurogenic bladder who was referred by Dr. Phoebe Sharps for further evaluation of hypotension.  Was seen in the ER 01/23/21 where he presented with a hand tremor found to have soft blood pressures. Orthostatics in ER normal. He was reassured and discharged home.  Today,  Past Medical History:  Diagnosis Date   Diabetes ([place])    Heart attack ([place]) 2012   High cholesterol    Hypertension    Neuropathy     Past Surgical History:  Procedure Laterality Date   FINGER FRACTURE SURGERY Left    middle finger left hand   TONSILLECTOMY AND ADENOIDECTOMY  1953    Current Medications: No outpatient medications have been marked as taking for the 03/22/21 encounter (Appointment) with Kyle Bergeron, MD.     Allergies:   Patient has no known allergies.   Social History   Socioeconomic History   Marital status: Single    Spouse name: Not on file   Number of children: 2   Years of education: Not on file   Highest education level: Not on file  Occupational History   Not on file  Tobacco Use   Smoking status: Never   Smokeless tobacco: Never  Vaping Use   Vaping Use: Never used  Substance and Sexual Activity   Alcohol use: Yes    Comment: 1 beer monthly   Drug use: Never   Sexual activity: Not on file  Other Topics Concern   Not on file  Social History Narrative   Not on file   Social Determinants of Health   Financial Resource Strain: Not on file  Food Insecurity: Not on file  Transportation Needs: Not on file  Physical Activity: Not on file  Stress: Not on file  Social Connections: Not on file     Family  History: The patient's ***family history is not on file.  ROS:   Please see the history of present illness.    *** All other systems reviewed and are negative.  EKGs/Labs/Other Studies Reviewed:    The following studies were reviewed today: ***  EKG:  EKG is *** ordered today.  The ekg ordered today demonstrates ***  Recent Labs: 01/22/2021: BUN 18; Creatinine, Ser 1.34; Hemoglobin 15.6; Platelets 279; Potassium 4.4; Sodium 134  Recent Lipid Panel No results found for: CHOL, TRIG, HDL, CHOLHDL, VLDL, LDLCALC, LDLDIRECT   Risk Assessment/Calculations:   {Does this patient have ATRIAL FIBRILLATION?[phone]}       Physical Exam:    VS:  There were no vitals taken for this visit.    Wt Readings from Last 3 Encounters:  03/02/21 195 lb (88.5 kg)  01/15/21 195 lb 1.7 oz (88.5 kg)  11/23/20 195 lb (88.5 kg)     GEN: *** Well nourished, well developed in no acute distress HEENT: Normal NECK: No JVD; No carotid bruits LYMPHATICS: No lymphadenopathy CARDIAC: ***RRR, no murmurs, rubs, gallops RESPIRATORY:  Clear to auscultation without rales, wheezing or rhonchi  ABDOMEN: Soft, non-tender, non-distended MUSCULOSKELETAL:  No edema; No deformity  SKIN: Warm and dry NEUROLOGIC:  Alert and  oriented x 3 PSYCHIATRIC:  Normal affect   ASSESSMENT:    No diagnosis found. PLAN:    In order of problems listed above:  #?Hypotension:  #CAD s/p CABG on 2011: -Conitnue metop 25mg  BID (?change to XL) -Continue lisinopril 5mg  daily -Continue lipitor 20mg  daily -?ASA  #HTN: -Conitnue metop 25mg  BID (?change to XL) -Continue lisinopril 5mg  daily  #HLD: -Continue lipitor 20mg  daily -Followed at the [place]  #DMII: -Continue metformin 500mg  BID  {Are you ordering a CV Procedure (e.g. stress test, cath, DCCV, TEE, etc)?   Press F2        :[id number]}    Medication Adjustments/Labs and Tests Ordered: Current medicines are reviewed at length with the patient today.  Concerns  regarding medicines are outlined above.  No orders of the defined types were placed in this encounter.  No orders of the defined types were placed in this encounter.   There are no Patient Instructions on file for this visit.   Signed, Kyle Bergeron, MD  03/20/2021 1:41 PM    [place]

## 2021-03-22 ENCOUNTER — Ambulatory Visit (INDEPENDENT_AMBULATORY_CARE_PROVIDER_SITE_OTHER): Payer: Medicare Other | Admitting: Cardiology

## 2021-03-22 ENCOUNTER — Other Ambulatory Visit: Payer: Self-pay

## 2021-03-22 VITALS — BP 136/92 | HR 60 | Ht 69.0 in | Wt 205.0 lb

## 2021-03-22 DIAGNOSIS — Z951 Presence of aortocoronary bypass graft: Secondary | ICD-10-CM

## 2021-03-22 DIAGNOSIS — I10 Essential (primary) hypertension: Secondary | ICD-10-CM | POA: Diagnosis not present

## 2021-03-22 DIAGNOSIS — I2510 Atherosclerotic heart disease of native coronary artery without angina pectoris: Secondary | ICD-10-CM

## 2021-03-22 DIAGNOSIS — I340 Nonrheumatic mitral (valve) insufficiency: Secondary | ICD-10-CM

## 2021-03-22 DIAGNOSIS — E1159 Type 2 diabetes mellitus with other circulatory complications: Secondary | ICD-10-CM

## 2021-03-22 DIAGNOSIS — I951 Orthostatic hypotension: Secondary | ICD-10-CM

## 2021-03-22 DIAGNOSIS — E782 Mixed hyperlipidemia: Secondary | ICD-10-CM

## 2021-03-22 MED ORDER — METOPROLOL SUCCINATE ER 25 MG PO TB24: 25.0000 mg | ORAL_TABLET | Freq: Every day | ORAL | 3 refills | Status: AC

## 2021-03-22 NOTE — Progress Notes (Signed)
Cardiology Office Note:    Date:  03/22/2021   ID:  Kyle Richards, DOB April 25, 1946, MRN [id number]  PCP:  Clinic, [facility] Providers Cardiologist:  None {    Referring MD: Clinic, [facility]    History of Present Illness:    Kyle Richards is a 75 y.o. male with a hx of CAD s/p CABG in 2011, HTN, HLD, DMII, , and neurogenic bladder who was referred by Kyle Richards for further evaluation of hypotension.  Was seen in the ER 01/23/21 where he presented with a hand tremor found to have soft blood pressures. Orthostatics in ER normal. He was reassured and discharged home.  Today, he is feeling well overall. He mentions that he saw a cardiologist in [place] when he went to visit his son and would like to establish a relationship with one here in [place]. He is overall doing well from a CV standpoint with no chest pain, lightheadendess, dizziness, orthopnea or PND. He has returned to work and is able to walk 3 flights of stairs with mild SOB. He states that he recently underwent a colonoscopy in [place] and reports he was told he stopped breathing twice during the procedure. He was concerned that this was related to heart disease but was told it was likely a result of the anesthesia.   He had CABG x6 in 2011. His cholesterol has been doing well. He has been managing his sugar levels with 500 mg metformin PO daily.  He has a history of heart disease. He had CABG x6 in 2011. His cholesterol has been doing well. He has been managing his sugar levels with 500 mg metformin PO daily. Blood pressure is well controlled at home despite being elevated in MD office. No orthostatic sumtpms. BP Readings from Last 3 Encounters:  03/22/21 (!) 136/92  03/02/21 (!) 143/69  01/23/21 134/75     He also mentions he was having urinary problems and was diagnosed with a neurogenic bladder and uses a catheter. He also has an enlarged prostate.  He denies chest  pain,palpitations, lightheaddness, headaches, syncope,orthopnea, PND.    Past Medical History:  Diagnosis Date   Diabetes ([place])    Heart attack ([place]) 2012   High cholesterol    Hypertension    Neuropathy     Past Surgical History:  Procedure Laterality Date   FINGER FRACTURE SURGERY Left    middle finger left hand   TONSILLECTOMY AND ADENOIDECTOMY  1953    Current Medications: Current Meds  Medication Sig   aspirin 81 MG chewable tablet Chew 81 mg by mouth daily.   atorvastatin (LIPITOR) 20 MG tablet Take 20 mg by mouth daily.   finasteride (PROPECIA) 1 MG tablet Take by mouth.   lisinopril (PRINIVIL,ZESTRIL) 5 MG tablet Take 5 mg by mouth daily.   metFORMIN (GLUCOPHAGE) 500 MG tablet Take 500 mg by mouth 2 (two) times daily.   metoprolol succinate (TOPROL XL) 25 MG 24 hr tablet Take 1 tablet (25 mg total) by mouth daily.   tamsulosin (FLOMAX) 0.4 MG CAPS capsule Take 1 capsule (0.4 mg total) by mouth daily.   [DISCONTINUED] metoprolol tartrate (LOPRESSOR) 25 MG tablet Take 25 mg by mouth 2 (two) times daily.      Allergies:   Patient has no known allergies.   Social History   Socioeconomic History   Marital status: Single    Spouse name: Not on file   Number of children: 2   Years of  education: Not on file   Highest education level: Not on file  Occupational History   Not on file  Tobacco Use   Smoking status: Never   Smokeless tobacco: Never  Vaping Use   Vaping Use: Never used  Substance and Sexual Activity   Alcohol use: Yes    Comment: 1 beer monthly   Drug use: Never   Sexual activity: Not on file  Other Topics Concern   Not on file  Social History Narrative   Not on file   Social Determinants of Health   Financial Resource Strain: Not on file  Food Insecurity: Not on file  Transportation Needs: Not on file  Physical Activity: Not on file  Stress: Not on file  Social Connections: Not on file     Family History: The patient's family history  is not on file.  ROS:   Review of Systems  Constitutional:  Negative for fever and malaise/fatigue.  HENT:  Negative for congestion and ear pain.   Eyes:  Negative for pain.  Respiratory:  Positive for shortness of breath. Negative for cough and wheezing.   Cardiovascular:  Positive for leg swelling. Negative for chest pain, palpitations, orthopnea, claudication and PND.  Gastrointestinal:  Negative for constipation, diarrhea, nausea and vomiting.  Genitourinary:  Negative for dysuria and frequency.  Musculoskeletal:  Negative for myalgias.  Neurological:  Negative for headaches.  Endo/Heme/Allergies:  Negative for environmental allergies.  Psychiatric/Behavioral:  Negative for depression. The patient is not nervous/anxious.     EKGs/Labs/Other Studies Reviewed:    The following studies were reviewed today:  TTE: 12/12/2020 EF [id number] Grade 2 diastolic dysfunction Moderate left atrial enlargement Moderate MR with ERO 0.18 cm squared R vol: 103m  Mild TR  EKG:   03/22/2021: Not completed.   Recent Labs: 01/22/2021: BUN 18; Creatinine, Ser 1.34; Hemoglobin 15.6; Platelets 279; Potassium 4.4; Sodium 134  Recent Lipid Panel No results found for: CHOL, TRIG, HDL, CHOLHDL, VLDL, LDLCALC, LDLDIRECT   Risk Assessment/Calculations:           Physical Exam:    VS:  BP (!) 136/92   Pulse 60   Ht '5\' 9"'$  (1.753 m)   Wt 205 lb (93 kg)   SpO2 95%   BMI 30.27 kg/m     Wt Readings from Last 3 Encounters:  03/22/21 205 lb (93 kg)  03/02/21 195 lb (88.5 kg)  01/15/21 195 lb 1.7 oz (88.5 kg)     GEN: Well nourished, well developed in no acute distress HEENT: Normal NECK: No JVD; No carotid bruits CARDIAC: RRR, 1/6 soft murmur in LUSB. No rubs, gallops RESPIRATORY:  Clear to auscultation without rales, wheezing or rhonchi  ABDOMEN: Soft, non-tender, non-distended MUSCULOSKELETAL:  No edema; No deformity  SKIN: Warm and dry NEUROLOGIC:  Alert and oriented x 3 PSYCHIATRIC:   Normal affect   ASSESSMENT:    1. Coronary artery disease involving native coronary artery of native heart without angina pectoris   2. Moderate mitral insufficiency   3. Hx of CABG   4. Primary hypertension   5. Mixed hyperlipidemia   6. Type 2 diabetes mellitus with other circulatory complication, without long-term current use of insulin (HCC)    PLAN:    In order of problems listed above:  #Episode of low blood pressure/suspected orthostasis: Presented to ER in 12/2020 with soft blood pressures. Orthostatics negative and work-up reassruing. No current symptoms.  -Resolved  #CAD s/p CABG on 2011: Doing well without anginal symptoms. Tolerating medications  as prescribed. TTE  11/2020 with LVEF 55-60%, no WMA, G2DD, moderate MR.  -Change metop tartrate to metop succinate '25mg'$  XL daily -Continue lisinopril '5mg'$  daily -Continue lipitor '20mg'$  daily -Continue ASA '81mg'$  daily  #Moderate MR: No HF symptoms. -Will need repeat TTE in 2024  #HTN: Well controlled at home with no orthostatic symptoms -Change metop tartrate to metop succinate '25mg'$  XL daily -Continue lisinopril '5mg'$  daily  #HLD: -Continue lipitor '20mg'$  daily -Labs monitored at [place]; goal LDL<70  #DMII: -Continue metformin '500mg'$  BID  #Neurogenic Bladder: -Self catheters     Follow-up in a year.  Medication Adjustments/Labs and Tests Ordered: Current medicines are reviewed at length with the patient today.  Concerns regarding medicines are outlined above.  No orders of the defined types were placed in this encounter.  Meds ordered this encounter  Medications   metoprolol succinate (TOPROL XL) 25 MG 24 hr tablet    Sig: Take 1 tablet (25 mg total) by mouth daily.    Dispense:  90 tablet    Refill:  3    Patient Instructions  Medication Instructions:   STOP TAKING METOPROLOL TARTRATE (LOPRESSOR) NOW  START TAKING METOPROLOL SUCCINATE (TOPROL XL) 25 MG BY MOUTH DAILY  *If you need a refill on your cardiac  medications before your next appointment, please call your pharmacy*   Follow-Up: At [facility], you and your health needs are our priority.  As part of our continuing mission to provide you with exceptional heart care, we have created designated Provider Care Teams.  These Care Teams include your primary Cardiologist (physician) and Advanced Practice Providers (APPs -  Physician Assistants and Nurse Practitioners) who all work together to provide you with the care you need, when you need it.  We recommend signing up for the patient portal called "MyChart".  Sign up information is provided on this After Visit Summary.  MyChart is used to connect with patients for Virtual Visits (Telemedicine).  Patients are able to view lab/test results, encounter notes, upcoming appointments, etc.  Non-urgent messages can be sent to your provider as well.   To learn more about what you can do with MyChart, go to [email or web address].    Your next appointment:   1 year(s)  The format for your next appointment:   In Person  Provider:   Gwyndolyn Kaufman, MD       I,Zite Okoli,acting as a scribe for Freada Bergeron, MD.,have documented all relevant documentation on the behalf of Freada Bergeron, MD,as directed by  Freada Bergeron, MD while in the presence of Freada Bergeron, MD.   I, Freada Bergeron, MD, have reviewed all documentation for this visit. The documentation on 03/22/21 for the exam, diagnosis, procedures, and orders are all accurate and complete.   Signed, Freada Bergeron, MD  03/22/2021 11:24 AM    [place]

## 2021-03-22 NOTE — Patient Instructions (Signed)
Medication Instructions:   STOP TAKING METOPROLOL TARTRATE (LOPRESSOR) NOW  START TAKING METOPROLOL SUCCINATE (TOPROL XL) 25 MG BY MOUTH DAILY  *If you need a refill on your cardiac medications before your next appointment, please call your pharmacy*   Follow-Up: At [facility], you and your health needs are our priority.  As part of our continuing mission to provide you with exceptional heart care, we have created designated Provider Care Teams.  These Care Teams include your primary Cardiologist (physician) and Advanced Practice Providers (APPs -  Physician Assistants and Nurse Practitioners) who all work together to provide you with the care you need, when you need it.  We recommend signing up for the patient portal called "MyChart".  Sign up information is provided on this After Visit Summary.  MyChart is used to connect with patients for Virtual Visits (Telemedicine).  Patients are able to view lab/test results, encounter notes, upcoming appointments, etc.  Non-urgent messages can be sent to your provider as well.   To learn more about what you can do with MyChart, go to [email or web address].    Your next appointment:   1 year(s)  The format for your next appointment:   In Person  Provider:   Gwyndolyn Kaufman, MD

## 2021-03-25 ENCOUNTER — Encounter (HOSPITAL_BASED_OUTPATIENT_CLINIC_OR_DEPARTMENT_OTHER): Payer: Self-pay

## 2021-06-25 NOTE — Progress Notes (Signed)
06/26/2021 4:07 PM   Kyle Richards Romeo Kyle Richards June 13, 1946 [id number]  Referring provider: [facility] [address] [place],  [place] [address]  Chief Complaint  Patient presents with   Follow-up   Urological history: 1. High risk hematuria -non-smoker -RUS 06/2020 Normal ultrasound appearance of the kidneys-Enlarged prostate -cysto 06/2020 Enlarged prostate with significant bilobar coaptation - Mild bladder neck -no reports of gross heme -UA negative for micro heme  2. Incomplete emptying -managed with CIC x 2-3  3. Bilateral hydroceles -scrotal ultrasound 03/2021 - small bilateral hydroceles  4. Epididymitis -scrotal ultrasound 03/2021 - right sided epididymitis  5. BPH with incomplete bladder emptying -PSA 1.6 in 2019  -prostate volume 45 cc  HPI: Kyle Richards is a 75 y.o. male who presents today for yearly appointment.  UA benign.  He is cathing two to three times daily.    He has no issues with self cathing.    [name]  Patient denies any gross hematuria, dysuria or suprapubic/flank pain.  Patient denies any fevers, chills, nausea or vomiting.    He gets his prescription for his catheters through the [place].    PMH: Past Medical History:  Diagnosis Date   Diabetes ([place])    Heart attack ([place]) 2012   High cholesterol    Hypertension    Neuropathy     Surgical History: Past Surgical History:  Procedure Laterality Date   FINGER FRACTURE SURGERY Left    middle finger left hand   TONSILLECTOMY AND ADENOIDECTOMY  1953    Home Medications:  Allergies as of 06/26/2021   No Known Allergies      Medication List        Accurate as of June 26, 2021 11:59 PM. If you have any questions, ask your nurse or doctor.          aspirin 81 MG chewable tablet Chew 81 mg by mouth daily.   atorvastatin 20 MG tablet Commonly known as: LIPITOR Take 20 mg by mouth daily.   finasteride 1 MG  tablet Commonly known as: PROPECIA Take by mouth.   finasteride 5 MG tablet Commonly known as: PROSCAR TAKE ONE TABLET BY MOUTH IN THE MORNING FOR PROSTATE   lisinopril 5 MG tablet Commonly known as: ZESTRIL Take 5 mg by mouth daily.   metFORMIN 500 MG tablet Commonly known as: GLUCOPHAGE Take 500 mg by mouth 2 (two) times daily.   metoprolol succinate 25 MG 24 hr tablet Commonly known as: Toprol XL Take 1 tablet (25 mg total) by mouth daily.   tamsulosin 0.4 MG Caps capsule Commonly known as: FLOMAX Take 1 capsule (0.4 mg total) by mouth daily.        Allergies: No Known Allergies  Family History: No family history on file.  Social History:  reports that he has never smoked. He has never used smokeless tobacco. He reports current alcohol use. He reports that he does not use drugs.  ROS: Pertinent ROS in HPI  Physical Exam: BP (!) 142/86   Pulse 78   Ht 5\' 9"  (1.753 m)   Wt 197 lb (89.4 kg)   BMI 29.09 kg/m   Constitutional:  Well nourished. Alert and oriented, No acute distress. HEENT: [place] AT, mask in place.  Trachea midline Cardiovascular: No clubbing, cyanosis, or edema. Respiratory: Normal respiratory effort, no increased work of breathing. Neurologic: Grossly intact, no focal deficits, moving all 4 extremities. Psychiatric: Normal mood and affect.  Laboratory Data:  Lab Results  Component Value Date   WBC 7.2 01/22/2021   HGB 15.6 01/22/2021   HCT 46.9 01/22/2021   MCV 88.7 01/22/2021   PLT 279 01/22/2021    Lab Results  Component Value Date   CREATININE 1.34 (H) 01/22/2021    Urinalysis Component     Latest Ref Rng & Units 06/26/2021  Specific Gravity, UA     1.005 - 1.030 >1.030 (H)  pH, UA     5.0 - 7.5 5.5  Color, UA     Yellow Yellow  Appearance Ur     Clear Clear  Leukocytes,UA     Negative Negative  Protein,UA     Negative/Trace Negative  Glucose, UA     Negative Trace (A)  Ketones, UA     Negative Negative  RBC, UA      Negative Negative  Bilirubin, UA     Negative Negative  Urobilinogen, Ur     0.2 - 1.0 mg/dL 1.0  Nitrite, UA     Negative Negative  Microscopic Examination      See below:   Component     Latest Ref Rng & Units 06/26/2021  WBC, UA     0 - 5 /hpf 0-5  RBC     0 - 2 /hpf None seen  Epithelial Cells (non renal)     0 - 10 /hpf 0-10  Bacteria, UA     None seen/Few None seen  I have reviewed the labs.   Pertinent Imaging: N/A  Assessment & Plan:    1. Incomplete bladder emptying  -managed with CIC x 2-3  2. Actinic keratosis -advised by the [facility] PCP to see dermatology, but the wait is too long at the [place] to see a dermatology at the [facility] -I placed a referral to dermatology for him    Return in about 1 year (around 06/26/2022) for follow up .   These notes generated with voice recognition software. I apologize for typographical  Zara Council, [facility]  [place] [address]  [place], [place] [address] [phone]

## 2021-06-26 ENCOUNTER — Other Ambulatory Visit: Payer: Self-pay

## 2021-06-26 ENCOUNTER — Encounter: Payer: Self-pay | Admitting: Urology

## 2021-06-26 ENCOUNTER — Ambulatory Visit (INDEPENDENT_AMBULATORY_CARE_PROVIDER_SITE_OTHER): Payer: Medicare Other | Admitting: Urology

## 2021-06-26 VITALS — BP 142/86 | HR 78 | Ht 69.0 in | Wt 197.0 lb

## 2021-06-26 DIAGNOSIS — R339 Retention of urine, unspecified: Secondary | ICD-10-CM

## 2021-06-26 DIAGNOSIS — L570 Actinic keratosis: Secondary | ICD-10-CM

## 2021-06-26 LAB — URINALYSIS, COMPLETE
Bilirubin, UA: NEGATIVE
Ketones, UA: NEGATIVE
Leukocytes,UA: NEGATIVE
Nitrite, UA: NEGATIVE
Protein,UA: NEGATIVE
RBC, UA: NEGATIVE
Specific Gravity, UA: >1.03 — ABNORMAL HIGH (ref 1.005–1.030)
Urobilinogen, Ur: 1 mg/dL (ref 0.2–1.0)
pH, UA: 5.5 (ref 5.0–7.5)

## 2021-06-26 LAB — MICROSCOPIC EXAMINATION
Bacteria, UA: NONE SEEN
RBC, Urine: NONE SEEN /hpf (ref 0–2)

## 2021-08-28 ENCOUNTER — Observation Stay
Admission: EM | Admit: 2021-08-28 | Discharge: 2021-08-30 | Disposition: A | Discharge status: Home / Self Care | Payer: Non-veteran care | Attending: Internal Medicine | Admitting: Internal Medicine

## 2021-08-28 ENCOUNTER — Emergency Department: Payer: Non-veteran care

## 2021-08-28 DIAGNOSIS — Z7984 Long term (current) use of oral hypoglycemic drugs: Secondary | ICD-10-CM | POA: Insufficient documentation

## 2021-08-28 DIAGNOSIS — E1140 Type 2 diabetes mellitus with diabetic neuropathy, unspecified: Secondary | ICD-10-CM | POA: Insufficient documentation

## 2021-08-28 DIAGNOSIS — Z951 Presence of aortocoronary bypass graft: Secondary | ICD-10-CM | POA: Insufficient documentation

## 2021-08-28 DIAGNOSIS — Z79899 Other long term (current) drug therapy: Secondary | ICD-10-CM

## 2021-08-28 DIAGNOSIS — N319 Neuromuscular dysfunction of bladder, unspecified: Secondary | ICD-10-CM | POA: Insufficient documentation

## 2021-08-28 DIAGNOSIS — E860 Dehydration: Secondary | ICD-10-CM

## 2021-08-28 DIAGNOSIS — N179 Acute kidney failure, unspecified: Principal | ICD-10-CM | POA: Insufficient documentation

## 2021-08-28 DIAGNOSIS — I2510 Atherosclerotic heart disease of native coronary artery without angina pectoris: Secondary | ICD-10-CM | POA: Insufficient documentation

## 2021-08-28 DIAGNOSIS — Z885 Allergy status to narcotic agent status: Secondary | ICD-10-CM | POA: Insufficient documentation

## 2021-08-28 DIAGNOSIS — Z7901 Long term (current) use of anticoagulants: Secondary | ICD-10-CM

## 2021-08-28 DIAGNOSIS — Z87440 Personal history of urinary (tract) infections: Secondary | ICD-10-CM | POA: Insufficient documentation

## 2021-08-28 DIAGNOSIS — Z7982 Long term (current) use of aspirin: Secondary | ICD-10-CM | POA: Insufficient documentation

## 2021-08-28 DIAGNOSIS — N400 Enlarged prostate without lower urinary tract symptoms: Secondary | ICD-10-CM | POA: Insufficient documentation

## 2021-08-28 DIAGNOSIS — I10 Essential (primary) hypertension: Secondary | ICD-10-CM | POA: Insufficient documentation

## 2021-08-28 DIAGNOSIS — R069 Unspecified abnormalities of breathing: Secondary | ICD-10-CM

## 2021-08-28 DIAGNOSIS — E119 Type 2 diabetes mellitus without complications: Secondary | ICD-10-CM

## 2021-08-28 DIAGNOSIS — E785 Hyperlipidemia, unspecified: Secondary | ICD-10-CM | POA: Insufficient documentation

## 2021-08-28 DIAGNOSIS — N3001 Acute cystitis with hematuria: Secondary | ICD-10-CM

## 2021-08-28 LAB — COMPREHENSIVE METABOLIC PANEL
ALT: 21 U/L (ref 0–55)
AST (SGOT): 14 U/L (ref 10–42)
Albumin/Globulin Ratio: 1.25 Ratio (ref 0.80–2.00)
Albumin: 4.5 gm/dL (ref 3.5–5.0)
Alkaline Phosphatase: 83 U/L (ref 40–145)
Anion Gap: 20.1 mMol/L — ABNORMAL HIGH (ref 7.0–18.0)
BUN / Creatinine Ratio: 17.4 Ratio (ref 10.0–30.0)
BUN: 29 mg/dL — ABNORMAL HIGH (ref 7–22)
Bilirubin, Total: 0.9 mg/dL (ref 0.1–1.2)
CO2: 22 mMol/L (ref 20–30)
Calcium: 10.3 mg/dL (ref 8.5–10.5)
Chloride: 101 mMol/L (ref 98–110)
Creatinine: 1.67 mg/dL — ABNORMAL HIGH (ref 0.80–1.30)
EGFR: 42 mL/min/{1.73_m2} — ABNORMAL LOW (ref 60–150)
Globulin: 3.6 gm/dL (ref 2.0–4.0)
Glucose: 167 mg/dL — ABNORMAL HIGH (ref 71–99)
Osmolality Calculated: 287 mOsm/kg (ref 275–300)
Potassium: 4.1 mMol/L (ref 3.5–5.3)
Protein, Total: 8.1 gm/dL (ref 6.0–8.3)
Sodium: 139 mMol/L (ref 136–147)

## 2021-08-28 LAB — PT/INR
PT INR: 1 (ref 0.9–1.1)
PT: 10.3 s (ref 9.4–11.5)

## 2021-08-28 LAB — VH URINALYSIS WITH MICROSCOPIC AND CULTURE IF INDICATED
Bilirubin, UA: NEGATIVE
Glucose, UA: 50 mg/dL — AB
Hyaline Casts, UA: >20 /lpf — AB
Ketones UA: NEGATIVE mg/dL
Leukocyte Esterase, UA: 25 Leu/uL — AB
Nitrite, UA: NEGATIVE
Protein, UR: 30 mg/dL — AB
RBC, UA: 71 /hpf — ABNORMAL HIGH (ref 0–4)
Renal Epithel, UA: <1 /hpf (ref 0–1)
Squam Epithel, UA: 4 /hpf — ABNORMAL HIGH (ref 0–2)
Trans Epithel, UA: 1 /hpf (ref 0–2)
Urine Specific Gravity: 1.027 (ref 1.001–1.040)
Urobilinogen, UA: 2 mg/dL — AB
WBC, UA: 12 /hpf — ABNORMAL HIGH (ref 0–4)
pH, Urine: 5 pH (ref 5.0–8.0)

## 2021-08-28 LAB — CBC AND DIFFERENTIAL
Basophils %: 0.4 % (ref 0.0–3.0)
Basophils Absolute: 0 10*3/uL (ref 0.0–0.3)
Eosinophils %: 0.5 % (ref 0.0–7.0)
Eosinophils Absolute: 0 10*3/uL (ref 0.0–0.8)
Hematocrit: 51.6 % (ref 39.0–52.5)
Hemoglobin: 16.4 gm/dL (ref 13.0–17.5)
Lymphocytes Absolute: 1.5 10*3/uL (ref 0.6–5.1)
Lymphocytes: 18.6 % (ref 15.0–46.0)
MCH: 30 pg (ref 28–35)
MCHC: 32 gm/dL (ref 32–36)
MCV: 95 fL (ref 80–100)
MPV: 8.4 fL (ref 6.0–10.0)
Monocytes Absolute: 0.4 10*3/uL (ref 0.1–1.7)
Monocytes: 5.5 % (ref 3.0–15.0)
Neutrophils %: 75.1 % (ref 42.0–78.0)
Neutrophils Absolute: 6 10*3/uL (ref 1.7–8.6)
PLT CT: 278 10*3/uL (ref 130–440)
RBC: 5.45 10*6/uL (ref 4.00–5.70)
RDW: 12 % (ref 11.0–14.0)
WBC: 8 10*3/uL (ref 4.0–11.0)

## 2021-08-28 LAB — B-TYPE NATRIURETIC PEPTIDE: B-Natriuretic Peptide: 16.4 pg/mL (ref 0.0–100.0)

## 2021-08-28 LAB — VH EXTRA SPECIMEN LABEL

## 2021-08-28 MED ORDER — SODIUM CHLORIDE (PF) 0.9 % IJ SOLN
1.0000 g | Freq: Once | INTRAMUSCULAR | Status: AC
Start: 2021-08-28 — End: 2021-08-28
  Administered 2021-08-28: 1 g via INTRAVENOUS

## 2021-08-28 MED ORDER — CEFTRIAXONE SODIUM 1 G IJ SOLR
INTRAMUSCULAR | Status: AC
Start: 2021-08-28 — End: ?
  Filled 2021-08-28: qty 1000

## 2021-08-28 MED ORDER — VH SODIUM CHLORIDE 0.9 % IV BOLUS
1000.0000 mL | Freq: Once | INTRAVENOUS | Status: AC
Start: 2021-08-28 — End: 2021-08-29
  Administered 2021-08-28: 1000 mL via INTRAVENOUS

## 2021-08-28 NOTE — ED Provider Notes (Signed)
[facility]  Emergency Department       Patient Name: Duane Thomas, Duane Thomas Patient DOB:  February 25, 1946   Encounter Date:  08/28/2021 Age: 75 y.o. male   Attending ED Physician: Dierdre Searles, D.O. MRN:  [id number]   Room:  C18/C18-A PCP: Pcp, None, MD      Diagnosis / Disposition:   Final Impression  1. Acute kidney injury    2. Dehydration    3. Acute cystitis with hematuria        Disposition  ED Disposition       ED Disposition   Admit    Condition   --    Date/Time   Thu Aug 29, 2021 12:00 AM    Comment   Service: Medicine [[id number]]                 Follow up  No follow-up provider specified.    Prescriptions  New Prescriptions    No medications on file         History of Presenting Illness:   Chief complaint: Dysuria    HPI/ROS is limited by: none  HPI/ROS given by: Patient    Location: urinary  Quality: decreased  Duration: x 2 days  Severity: moderate          Nursing Notes reviewed and acknowledged.    Duane Thomas is a 75 y.o. male who presents with decreased urination x 2 days. Patient has a history of  neurogenic bladder, DM, and neuropathy. He self-caths at home when necessary. Patient states he has the urge to urinate, dark urine, but has decreased urine output. He states he has increased stools after eating the last couple of days. He denies abdominal or back pain, bloody stools, recent travel, bad food, sick contacts, or recent antibiotics. His last UTI was about 1 year ago. He denies nausea, vomiting, fevers, chills.     In addition to the above history, please see nursing notes. Allergies, meds, past medical, family, social hx, and the results of the diagnostic studies performed have been reviewed by myself.      Review of Systems   General: No fevers/chills, No sweating  ENT:   No congestion. No sore throat.  Respiratory: No cough.  No shortness of breath.  Cardiovascular: No chest pain.  No palpitations.  GI: No Nausea, Vomiting, or abdominal pain, +increased stools, no bloody stools  GU:   No dysuria. No hematuria. +decreased urination, +dark urine, +urgency  Neurological:  No headache.  No weakness. No numbness  Musculoskeletal:  No back or extremity pain  Skin:  No rash.  No skin lesions.  Psychiatric:  No depression.  No anxiety.      All other systems reviewed and negative except as above, pertinent findings in HPI.          Allergies / Medications:   Pt is allergic to fentanyl.    Current/Home Medications    ASPIRIN 81 MG CHEWABLE TABLET    Chew 81 mg by mouth daily.    ATORVASTATIN (LIPITOR) 20 MG TABLET    Take 20 mg by mouth daily    CHOLECALCIFEROL (VITAMIN D) 1000 UNIT TABLET    Take 1,000 Units by mouth daily.    FINASTERIDE (PROPECIA) 1 MG TABLET    Take 5 mg by mouth daily       LISINOPRIL (ZESTRIL) 5 MG TABLET    Take 5 mg by mouth    METFORMIN (GLUCOPHAGE) 500 MG TABLET    Take  1 tablet (500 mg total) by mouth 2 (two) times daily with meals    METOPROLOL SUCCINATE XL (TOPROL-XL) 25 MG 24 HR TABLET    Take 25 mg by mouth 2 (two) times daily       TAMSULOSIN (FLOMAX) 0.4 MG CAP    Take 0.4 mg by mouth Daily after dinner    UNKNOWN TO PATIENT             Past History:   Medical: Pt has a past medical history of Coronary artery disease, Enlarged prostate, Hyperlipidemia, Hypertension, Neurogenic amyotrophy, Neuropathy, and Type 2 diabetes mellitus, controlled.    Surgical: Pt  has a past surgical history that includes Tonsillectomy; left elbow; Colonoscopy (N/A, 01/09/2015); Cardiac surgery; Colonoscopy (N/A, 08/09/2018); and Coronary artery bypass graft.    Family: The family history includes No known problems in his father and mother.    Social: Pt reports that he has never smoked. He has never used smokeless tobacco. He reports that he does not drink alcohol and does not use drugs.      Physical Exam:   Constitutional: Vital signs reviewed. NAD  Head: Normocephalic, atraumatic  Eyes: Conjunctiva and sclera are normal.  No injection or discharge.  Ears, Nose, Throat:  Normal external  examination of the nose and ears. +dry MM   Neck: Supple. No JVD.  Respiratory/Chest: No respiratory distress. Breath sounds clear to auscultation  Cardiac: regular rate and rhythm  Abdomen:  soft and nontender  Back:  no CVAT  Extremities:  no edema, positive pulses with good capillary refill, good ROM  Neurological: No sensory or motor deficits. Speech normal.  Skin: Warm and dry. No rash.  Psychiatric: Alert and conversant.  Normal affect.          MDM:   The differential diagnosis includes, but is not limited to UTI, urethritis, GC/chlamydia, urinary retention, prostatitis, appendicitis, testicular torsion, orchitis, epididymitis, renal colic      Course in ED:   Patient is given IVF and started on Rocephin for UTI. Case discussed with hospitalist who accepts admission for AKI.      Diagnostic Results:   The results of the diagnostic studies have been reviewed by myself:    Radiologic Studies  US Renal Kidney Bladder Complete    Result Date: 08/28/2021  Normal exam. ReadingStation:[email or web address]     Lab Studies  Labs Reviewed   COMPREHENSIVE METABOLIC PANEL - Abnormal; Notable for the following components:       Result Value    Glucose 167 (*)     Creatinine 1.67 (*)     BUN 29 (*)     Anion Gap 20.1 (*)     EGFR 42 (*)     All other components within normal limits   VH URINALYSIS WITH MICROSCOPIC AND CULTURE IF INDICATED       - Abnormal; Notable for the following components:    Color, UA Amber - Color may affect some analytes. (*)     Protein, UR 30 (*)     Glucose, UA 50 (*)     Blood, UA Moderate (*)     Urobilinogen, UA 2.0 (*)     Leukocyte Esterase, UA 25 (*)     WBC, UA 12 (*)     RBC, UA 71 (*)     Squam Epithel, UA 4 (*)     Hyaline Casts, UA >20 (*)     All other components within normal limits    Narrative:  A Urine Culture has been ordered based upon the Positive UA results.   VH CULTURE, URINE   VH EXTRA SPECIMEN LABEL   CBC AND DIFFERENTIAL   B-TYPE NATRIURETIC PEPTIDE   PT/INR             EKG/  Procedure / Critical Care time:   EKG: none          Total time providing critical care: none      ATTESTATIONS   This chart was generated by an EMR and may contain errors or additions/omissions not intended by the user.       Dierdre Searles, D.O.     Dierdre Searles, DO  08/31/21 1819

## 2021-08-29 DIAGNOSIS — N179 Acute kidney failure, unspecified: Secondary | ICD-10-CM | POA: Diagnosis present

## 2021-08-29 LAB — VH DEXTROSE STICK GLUCOSE
Glucose POCT: 240 mg/dL — ABNORMAL HIGH (ref 71–99)
Glucose POCT: 176 mg/dL — ABNORMAL HIGH (ref 71–99)
Glucose POCT: 125 mg/dL — ABNORMAL HIGH (ref 71–99)
Glucose POCT: 126 mg/dL — ABNORMAL HIGH (ref 71–99)
Glucose POCT: 139 mg/dL — ABNORMAL HIGH (ref 71–99)

## 2021-08-29 LAB — CBC AND DIFFERENTIAL
Basophils %: 0.3 % (ref 0.0–3.0)
Basophils Absolute: 0 10*3/uL (ref 0.0–0.3)
Eosinophils %: 1.1 % (ref 0.0–7.0)
Eosinophils Absolute: 0.1 10*3/uL (ref 0.0–0.8)
Hematocrit: 42.1 % (ref 39.0–52.5)
Hemoglobin: 13.8 gm/dL (ref 13.0–17.5)
Lymphocytes Absolute: 2.4 10*3/uL (ref 0.6–5.1)
Lymphocytes: 30.5 % (ref 15.0–46.0)
MCH: 31 pg (ref 28–35)
MCHC: 33 gm/dL (ref 32–36)
MCV: 94 fL (ref 80–100)
MPV: 8.6 fL (ref 6.0–10.0)
Monocytes Absolute: 0.7 10*3/uL (ref 0.1–1.7)
Monocytes: 8.4 % (ref 3.0–15.0)
Neutrophils %: 59.7 % (ref 42.0–78.0)
Neutrophils Absolute: 4.7 10*3/uL (ref 1.7–8.6)
PLT CT: 235 10*3/uL (ref 130–440)
RBC: 4.47 10*6/uL (ref 4.00–5.70)
RDW: 11.9 % (ref 11.0–14.0)
WBC: 7.8 10*3/uL (ref 4.0–11.0)

## 2021-08-29 LAB — COMPREHENSIVE METABOLIC PANEL
ALT: 15 U/L (ref 0–55)
AST (SGOT): 12 U/L (ref 10–42)
Albumin/Globulin Ratio: 1.21 Ratio (ref 0.80–2.00)
Albumin: 3.5 gm/dL (ref 3.5–5.0)
Alkaline Phosphatase: 64 U/L (ref 40–145)
Anion Gap: 12.6 mMol/L (ref 7.0–18.0)
BUN / Creatinine Ratio: 21.1 Ratio (ref 10.0–30.0)
BUN: 24 mg/dL — ABNORMAL HIGH (ref 7–22)
Bilirubin, Total: 0.6 mg/dL (ref 0.1–1.2)
CO2: 21 mMol/L (ref 20.0–30.0)
Calcium: 8.8 mg/dL (ref 8.5–10.5)
Chloride: 106 mMol/L (ref 98–110)
Creatinine: 1.14 mg/dL (ref 0.80–1.30)
EGFR: 67 mL/min/{1.73_m2} (ref 60–150)
Globulin: 2.9 gm/dL (ref 2.0–4.0)
Glucose: 155 mg/dL — ABNORMAL HIGH (ref 71–99)
Osmolality Calculated: 279 mOsm/kg (ref 275–300)
Potassium: 3.6 mMol/L (ref 3.5–5.3)
Protein, Total: 6.4 gm/dL (ref 6.0–8.3)
Sodium: 136 mMol/L (ref 136–147)

## 2021-08-29 LAB — VH URINALYSIS WITH MICROSCOPIC AND CULTURE IF INDICATED
Bilirubin, UA: NEGATIVE mg/dL
Blood, UA: NEGATIVE mg/dL
Glucose, UA: NEGATIVE mg/dL
Ketones UA: NEGATIVE
Nitrite, UA: NEGATIVE
Protein, UR: NEGATIVE mg/dL
RBC, UA: 2 /hpf (ref 0–4)
Squam Epithel, UA: 2 /hpf (ref 0–2)
Urine Specific Gravity: 1.01 (ref 1.005–1.030)
Urobilinogen, UA: 0.2 mg/dL
WBC, UA: 4 /hpf (ref 0–4)
pH, Urine: 5.5 pH (ref 5.0–8.0)

## 2021-08-29 LAB — HEMOGLOBIN A1C
Estimated Average Glucose: 160 mg/dL
Hgb A1C, %: 7.2 %

## 2021-08-29 LAB — PHOSPHORUS: Phosphorus: 2.8 mg/dL (ref 2.3–4.7)

## 2021-08-29 LAB — MAGNESIUM: Magnesium: 1.8 mg/dL (ref 1.6–2.6)

## 2021-08-29 MED ORDER — FINASTERIDE 5 MG PO TABS
5.0000 mg | ORAL_TABLET | Freq: Every evening | ORAL | Status: DC
Start: 2021-08-29 — End: 2021-08-30
  Administered 2021-08-29: 22:00:00 5 mg via ORAL
  Filled 2021-08-29: qty 1

## 2021-08-29 MED ORDER — METOPROLOL SUCCINATE ER 25 MG PO TB24
25.0000 mg | ORAL_TABLET | Freq: Two times a day (BID) | ORAL | Status: DC
Start: 2021-08-29 — End: 2021-08-30
  Administered 2021-08-29 – 2021-08-30 (×3): 25 mg via ORAL
  Filled 2021-08-29 (×3): qty 1

## 2021-08-29 MED ORDER — VH HEPARIN SODIUM (PORCINE) 5000 UNIT/ML IJ SOLN
5000.0000 [IU] | Freq: Two times a day (BID) | INTRAMUSCULAR | Status: DC
Start: 2021-08-29 — End: 2021-08-30
  Administered 2021-08-29 – 2021-08-30 (×3): 5000 [IU] via SUBCUTANEOUS
  Filled 2021-08-29 (×3): qty 1

## 2021-08-29 MED ORDER — INSULIN LISPRO (1 UNIT DIAL) 100 UNIT/ML SC SOPN
1.0000 [IU] | PEN_INJECTOR | Freq: Every day | SUBCUTANEOUS | Status: DC | PRN
Start: 2021-08-29 — End: 2021-08-30

## 2021-08-29 MED ORDER — INSULIN LISPRO (1 UNIT DIAL) 100 UNIT/ML SC SOPN
1.0000 [IU] | PEN_INJECTOR | Freq: Three times a day (TID) | SUBCUTANEOUS | Status: DC
Start: 2021-08-29 — End: 2021-08-30

## 2021-08-29 MED ORDER — SODIUM CHLORIDE (PF) 0.9 % IJ SOLN
3.0000 mL | Freq: Two times a day (BID) | INTRAMUSCULAR | Status: DC
Start: 2021-08-29 — End: 2021-08-30
  Administered 2021-08-29 – 2021-08-30 (×4): 3 mL via INTRAVENOUS

## 2021-08-29 MED ORDER — GLUCAGON 1 MG IJ SOLR (WRAP)
1.0000 mg | INTRAMUSCULAR | Status: DC | PRN
Start: 2021-08-29 — End: 2021-08-30

## 2021-08-29 MED ORDER — SODIUM CHLORIDE 0.9 % IV SOLN
INTRAVENOUS | Status: DC
Start: 2021-08-29 — End: 2021-08-30

## 2021-08-29 MED ORDER — INSULIN LISPRO (1 UNIT DIAL) 100 UNIT/ML SC SOPN
1.0000 [IU] | PEN_INJECTOR | Freq: Every evening | SUBCUTANEOUS | Status: DC
Start: 2021-08-29 — End: 2021-08-30

## 2021-08-29 MED ORDER — SODIUM CHLORIDE (PF) 0.9 % IJ SOLN
1.0000 g | INTRAMUSCULAR | Status: DC
Start: 2021-08-29 — End: 2021-08-30
  Administered 2021-08-29: 23:00:00 1 g via INTRAVENOUS
  Filled 2021-08-29: qty 1000

## 2021-08-29 MED ORDER — TAMSULOSIN HCL 0.4 MG PO CAPS
0.4000 mg | ORAL_CAPSULE | Freq: Every day | ORAL | Status: DC
Start: 2021-08-29 — End: 2021-08-30
  Administered 2021-08-29 (×2): 0.4 mg via ORAL
  Filled 2021-08-29 (×2): qty 1

## 2021-08-29 MED ORDER — SODIUM CHLORIDE (PF) 0.9 % IJ SOLN
0.4000 mg | INTRAMUSCULAR | Status: DC | PRN
Start: 2021-08-29 — End: 2021-08-30

## 2021-08-29 MED ORDER — ASPIRIN 81 MG PO CHEW
81.0000 mg | CHEWABLE_TABLET | Freq: Every day | ORAL | Status: DC
Start: 2021-08-29 — End: 2021-08-30
  Administered 2021-08-29 – 2021-08-30 (×2): 81 mg via ORAL
  Filled 2021-08-29 (×2): qty 1

## 2021-08-29 MED ORDER — METFORMIN HCL 500 MG PO TABS
500.0000 mg | ORAL_TABLET | Freq: Two times a day (BID) | ORAL | Status: DC
Start: 2021-08-29 — End: 2021-08-30
  Administered 2021-08-29 – 2021-08-30 (×3): 500 mg via ORAL
  Filled 2021-08-29 (×3): qty 1

## 2021-08-29 MED ORDER — ATORVASTATIN CALCIUM 20 MG PO TABS
20.0000 mg | ORAL_TABLET | Freq: Every day | ORAL | Status: DC
Start: 2021-08-29 — End: 2021-08-30
  Administered 2021-08-29 – 2021-08-30 (×2): 20 mg via ORAL
  Filled 2021-08-29 (×2): qty 1

## 2021-08-29 MED ORDER — VITAMIN D 25 MCG (1000 UT) PO TABS
25.0000 ug | ORAL_TABLET | Freq: Every day | ORAL | Status: DC
Start: 2021-08-29 — End: 2021-08-30
  Administered 2021-08-29 – 2021-08-30 (×2): 25 ug via ORAL
  Filled 2021-08-29 (×2): qty 1

## 2021-08-29 MED ORDER — VH DEXTROSE 10 % IV BOLUS (ADULT)
125.0000 mL | INTRAVENOUS | Status: DC | PRN
Start: 2021-08-29 — End: 2021-08-30

## 2021-08-29 NOTE — Plan of Care (Addendum)
NURSE NOTE SUMMARY  [facility] - [facility] [address]   Patient Name: Duane Thomas   Attending Physician: Theda Sers, MD   Today's date:   08/29/2021 LOS: 0 days   Shift Summary:                                                              12/29 The Pt's vitals have been stable.  He has had no complaints of pain.  His blood glucose at 2200 was 139.  The pt was up to the bathroom and self cathed himself.     Provider Notifications:        Rapid Response Notifications:  Mobility:      PMP Activity: Step 6 - Walks in Room (08/29/2021  7:52 PM)     Weight tracking:  Family Dynamic:   Last 3 Weights for the past 72 hrs (Last 3 readings):   Weight   08/29/21 0406 89.8 kg (197 lb 15.6 oz)   08/28/21 1702 90.7 kg (199 lb 15.3 oz)             Last Bowel Movement   Last BM Date: 08/28/21       Problem: Fluid and Electrolyte Imbalance/ Endocrine  Goal: Fluid and electrolyte balance are achieved/maintained  Description: Interventions:  1. Monitor intake and output every shift  2. Monitor/assess lab values and report abnormal values  3. Provide adequate hydration  4. Assess for confusion/personality changes  5. Monitor daily weight  6. Assess and reassess fluid and electrolyte status  7. Observe for seizure activity and initiate seizure precautions if indicated  8. Observe for cardiac arrhythmias  9. Monitor for muscle weakness  Outcome: Progressing  Flowsheets (Taken 08/29/2021 2053)  Fluid and electrolyte balance are achieved/maintained:   Monitor intake and output every shift   Monitor/assess lab values and report abnormal values   Provide adequate hydration   Assess and reassess fluid and electrolyte status  Goal: Adequate hydration  Description: Interventions:  1. Assess mucus membranes, skin color, turgor, perfusion and presence of edema  2. Assess for peripheral, sacral, periorbital and abdominal edema  3. Monitor and assess vital signs and perfusion  Outcome: Progressing  Flowsheets (Taken  08/29/2021 2053)  Adequate hydration:   Assess for peripheral, sacral, periorbital and abdominal edema   Monitor and assess vital signs and perfusion     Problem: Moderate/High Fall Risk Score >5  Description: Fall Risk Score > 5  Goal: Patient will remain free of falls  Outcome: Progressing

## 2021-08-29 NOTE — Significant Event (Signed)
Patient seen and examined.    No complaints.    Able to have some urine output but cannot totally void, required self catheterizations 3 times a day.    AKI improved,  creatinine improved from 1.67-1.14.  Repeated UA is clear no clear signs of infection.    Continue IV fluid.  Continue empirical antibiotic treatment  Monitor renal function      Plan for discharge in a.m. if remains to stable.

## 2021-08-29 NOTE — Plan of Care (Addendum)
NURSE NOTE SUMMARY  [facility] [address]   Patient Name: Duane Thomas, Duane Thomas   Attending Physician: Lowella Grip I, MD   Today's date:   08/29/2021 LOS: 0 days   Shift Summary:                                                                0300- Pt admitted from the ER into Rm, oriented to the protocols of the unit. Assessment completed, alert and oriented. Iv access to the Rt AC infusing NS  Pt refused his insulin reported " I'm not on insulin therefore will not take it until I have clarify why I'm being put on it".  Successfully completed his 3oz water screening.  Pt brought home meds and have been sent to pharmacy for keeps.  Pt voiding in the BR self cath.   Provider Notifications:        Rapid Response Notifications:  Mobility:      PMP Activity: Step 6 - Walks in Room (08/29/2021  3:00 AM)     Weight tracking:  Family Dynamic:   Last 3 Weights for the past 72 hrs (Last 3 readings):   Weight   08/29/21 0406 89.8 kg (197 lb 15.6 oz)   08/28/21 1702 90.7 kg (199 lb 15.3 oz)             Last Bowel Movement   No data recorded       Problem: Fluid and Electrolyte Imbalance/ Endocrine  Goal: Fluid and electrolyte balance are achieved/maintained  Outcome: Progressing  Flowsheets (Taken 08/29/2021 0457)  Fluid and electrolyte balance are achieved/maintained:   Monitor intake and output every shift   Monitor/assess lab values and report abnormal values   Provide adequate hydration   Assess for confusion/personality changes   Assess and reassess fluid and electrolyte status   Monitor daily weight   Observe for seizure activity and initiate seizure precautions if indicated   Monitor for muscle weakness   Observe for cardiac arrhythmias  Goal: Adequate hydration  Outcome: Progressing  Flowsheets (Taken 08/29/2021 0457)  Adequate hydration:   Assess mucus membranes, skin color, turgor, perfusion and presence of edema   Assess for peripheral, sacral, periorbital and abdominal edema   Monitor  and assess vital signs and perfusion

## 2021-08-29 NOTE — Plan of Care (Addendum)
NURSE NOTE SUMMARY  [facility] [address]   Patient Name: Duane Thomas   Attending Physician: Theda Sers, MD   Today's date:   08/29/2021 LOS: 0 days   Shift Summary:                                                              0700- Received report, took over pt care, pt resting in bed, call bell in reach    Pt resting in bed, up ad lib. Calls before getting up to disconnect fluids  Pt doesn't c/o pain  IV intact infusing NS @ 100  Pt is voiding WNL in the BR, I/O caths himself  Neuro-vascular assessment is intact.  Vital  signs reviewed:    Temp: 97.2 F (36.2 C), Heart Rate: 75, Resp Rate: 17, BP: 129/75    Pt tolerating po well, call bell in reach, pt voices no concerns at this time           Provider Notifications:        Rapid Response Notifications:  Mobility:      PMP Activity: Step 6 - Walks in Room (08/29/2021  8:50 AM)     Weight tracking:  Family Dynamic:   Last 3 Weights for the past 72 hrs (Last 3 readings):   Weight   08/29/21 0406 89.8 kg (197 lb 15.6 oz)   08/28/21 1702 90.7 kg (199 lb 15.3 oz)             Last Bowel Movement   No data recorded        Problem: Fluid and Electrolyte Imbalance/ Endocrine  Goal: Fluid and electrolyte balance are achieved/maintained  Outcome: Progressing  Flowsheets (Taken 08/29/2021 0457 by Avel Sensor, RN)  Fluid and electrolyte balance are achieved/maintained:   Monitor intake and output every shift   Monitor/assess lab values and report abnormal values   Provide adequate hydration   Assess for confusion/personality changes   Assess and reassess fluid and electrolyte status   Monitor daily weight   Observe for seizure activity and initiate seizure precautions if indicated   Monitor for muscle weakness   Observe for cardiac arrhythmias  Goal: Adequate hydration  Outcome: Progressing  Flowsheets (Taken 08/29/2021 0457 by Avel Sensor, RN)  Adequate hydration:   Assess mucus membranes, skin color, turgor, perfusion and  presence of edema   Assess for peripheral, sacral, periorbital and abdominal edema   Monitor and assess vital signs and perfusion     Problem: Moderate/High Fall Risk Score >5  Goal: Patient will remain free of falls  Outcome: Progressing  Flowsheets (Taken 08/29/2021 0850)  VH Moderate Risk (6-13):   ALL REQUIRED LOW INTERVENTIONS   INITIATE YELLOW "FALL RISK" SIGNAGE   YELLOW NON-SKID SLIPPERS   YELLOW "FALL RISK" ARM BAND   USE OF BED EXIT ALARM IF PATIENT IS CONFUSED OR IMPULSIVE. PLACE RESET BED ALARM SIGN ABOVE BED   PLACE FALL RISK LEVEL ON WHITE BOARD FOR COMMUNICATION PURPOSES IN PATIENT'S ROOM   Use of "STOP ask for help" sign

## 2021-08-29 NOTE — ED Notes (Signed)
[facility] EMERGENCY DEPARTMENT  ED NURSING NOTE FOR THE RECEIVING INPATIENT NURSE   ED NURSE PHONE: [phone]    ADMISSION INFORMATION   Duane Thomas is a 75 y.o. male admitted with an ED diagnosis of:  1. Acute kidney injury    2. Dehydration    3. Acute cystitis with hematuria      ED Pre-Departure Assessment:  Updated on care and vitals    NURSING CARE   Isolation None   Home O2 No   Patient Comes From: Home Independent   Documents accompanying patient: not applicable   Mental Status: alert and oriented   Ambulation: no difficulty and Other:  self caths    Pertinent Info/Safety Concern: None   Report called to receiving RN?: No

## 2021-08-29 NOTE — H&P (Signed)
Medicine History & Physical   [facility]  [employer]   Patient Name: Duane Thomas, Duane Thomas: 0 days   Attending Physician: Theda Sers, MD PCP: Pcp, None, MD      Assessment and Plan:                                                                #Acute kidney injury likely prerenal  -Creatinine 1.67, BUN 29  -IV hydration  -Hold lisinopril  -Avoid nephrotoxic agents  -Continue to monitor kidney function    #Acute cystitis  -Afebrile hemodynamically stable  -No leukocytosis  -Urinalysis positive for WBC, leukocyte esterase  -Culture pending  -Started on ceftriaxone    #BPH  #Neurogenic bladder  -Patient uses self intermittent catheterization  -Continue tamsulosin finasteride    #Type 2 diabetes:  -Hold home metformin  -Insulin sliding scale Accu-Cheks    #Hypertension  Continue metoprolol, hold lisinopril due to AKI    DVT PPx: Medication VTE Prophylaxis Orders: heparin (porcine) (Heparin Sodium (Porcine)) injection 5,000 Units  Mechanical VTE Prophylaxis Orders: Reason for no mechanical VTE prophylaxis - Treatment not indicated  Dispo: Observation  Healthcare Proxy: Son  Code: full code     History of Presenting Illness                                CC: decreased urine output   Duane Thomas is a 75 y.o. male patient medical history of CAD status post CABG, hypertension, dyslipidemia, neurogenic bladder, BPH type 2 diabetes presented to the ED complaints of decreased urination.  Patient uses self intermittent catheterization for neurogenic bladder and he noticed yesterday that his urine output is decreasing and  his urine is dark I was told that he is in kidney failure will and dehydrated decided to come to the ED.  He denies any nausea, vomiting, diarrhea, fever, chills has been eating his normal diet  Etiology  History afebrile, labs remarkable for elevated creatinine 1.6 BUN 29 UA positive for infection patient admitted for management of AKI with cystitis  Past Medical History:    Diagnosis Date    Coronary artery disease     s/p CABG    Enlarged prostate     Hyperlipidemia     Hypertension     Neurogenic amyotrophy     Neuropathy     Type 2 diabetes mellitus, controlled      Past Surgical History:   Procedure Laterality Date    CARDIAC SURGERY      bypass x6    COLONOSCOPY N/A 01/09/2015    Procedure: COLONOSCOPY;  Surgeon: Chauncey Fischer, MD;  Location: [facility];  Service: Gastroenterology;  Laterality: N/A;    COLONOSCOPY N/A 08/09/2018    Procedure: COLONOSCOPY;  Surgeon: Chauncey Fischer, MD;  Location: [facility];  Service: Gastroenterology;  Laterality: N/A;    CORONARY ARTERY BYPASS GRAFT      left elbow      TONSILLECTOMY         Family History   Problem Relation Age of Onset    No known problems Mother     No known problems Father      Social History  Tobacco Use    Smoking status: Never    Smokeless tobacco: Never   Vaping Use    Vaping Use: Never used   Substance Use Topics    Alcohol use: No    Drug use: No            Subjective     Review of Systems:  Review of systems as noted in HPI. Full 12 point ROS otherwise negative.         Objective   Physical Exam:     Vitals: T:97.2 F (36.2 C) (Temporal),  BP:129/75, HR:75, RR:17, SaO2:95%    1) General Appearance: Alert and oriented x 4. In no acute distress.   2) Eyes: Pink conjunctiva, anicteric sclera. Pupils are equally reactive to light.  3) ENT: Oral mucosa moist with no pharyngeal congestion, erythema or swelling.  4) Neck: Supple, with full range of motion. Trachea is central, no JVD noted  5) Chest: Clear to auscultation bilaterally, no wheezes or rhonchi.  6) CVS: normal rate and regular rhythm, with no murmurs.  7) Abdomen: Soft, non-tender, no palpable mass. Bowel sounds normal. No CVA tenderness  8) Extremities: No pitting edema, pulses palpable, no calf swelling and no gross deformity.  9) Skin: Warm, dry with normal skin turgor, no rash  10) Lymphatics: No lymphadenopathy in axillary, cervical  and inguinal area.   11) Neurological: Cranial nerves II-XII intact. No gross focal motor or sensory deficits noted.  12) Psychiatric: Affect is appropriate. No hallucinations.        Patient Vitals for the past 12 hrs:   BP Temp Pulse Resp   08/29/21 0809 129/75 97.2 F (36.2 C) 75 17   08/29/21 0300 129/70 98.2 F (36.8 C) 70 16   08/29/21 0203 126/89 97.9 F (36.6 C) 83 --     Weight Monitoring 08/09/2018 08/26/2018 09/07/2019 03/20/2020 12/12/2020 08/28/2021 08/29/2021   Height 172.7 cm 175.3 cm 175.3 cm 172.7 cm 172.7 cm - 172.7 cm   Height Method - Stated - - - - Stated   Weight 90.719 kg 95.2 kg 83.915 kg 89.313 kg 92.987 kg 90.7 kg 89.8 kg   Weight Method - Actual - - - Actual Bed Scale   BMI (calculated) 30.5 kg/m2 31.1 kg/m2 27.4 kg/m2 30 kg/m2 31.2 kg/m2 - 30.2 kg/m2           LABS:  Estimated Creatinine Clearance: 61 mL/min (based on SCr of 1.14 mg/dL).   Recent Results (from the past 24 hour(s))   Collect Blood    Collection Time: 08/28/21  5:04 PM   Result Value Ref Range    Collect Blood Label Notification    CBC and differential    Collection Time: 08/28/21  6:30 PM   Result Value Ref Range    WBC 8.0 4.0 - 11.0 K/cmm    RBC 5.45 4.00 - 5.70 M/cmm    Hemoglobin 16.4 13.0 - 17.5 gm/dL    Hematocrit 54.0 98.1 - 52.5 %    MCV 95 80 - 100 fL    MCH 30 28 - 35 pg    MCHC 32 32 - 36 gm/dL    RDW 19.1 47.8 - 29.5 %    PLT CT 278 130 - 440 K/cmm    MPV 8.4 6.0 - 10.0 fL    Neutrophils % 75.1 42.0 - 78.0 %    Lymphocytes 18.6 15.0 - 46.0 %    Monocytes 5.5 3.0 - 15.0 %    Eosinophils %  0.5 0.0 - 7.0 %    Basophils % 0.4 0.0 - 3.0 %    Neutrophils Absolute 6.0 1.7 - 8.6 K/cmm    Lymphocytes Absolute 1.5 0.6 - 5.1 K/cmm    Monocytes Absolute 0.4 0.1 - 1.7 K/cmm    Eosinophils Absolute 0.0 0.0 - 0.8 K/cmm    Basophils Absolute 0.0 0.0 - 0.3 K/cmm   Comprehensive metabolic panel    Collection Time: 08/28/21  6:30 PM   Result Value Ref Range    Sodium 139 136 - 147 mMol/L    Potassium 4.1 3.5 - 5.3 mMol/L     Chloride 101 98 - 110 mMol/L    CO2 22 20 - 30 mMol/L    Calcium 10.3 8.5 - 10.5 mg/dL    Glucose 161 (H) 71 - 99 mg/dL    Creatinine 0.96 (H) 0.80 - 1.30 mg/dL    BUN 29 (H) 7 - 22 mg/dL    Protein, Total 8.1 6.0 - 8.3 gm/dL    Albumin 4.5 3.5 - 5.0 gm/dL    Alkaline Phosphatase 83 40 - 145 U/L    ALT 21 0 - 55 U/L    AST (SGOT) 14 10 - 42 U/L    Bilirubin, Total 0.9 0.1 - 1.2 mg/dL    Albumin/Globulin Ratio 1.25 0.80 - 2.00 Ratio    Anion Gap 20.1 (H) 7.0 - 18.0 mMol/L    BUN / Creatinine Ratio 17.4 10.0 - 30.0 Ratio    EGFR 42 (L) 60 - 150 mL/min/1.40m2    Osmolality Calculated 287 275 - 300 mOsm/kg    Globulin 3.6 2.0 - 4.0 gm/dL   B-type Natriuretic Peptide    Collection Time: 08/28/21  6:30 PM   Result Value Ref Range    B-Natriuretic Peptide 16.4 0.0 - 100.0 pg/mL   Prothrombin time/INR    Collection Time: 08/28/21  6:30 PM   Result Value Ref Range    PT 10.3 9.4 - 11.5 sec    PT INR 1.0 0.9 - 1.1   Urinalysis w Microscopic and Culture if Indicated    Collection Time: 08/28/21  6:30 PM    Specimen: Urine, Random   Result Value Ref Range    Color, UA Amber - Color may affect some analytes. (A) Colorless,Yellow,Straw    Clarity, UA Clear Clear    Urine Specific Gravity 1.027 1.001 - 1.040    pH, Urine 5.0 5.0 - 8.0 pH    Protein, UR 30 (A) Negative mg/dL    Glucose, UA 50 (A) Negative mg/dL    Ketones UA Negative Negative,5 mg/dL    Bilirubin, UA Negative Negative    Blood, UA Moderate (A) Negative    Nitrite, UA Negative Negative    Urobilinogen, UA 2.0 (A) Normal mg/dL    Leukocyte Esterase, UA 25 (A) Negative Leu/uL    UR Micro Performed     WBC, UA 12 (H) 0 - 4 /hpf    RBC, UA 71 (H) 0 - 4 /hpf    Squam Epithel, UA 4 (H) 0 - 2 /hpf    Trans Epithel, UA 1 0 - 2 /hpf    Renal Epithel, UA <1 0 - 1 /hpf    Hyaline Casts, UA >20 (A) None /lpf   Urine Culture    Collection Time: 08/28/21  6:30 PM    Specimen: Urine, Random   Result Value Ref Range    Culture Result Culture In Progress    Dextrose Stick Glucose  Collection Time: 08/29/21  2:58 AM   Result Value Ref Range    Glucose POCT 240 (H) 71 - 99 mg/dL   CBC and differential    Collection Time: 08/29/21  5:21 AM   Result Value Ref Range    WBC 7.8 4.0 - 11.0 K/cmm    RBC 4.47 4.00 - 5.70 M/cmm    Hemoglobin 13.8 13.0 - 17.5 gm/dL    Hematocrit 16.1 09.6 - 52.5 %    MCV 94 80 - 100 fL    MCH 31 28 - 35 pg    MCHC 33 32 - 36 gm/dL    RDW 04.5 40.9 - 81.1 %    PLT CT 235 130 - 440 K/cmm    MPV 8.6 6.0 - 10.0 fL    Neutrophils % 59.7 42.0 - 78.0 %    Lymphocytes 30.5 15.0 - 46.0 %    Monocytes 8.4 3.0 - 15.0 %    Eosinophils % 1.1 0.0 - 7.0 %    Basophils % 0.3 0.0 - 3.0 %    Neutrophils Absolute 4.7 1.7 - 8.6 K/cmm    Lymphocytes Absolute 2.4 0.6 - 5.1 K/cmm    Monocytes Absolute 0.7 0.1 - 1.7 K/cmm    Eosinophils Absolute 0.1 0.0 - 0.8 K/cmm    Basophils Absolute 0.0 0.0 - 0.3 K/cmm   Comprehensive metabolic panel    Collection Time: 08/29/21  5:21 AM   Result Value Ref Range    Sodium 136 136 - 147 mMol/L    Potassium 3.6 3.5 - 5.3 mMol/L    Chloride 106 98 - 110 mMol/L    CO2 21.0 20.0 - 30.0 mMol/L    Calcium 8.8 8.5 - 10.5 mg/dL    Glucose 914 (H) 71 - 99 mg/dL    Creatinine 7.82 9.56 - 1.30 mg/dL    BUN 24 (H) 7 - 22 mg/dL    Protein, Total 6.4 6.0 - 8.3 gm/dL    Albumin 3.5 3.5 - 5.0 gm/dL    Alkaline Phosphatase 64 40 - 145 U/L    ALT 15 0 - 55 U/L    AST (SGOT) 12 10 - 42 U/L    Bilirubin, Total 0.6 0.1 - 1.2 mg/dL    Albumin/Globulin Ratio 1.21 0.80 - 2.00 Ratio    Anion Gap 12.6 7.0 - 18.0 mMol/L    BUN / Creatinine Ratio 21.1 10.0 - 30.0 Ratio    EGFR 67 60 - 150 mL/min/1.24m2    Osmolality Calculated 279 275 - 300 mOsm/kg    Globulin 2.9 2.0 - 4.0 gm/dL   Magnesium    Collection Time: 08/29/21  5:21 AM   Result Value Ref Range    Magnesium 1.8 1.6 - 2.6 mg/dL   Phosphorus    Collection Time: 08/29/21  5:21 AM   Result Value Ref Range    Phosphorus 2.8 2.3 - 4.7 mg/dL   Hemoglobin O1H    Collection Time: 08/29/21  5:21 AM   Result Value Ref Range    Hgb A1C,  % 7.2 %    Estimated Average Glucose 160 mg/dL   Dextrose Stick Glucose    Collection Time: 08/29/21  8:11 AM   Result Value Ref Range    Glucose POCT 176 (H) 71 - 99 mg/dL          Allergies   Allergen Reactions    Fentanyl      "Stopped breathing"      US Renal Kidney Bladder Complete  Result Date: 08/28/2021  Normal exam. ReadingStation:[email or web address]      Home Medications       Med List Status: Complete Set By: Avel Sensor, RN at 08/29/2021  5:00 AM              aspirin 81 MG chewable tablet     Chew 81 mg by mouth daily.     atorvastatin (LIPITOR) 20 MG tablet     Take 20 mg by mouth daily     Cholecalciferol (VITAMIN D) 1000 UNIT tablet     Take 1,000 Units by mouth daily.     finasteride (PROPECIA) 1 MG tablet     Take 5 mg by mouth daily        lisinopril (ZESTRIL) 5 MG tablet     Take 5 mg by mouth     metFORMIN (GLUCOPHAGE) 500 MG tablet     Take 1 tablet (500 mg total) by mouth 2 (two) times daily with meals     metoprolol succinate XL (TOPROL-XL) 25 MG 24 hr tablet     Take 25 mg by mouth 2 (two) times daily        tamsulosin (FLOMAX) 0.4 MG Cap     Take 0.4 mg by mouth Daily after dinner     UNKNOWN TO PATIENT                Meds given in the ED:  Medications   sodium chloride (PF) 0.9 % injection 3 mL (3 mLs Intravenous Given 08/29/21 0847)   naloxone (NARCAN) injection 0.4 mg (has no administration in time range)   heparin (porcine) (Heparin Sodium (Porcine)) injection 5,000 Units (5,000 Units Subcutaneous Given 08/29/21 0310)   0.9% NaCl infusion ( Intravenous New Bag 08/29/21 0309)   cefTRIAXone (ROCEPHIN) 1 g in sodium chloride (PF) 0.9 % 10 mL Injection (has no administration in time range)   aspirin chewable tablet 81 mg (81 mg Oral Given 08/29/21 0845)   atorvastatin (LIPITOR) tablet 20 mg (20 mg Oral Given 08/29/21 0845)   metoprolol succinate XL (TOPROL-XL) 24 hr tablet 25 mg (25 mg Oral Given 08/29/21 0845)   tamsulosin (FLOMAX) capsule 0.4 mg (0.4 mg Oral Given 08/29/21 0336)   dextrose  (D10W) 10% bolus 125 mL (has no administration in time range)   glucagon (rDNA) (GLUCAGEN) injection 1 mg (has no administration in time range)   insulin lispro (1 Unit Dial) (HumaLOG) injection pen 1-9 Units (1 Units Subcutaneous Not Given 08/29/21 0840)     And   insulin lispro (1 Unit Dial) (HumaLOG) injection pen 1-7 Units (has no administration in time range)     And   insulin lispro (1 Unit Dial) (HumaLOG) injection pen 1-7 Units (1 Units Subcutaneous Not Given 08/29/21 0337)   vitamin D (cholecalciferol) tablet 25 mcg (25 mcg Oral Given 08/29/21 0845)   metFORMIN (GLUCOPHAGE) tablet 500 mg (500 mg Oral Given 08/29/21 0846)   finasteride (PROSCAR) tablet 5 mg (has no administration in time range)   sodium chloride 0.9 % bolus 1,000 mL (0 mLs Intravenous Stopped 08/29/21 0227)   cefTRIAXone (ROCEPHIN) 1 g in sodium chloride (PF) 0.9 % 10 mL Injection (1 g Intravenous Given 08/28/21 2354)      Time Spent:     Rickard Rhymes, MD     08/29/21,8:52 AM   MRN: [id number]  CSN: [id number] DOB: 1945/10/04

## 2021-08-30 LAB — COMPREHENSIVE METABOLIC PANEL
ALT: 14 U/L (ref 0–55)
AST (SGOT): 13 U/L (ref 10–42)
Albumin/Globulin Ratio: 1.57 Ratio (ref 0.80–2.00)
Albumin: 3.3 gm/dL — ABNORMAL LOW (ref 3.5–5.0)
Alkaline Phosphatase: 62 U/L (ref 40–145)
Anion Gap: 13.9 mMol/L (ref 7.0–18.0)
BUN / Creatinine Ratio: 15.9 Ratio (ref 10.0–30.0)
BUN: 17 mg/dL (ref 7–22)
Bilirubin, Total: 0.4 mg/dL (ref 0.1–1.2)
CO2: 21 mMol/L (ref 20–30)
Calcium: 8.5 mg/dL (ref 8.5–10.5)
Chloride: 111 mMol/L — ABNORMAL HIGH (ref 98–110)
Creatinine: 1.07 mg/dL (ref 0.80–1.30)
EGFR: 72 mL/min/{1.73_m2} (ref 60–150)
Globulin: 2.1 gm/dL (ref 2.0–4.0)
Glucose: 120 mg/dL — ABNORMAL HIGH (ref 71–99)
Osmolality Calculated: 284 mOsm/kg (ref 275–300)
Potassium: 4.9 mMol/L (ref 3.5–5.3)
Protein, Total: 5.4 gm/dL — ABNORMAL LOW (ref 6.0–8.3)
Sodium: 141 mMol/L (ref 136–147)

## 2021-08-30 LAB — CBC AND DIFFERENTIAL
Basophils %: 0.7 % (ref 0.0–3.0)
Basophils Absolute: 0 10*3/uL (ref 0.0–0.3)
Eosinophils %: 1.5 % (ref 0.0–7.0)
Eosinophils Absolute: 0.1 10*3/uL (ref 0.0–0.8)
Hematocrit: 39.3 % (ref 39.0–52.5)
Hemoglobin: 12.9 gm/dL — ABNORMAL LOW (ref 13.0–17.5)
Lymphocytes Absolute: 2.6 10*3/uL (ref 0.6–5.1)
Lymphocytes: 39.5 % (ref 15.0–46.0)
MCH: 31 pg (ref 28–35)
MCHC: 33 gm/dL (ref 32–36)
MCV: 95 fL (ref 80–100)
MPV: 8.6 fL (ref 6.0–10.0)
Monocytes Absolute: 0.6 10*3/uL (ref 0.1–1.7)
Monocytes: 8.5 % (ref 3.0–15.0)
Neutrophils %: 49.9 % (ref 42.0–78.0)
Neutrophils Absolute: 3.3 10*3/uL (ref 1.7–8.6)
PLT CT: 213 10*3/uL (ref 130–440)
RBC: 4.13 10*6/uL (ref 4.00–5.70)
RDW: 11.9 % (ref 11.0–14.0)
WBC: 6.6 10*3/uL (ref 4.0–11.0)

## 2021-08-30 LAB — VH DEXTROSE STICK GLUCOSE
Glucose POCT: 118 mg/dL — ABNORMAL HIGH (ref 71–99)
Glucose POCT: 136 mg/dL — ABNORMAL HIGH (ref 71–99)

## 2021-08-30 LAB — VH CULTURE, URINE

## 2021-08-30 LAB — MAGNESIUM: Magnesium: 1.5 mg/dL — ABNORMAL LOW (ref 1.6–2.6)

## 2021-08-30 LAB — PHOSPHORUS: Phosphorus: 3 mg/dL (ref 2.3–4.7)

## 2021-08-30 MED ORDER — VH MAGNESIUM SULFATE 2 G IN 50 ML IV PREMIX
2.0000 g | Freq: Once | INTRAVENOUS | Status: AC
Start: 2021-08-30 — End: 2021-08-30
  Administered 2021-08-30: 10:00:00 2 g via INTRAVENOUS
  Filled 2021-08-30: qty 50

## 2021-08-30 NOTE — Discharge Summary (Signed)
Medicine Discharge Summary   Duane Thomas  [facility]   Patient Name: Duane Thomas, Duane Thomas   Attending Physician: Theda Sers, MD PCP: Marisa Sprinkles, MD   Date of Admission: 08/28/2021 D/C Date: 08/30/2021   Discharge Diagnoses:       Acute kidney injury  Cystitis ruled out  BPH  Neurogenic bladder  Type 2 diabetes  Hypertension  Hypomagnesemia     Hospital Course       Duane Thomas is a 75 y.o. male patient that was admitted on 08/28/2021     Principal Problem:    Acute kidney injury  Resolved Problems:    * No resolved hospital problems. *     Past medical history of CAD status post CABG, hypertension, dyslipidemia, neurogenic bladder, BPH type 2 diabetes presented to the ED complaints of decreased urination.  Patient uses self intermittent catheterization for neurogenic bladder and he noticed 1 day PTA that his urine output is decreasing and  his urine is dark.  He thought that he had a kidney failure and the dehydration so he decided to come to the ED.  He denies any nausea, vomiting, diarrhea, fever, chills has been eating his normal diet Etiology  afebrile, labs remarkable for elevated creatinine 1.6,  BUN 29.  UA positive for infection, started on Rocephin.  Patient was given with IV fluid.  Ultrasound did not show any blockage.  Renal function improved with the IV fluid.  Repeated UA clean does not think patient has UTI.  Did have hypomagnesemia given magnesium 2 g IV. patient is discharged in stable condition.    Patient does not have a primary care or urology in this local area.  Patient wished to establish care.  Patient is referred to the transition clinic.  Per the patient, he will be traveling to [place] soon,   staying there for unknown time.     Pending Results and other significant studies:  None     Discharge Instructions:          Disposition: Home  Diet: Cardiac Diet, diabetic  Activity: As tolerated  Discharge Code Status: Full Code    [facility]  [address]  [place] [address]  [phone]  Follow up in 1 week(s)         Discharge Medications:                                                                        Discharge Medication List        Taking      aspirin 81 MG chewable tablet  Dose: 81 mg  Chew 81 mg by mouth daily.     atorvastatin 20 MG tablet  Dose: 20 mg  Commonly known as: LIPITOR  Take 20 mg by mouth daily     finasteride 1 MG tablet  Dose: 5 mg  Commonly known as: PROPECIA  Take 5 mg by mouth daily      lisinopril 5 MG tablet  Dose: 5 mg  Commonly known as: ZESTRIL  Take 5 mg by mouth     metFORMIN 500 MG tablet  Dose: 500 mg  Commonly known as: GLUCOPHAGE  Take 1 tablet (  500 mg total) by mouth 2 (two) times daily with meals     metoprolol succinate XL 25 MG 24 hr tablet  Dose: 25 mg  Commonly known as: TOPROL-XL  Take 25 mg by mouth 2 (two) times daily      tamsulosin 0.4 MG Caps  Dose: 0.4 mg  Commonly known as: FLOMAX  Take 0.4 mg by mouth Daily after dinner     UNKNOWN TO PATIENT     vitamin D 1000 UNIT tablet  Dose: 1,000 Units  Commonly known as: cholecalciferol  Take 1,000 Units by mouth daily.               Discharge Day Exam (08/30/2021):     Blood pressure 114/62, pulse (!) 54, temperature (!) 96.6 F (35.9 C), temperature source Temporal, resp. rate 16, height 1.727 m (5' 7.99"), weight 89.8 kg (197 lb 15.6 oz), SpO2 94 %.             General: Patient is awake. In no acute distress.  Chest: CTA bilaterally. No rhonchi, no wheezing. No use of accessory muscles.  CVS: Normal rate and regular rhythm no murmurs, without JVD.  Abdomen: Soft, non-tender, no guarding or rigidity, with normal bowel sounds.  Extremities: No pitting edema, pulses palpable, no calf swelling and no gross deformity.  Skin: Warm, dry  NEURO: No motor or sensory deficits.     Recent Labs      Recent Labs   Lab 08/30/21  0430 08/29/21  0521 08/28/21  1830   WBC 6.6 7.8 8.0   RBC 4.13 4.47 5.45   Hemoglobin 12.9* 13.8 16.4    Hematocrit 39.3 42.1 51.6   MCV 95 94 95   PLT CT 213 235 278     Recent Labs   Lab 08/28/21  1830   PT 10.3   PT INR 1.0         Lab Results   Component Value Date    HGBA1CPERCNT 7.2 08/29/2021     Recent Labs   Lab 08/30/21  0430 08/29/21  0521 08/28/21  1830   Glucose 120* 155* 167*   Sodium 141 136 139   Potassium 4.9 3.6 4.1   Chloride 111* 106 101   CO2 21 21.0 22   BUN 17 24* 29*   Creatinine 1.07 1.14 1.67*   EGFR 72 67 42*   Calcium 8.5 8.8 10.3     Recent Labs   Lab 08/30/21  0430 08/29/21  0521 08/28/21  1830   Magnesium 1.5* 1.8  --    Phosphorus 3.0 2.8  --    Albumin 3.3* 3.5 4.5   Protein, Total 5.4* 6.4 8.1   Bilirubin, Total 0.4 0.6 0.9   Alkaline Phosphatase 62 64 83   ALT 14 15 21    AST (SGOT) 13 12 14         Allergies:      Fentanyl   Time spent on discharging the patient:  35 minutes   US Renal Kidney Bladder Complete    Result Date: 08/28/2021  Normal exam. ReadingStation:[email or web address]    Home Health Needs:  There are no questions and answers to display.      Theda Sers, MD         08/30/21 9:27 AM   MRN: [id number]  CSN: [id number] DOB: [birth year]

## 2021-08-30 NOTE — Plan of Care (Addendum)
NURSE NOTE SUMMARY  [facility] [address]   Patient Name: Duane Thomas   Attending Physician: Theda Sers, MD   Today's date:   08/30/2021 LOS: 0 days   Shift Summary:                                                              0730: Assumed care with assessment completed. Assisted patient with ordering breakfast. Pt will notify staff when he self caths. Encouraged continued activity with bed exercises including foot pumps. Denies needs. Call bell in reach.   0815: Dr. Dorna Bloom notified of mag level of 1.5. Orders received.   1520: Discharge paperwork given by Melonie Florida RN. INT removed. Signed copy on front of chart. Taken out to [employer] by [employer].      Provider Notifications:        Rapid Response Notifications:  Mobility:      PMP Activity: Step 6 - Walks in Room (08/30/2021 11:24 AM)     Weight tracking:  Family Dynamic:   Last 3 Weights for the past 72 hrs (Last 3 readings):   Weight   08/29/21 0406 89.8 kg (197 lb 15.6 oz)   08/28/21 1702 90.7 kg (199 lb 15.3 oz)             Last Bowel Movement   Last BM Date: 08/28/21        Problem: Fluid and Electrolyte Imbalance/ Endocrine  Goal: Fluid and electrolyte balance are achieved/maintained  Flowsheets (Taken 08/29/2021 2053 by Janelle Floor, RN)  Fluid and electrolyte balance are achieved/maintained:   Monitor intake and output every shift   Monitor/assess lab values and report abnormal values   Provide adequate hydration   Assess and reassess fluid and electrolyte status  Goal: Adequate hydration  Outcome: Progressing  Flowsheets (Taken 08/29/2021 2053 by Janelle Floor, RN)  Adequate hydration:   Assess for peripheral, sacral, periorbital and abdominal edema   Monitor and assess vital signs and perfusion

## 2021-08-30 NOTE — Discharge Instr - AVS First Page (Addendum)
Take your medications as prescribed.   Follow all instructions as given by the Physician.   If you do not keep follow up in [place], be sure to schedule follow up with your Primary Care Provider in [place].   Above all, feel free to call with any and all concerns or with any symptoms not normal for you.

## 2021-08-30 NOTE — UM Notes (Addendum)
DOS 08/30/2021  OBS    AUTH#:      Duane Thomas  02-Jul-1946  MRN: [id number]      MD NOTES:  12/29  #Acute kidney injury likely prerenal  -Creatinine 1.67, BUN 29  -IV hydration  -Hold lisinopril  -Avoid nephrotoxic agents  -Continue to monitor kidney function     #Acute cystitis  -Afebrile hemodynamically stable  -No leukocytosis  -Urinalysis positive for WBC, leukocyte esterase  -Culture pending  -Started on ceftriaxone     #BPH  #Neurogenic bladder  -Patient uses self intermittent catheterization  -Continue tamsulosin finasteride     #Type 2 diabetes:  -Hold home metformin  -Insulin sliding scale Accu-Cheks     #Hypertension  Continue metoprolol, hold lisinopril due to AKI    Tx  IV ABX    VS Q 8 HR  POCT/SSI       Recent Labs   Lab 08/30/21  0430   WBC 6.6   Hemoglobin 12.9*   Hematocrit 39.3   PLT CT 213          Recent Labs   Lab 08/30/21  0430   Sodium 141   Potassium 4.9   Chloride 111*   CO2 21   BUN 17   Creatinine 1.07   EGFR 72   Glucose 120*   Calcium 8.5          VITALS: Blood pressure 114/62, pulse (!) 54, temperature (!) 96.6 F (35.9 C), temperature source Temporal, resp. rate 16, height 1.727 m (5' 7.99"), weight 89.8 kg (197 lb 15.6 oz), SpO2 94 %.      Scheduled Meds:  Current Facility-Administered Medications   Medication Dose Route Frequency    aspirin  81 mg Oral Daily    atorvastatin  20 mg Oral Daily    cefTRIAXone  1 g Intravenous Q24H    finasteride  5 mg Oral QHS    heparin (porcine)  5,000 Units Subcutaneous Q12H SCH    insulin lispro (1 Unit Dial)  1-9 Units Subcutaneous TID AC    And    insulin lispro (1 Unit Dial)  1-7 Units Subcutaneous QHS    metFORMIN  500 mg Oral BID Meals    metoprolol succinate XL  25 mg Oral BID    sodium chloride (PF)  3 mL Intravenous Q12H SCH    tamsulosin  0.4 mg Oral Daily after dinner    vitamin D  25 mcg Oral Daily     Continuous Infusions:  PRN Meds:.dextrose, glucagon (rDNA), NSG Communication: Glucose POCT order (AC, HS) **AND** insulin lispro (1  Unit Dial) **AND** insulin lispro (1 Unit Dial) **AND** insulin lispro (1 Unit Dial), naloxone            Duane Thomas BSN, RN  Utilization Management Review Nurse  [facility] [address]  [address]  [place], [place] [address]  [phone] office Phone, Direct and Confidential  [phone], Fax  [email or web address]

## 2021-08-31 LAB — VH CULTURE, URINE: Culture Result: NO GROWTH

## 2021-09-05 ENCOUNTER — Ambulatory Visit (INDEPENDENT_AMBULATORY_CARE_PROVIDER_SITE_OTHER): Payer: No Typology Code available for payment source | Admitting: Nurse Practitioner

## 2021-09-10 ENCOUNTER — Encounter: Payer: Self-pay | Admitting: Emergency Medicine

## 2021-09-10 ENCOUNTER — Other Ambulatory Visit: Payer: Self-pay

## 2021-09-10 ENCOUNTER — Emergency Department: Payer: No Typology Code available for payment source

## 2021-09-10 DIAGNOSIS — Z955 Presence of coronary angioplasty implant and graft: Secondary | ICD-10-CM | POA: Insufficient documentation

## 2021-09-10 DIAGNOSIS — I10 Essential (primary) hypertension: Secondary | ICD-10-CM | POA: Insufficient documentation

## 2021-09-10 DIAGNOSIS — J029 Acute pharyngitis, unspecified: Secondary | ICD-10-CM | POA: Diagnosis present

## 2021-09-10 DIAGNOSIS — U071 COVID-19: Secondary | ICD-10-CM | POA: Diagnosis not present

## 2021-09-10 DIAGNOSIS — I2510 Atherosclerotic heart disease of native coronary artery without angina pectoris: Secondary | ICD-10-CM | POA: Insufficient documentation

## 2021-09-10 DIAGNOSIS — E119 Type 2 diabetes mellitus without complications: Secondary | ICD-10-CM | POA: Insufficient documentation

## 2021-09-10 LAB — COMPREHENSIVE METABOLIC PANEL
ALT: 29 U/L (ref 0–44)
AST: 28 U/L (ref 15–41)
Albumin: 4.3 g/dL (ref 3.5–5.0)
Alkaline Phosphatase: 58 U/L (ref 38–126)
Anion gap: 7 (ref 5–15)
BUN: 15 mg/dL (ref 8–23)
CO2: 25 mmol/L (ref 22–32)
Calcium: 9 mg/dL (ref 8.9–10.3)
Chloride: 102 mmol/L (ref 98–111)
Creatinine, Ser: 1.21 mg/dL (ref 0.61–1.24)
GFR, Estimated: >60 mL/min (ref >60)
Glucose, Bld: 169 mg/dL — ABNORMAL HIGH (ref 70–99)
Potassium: 4.3 mmol/L (ref 3.5–5.1)
Sodium: 134 mmol/L — ABNORMAL LOW (ref 135–145)
Total Bilirubin: 0.6 mg/dL (ref 0.3–1.2)
Total Protein: 7 g/dL (ref 6.5–8.1)

## 2021-09-10 LAB — CBC WITH DIFFERENTIAL/PLATELET
Abs Immature Granulocytes: 0.01 10*3/uL (ref 0.00–0.07)
Basophils Absolute: 0 10*3/uL (ref 0.0–0.1)
Basophils Relative: 0 %
Eosinophils Absolute: 0 10*3/uL (ref 0.0–0.5)
Eosinophils Relative: 0 %
HCT: 44.4 % (ref 39.0–52.0)
Hemoglobin: 14.6 g/dL (ref 13.0–17.0)
Immature Granulocytes: 0 %
Lymphocytes Relative: 15 %
Lymphs Abs: 0.6 10*3/uL — ABNORMAL LOW (ref 0.7–4.0)
MCH: 29.8 pg (ref 26.0–34.0)
MCHC: 32.9 g/dL (ref 30.0–36.0)
MCV: 90.6 fL (ref 80.0–100.0)
Monocytes Absolute: 0.5 10*3/uL (ref 0.1–1.0)
Monocytes Relative: 12 %
Neutro Abs: 2.9 10*3/uL (ref 1.7–7.7)
Neutrophils Relative %: 73 %
Platelets: 234 10*3/uL (ref 150–400)
RBC: 4.9 MIL/uL (ref 4.22–5.81)
RDW: 12.6 % (ref 11.5–15.5)
WBC: 4 10*3/uL (ref 4.0–10.5)
nRBC: 0 % (ref 0.0–0.2)

## 2021-09-10 LAB — RESP PANEL BY RT-PCR (FLU A&B, COVID) ARPGX2
Influenza A by PCR: NEGATIVE
Influenza B by PCR: NEGATIVE
SARS Coronavirus 2 by RT PCR: POSITIVE — AB

## 2021-09-10 NOTE — ED Triage Notes (Signed)
Pt to ED from home c/o body aches and sore throat earlier today.  Denies n/v/d or other pain.  States was treated for UTI 3 weeks ago but denies urinary issues now.  Pt A&Ox4, chest rise even and unlabored, skin WNL and in NAD at this time.

## 2021-09-11 ENCOUNTER — Emergency Department
Admission: EM | Admit: 2021-09-11 | Discharge: 2021-09-11 | Disposition: A | Discharge status: Home / Self Care | Payer: No Typology Code available for payment source | Attending: Emergency Medicine | Admitting: Emergency Medicine

## 2021-09-11 DIAGNOSIS — U071 COVID-19: Secondary | ICD-10-CM

## 2021-09-11 MED ORDER — ACETAMINOPHEN 500 MG PO TABS
1000.0000 mg | ORAL_TABLET | Freq: Once | ORAL | Status: AC
  Administered 2021-09-11: 1000 mg via ORAL
  Filled 2021-09-11: qty 2

## 2021-09-11 MED ORDER — NIRMATRELVIR/RITONAVIR (PAXLOVID)TABLET
3.0000 | ORAL_TABLET | Freq: Once | ORAL | Status: DC
  Filled 2021-09-11: qty 30

## 2021-09-11 MED ORDER — NIRMATRELVIR/RITONAVIR (PAXLOVID) TABLET (RENAL DOSING)
2.0000 | ORAL_TABLET | Freq: Two times a day (BID) | ORAL | Status: DC
  Administered 2021-09-11: 2 via ORAL
  Filled 2021-09-11: qty 20

## 2021-09-11 NOTE — ED Provider Notes (Signed)
[facility] Provider Note    Event Date/Time   First MD Initiated Contact with Patient 09/11/21 0014     (approximate)   History   Generalized Body Aches and Sore Throat   HPI  Kyle Richards is a 76 y.o. male with a history of diabetes, hypertension, hyperlipidemia, obesity, CAD status post CABG who presents for evaluation of body aches and a sore throat.  Symptoms started earlier today.  Patient reports taking some chicken noodle soup at home and his symptoms are markedly improved.  Still complaining of some body aches.  No chest pain, no shortness of breath, no cough, no congestion, no known fever, no abdominal pain, no vomiting or diarrhea.  His symptoms are very minimal     Past Medical History:  Diagnosis Date   Diabetes ([place])    Heart attack ([place]) 2012   High cholesterol    Hypertension    Neuropathy     Past Surgical History:  Procedure Laterality Date   FINGER FRACTURE SURGERY Left    middle finger left hand   TONSILLECTOMY AND ADENOIDECTOMY  1953     Physical Exam   Triage Vital Signs: ED Triage Vitals  Enc Vitals Group     BP 09/10/21 2136 (!) 143/80     Pulse Rate 09/10/21 2136 86     Resp 09/10/21 2136 16     Temp 09/10/21 2136 98.4 F (36.9 C)     Temp Source 09/10/21 2136 Oral     SpO2 09/10/21 2136 93 %     Weight 09/10/21 2135 192 lb (87.1 kg)     Height 09/10/21 2135 5\' 8"  (1.727 m)     Head Circumference --      Peak Flow --      Pain Score 09/10/21 2135 4     Pain Loc --      Pain Edu? --      Excl. in [place]? --     Most recent vital signs: Vitals:   09/10/21 2136 09/11/21 0119  BP: (!) 143/80 (!) 142/76  Pulse: 86 72  Resp: 16 17  Temp: 98.4 F (36.9 C)   SpO2: 93% 94%     Constitutional: Alert and oriented. Well appearing and in no apparent distress. HEENT:      Head: Normocephalic and atraumatic.         Eyes: Conjunctivae are normal. Sclera is non-icteric.       Mouth/Throat: Mucous membranes  are moist.       Neck: Supple with no signs of meningismus. Cardiovascular: Regular rate and rhythm. No murmurs, gallops, or rubs. 2+ symmetrical distal pulses are present in all extremities.  Respiratory: Normal respiratory effort. Lungs are clear to auscultation bilaterally.  Gastrointestinal: Soft, non tender, and non distended with positive bowel sounds. No rebound or guarding. Genitourinary: No CVA tenderness. Musculoskeletal:  No edema, cyanosis, or erythema of extremities. Neurologic: Normal speech and language. Face is symmetric. Moving all extremities. No gross focal neurologic deficits are appreciated. Skin: Skin is warm, dry and intact. No rash noted. Psychiatric: Mood and affect are normal. Speech and behavior are normal.  ED Results / Procedures / Treatments   Labs (all labs ordered are listed, but only abnormal results are displayed) Labs Reviewed  RESP PANEL BY RT-PCR (FLU A&B, COVID) ARPGX2 - Abnormal; Notable for the following components:      Result Value   SARS Coronavirus 2 by RT PCR POSITIVE (*)    All  other components within normal limits  CBC WITH DIFFERENTIAL/PLATELET - Abnormal; Notable for the following components:   Lymphs Abs 0.6 (*)    All other components within normal limits  COMPREHENSIVE METABOLIC PANEL - Abnormal; Notable for the following components:   Sodium 134 (*)    Glucose, Bld 169 (*)    All other components within normal limits  URINALYSIS, COMPLETE (UACMP) WITH MICROSCOPIC     EKG  none   RADIOLOGY I, Rudene Re, attending MD, have personally viewed and interpreted the images obtained during this visit as below:  Chest x-ray with no signs of pneumonia   ___________________________________________________ Interpretation by Radiologist:  DG Chest 2 View  Result Date: 09/10/2021 CLINICAL DATA:  covid, weakness EXAM: CHEST - 2 VIEW COMPARISON:  Chest x-ray 01/22/2021 FINDINGS: The heart and mediastinal contours are unchanged.  Aortic calcification. Cardiac surgical changes overlie the mediastinum. Right apex poorly visualized due to overlying mandible. Elevated right hemidiaphragm with right base atelectasis. No focal consolidation. No pulmonary edema. Nonspecific blunting of the right costophrenic angle. No pleural effusion. No pneumothorax. No acute osseous abnormality. IMPRESSION: 1. Persistent elevated right hemidiaphragm with no active cardiopulmonary disease. 2.  Aortic Atherosclerosis (ICD10-I70.0). Electronically Signed   By: Iven Finn M.D.   On: 09/10/2021 23:29      PROCEDURES:  Critical Care performed: No  Procedures    IMPRESSION / MDM / ASSESSMENT AND PLAN / ED COURSE  I reviewed the triage vital signs and the nursing notes.   76 y.o. male with a history of diabetes, hypertension, hyperlipidemia, obesity, CAD status post CABG who presents for evaluation of body aches and a sore throat.  Patient is extremely well-appearing in no distress with normal vital signs, normal work of breathing and normal sats both at rest and with ambulation, lungs are clear to auscultation  Ddx: COVID versus flu versus pneumonia versus viral syndrome versus strep   Plan: COVID and flu swab, chest x-ray, CBC, chemistry panel.  Patient placed on telemetry for monitoring of cardiorespiratory status   MEDICATIONS GIVEN IN ED: Medications  nirmatrelvir/ritonavir EUA (renal dosing) (PAXLOVID) 2 tablet (has no administration in time range)  acetaminophen (TYLENOL) tablet 1,000 mg (1,000 mg Oral Given 09/11/21 0118)     ED COURSE: Patient is COVID-positive.  Chest x-ray with no signs of pneumonia.  Labs showing mild hyperglycemia with no evidence of DKA, no AKI, no signs of sepsis, no anemia.  Patient monitored on telemetry with no signs of ischemia or dysrhythmias.  Remains with minimal symptoms of mild body aches.  Discussed risks and benefits of Paxlovid and patient would like to start treatment.  Will recommend holding  atorvastatin until the end of his treatment.  Recommended follow-up with primary care doctor in 2 days.  Discussed my standard return precautions, immune system boosting, oxygen monitoring, and quarantine.  Hospitalization was considered however with no hypoxia both at rest and ambulation there is no indication for admission.   Consults: none   EMR reviewed including patient's last visit with his cardiologist on July 2022 for CAD, and hypertension       FINAL CLINICAL IMPRESSION(S) / ED DIAGNOSES   Final diagnoses:  COVID-19     Rx / DC Orders   ED Discharge Orders     None        Note:  This document was prepared using Dragon voice recognition software and may include unintentional dictation errors.    Alfred Levins, [place], MD 09/11/21 [phone]

## 2021-09-11 NOTE — Discharge Instructions (Addendum)
Take Paxlovid as prescribed. While taking Paxlovid, stop taking your atorvastatin. Resume atorvastatin 24 hours after the last dose of Paxlovid   To help boost your immune system against COVID-19, please take:  - Vitamin D3 4,000 IU/day - Vitamin C 500-1,000?mg twice a day - Quercetin 250?mg twice a day - Zinc 100?mg/day - Melatonin 10?mg before bedtime (causes drowsiness) - Aspirin 325?mg/day (unless contraindicated) - Pulse Oximeter Monitoring of oxygen saturation is recommended - check your oxygen 3 times a day if less than 90% return to the ER  These medications are all over-the-counter and do not need a prescription.  QUARANTINE INSTRUCTION  Follow these instructions at home:  Protecting others To avoid spreading the illness to other people: Quarantine in your home for 5 days Household members: With no symptoms: who are vaccinated do not need to quarantine but are strongly encouraged to use masks to prevent infection.  With symptoms: should get tested and if positive should quarantine for 5 days. Wash your hands often with soap and water. If soap and water are not available, use an alcohol-based hand sanitizer. If you have not cleaned your hands, do not touch your face. Make sure that all people in your household wash their hands well and often. Cover your nose and mouth when you cough or sneeze. Throw away used tissues. Stay home if you have any cold-like or flu-like symptoms. General instructions Go to your local pharmacy and buy a pulse oximeter (this is a machine that measures your oxygen). Check your oxygen levels at least 3 times a day. If your oxygen level is 90% or less return to the emergency room immediately Take over-the-counter and prescription medicines only as told by your health care provider. If you need medication for fever take tylenol or ibuprofen Drink enough fluid to keep your urine pale yellow. Rest at home as directed by your health care provider. Do  not give aspirin to a child with the flu, because of the association with Reye's syndrome. Do not use tobacco products, including cigarettes, chewing tobacco, and e-cigarettes. If you need help quitting, ask your health care provider. Keep all follow-up visits as told by your health care provider. This is important. How is this prevented? Avoid areas where an outbreak has been reported. Avoid large groups of people. Keep a safe distance from people who are coughing and sneezing. Do not touch your face if you have not cleaned your hands. When you are around people who are sick or might be sick, wear a mask to protect yourself. Contact a health care provider if: You have symptoms of SARS (cough, fever, chest pain, shortness of breath) that are not getting better at home. You have a fever. If you have difficulty breathing go to your local ER or call 911

## 2021-09-16 ENCOUNTER — Encounter (HOSPITAL_COMMUNITY): Payer: Self-pay | Admitting: *Deleted

## 2021-09-16 ENCOUNTER — Other Ambulatory Visit: Payer: Self-pay

## 2021-09-16 ENCOUNTER — Emergency Department (HOSPITAL_COMMUNITY): Payer: No Typology Code available for payment source

## 2021-09-16 ENCOUNTER — Emergency Department (HOSPITAL_COMMUNITY)
Admission: EM | Admit: 2021-09-16 | Discharge: 2021-09-16 | Disposition: A | Discharge status: Home / Self Care | Payer: No Typology Code available for payment source | Attending: Emergency Medicine | Admitting: Emergency Medicine

## 2021-09-16 DIAGNOSIS — Z8616 Personal history of COVID-19: Secondary | ICD-10-CM | POA: Diagnosis not present

## 2021-09-16 DIAGNOSIS — Z7982 Long term (current) use of aspirin: Secondary | ICD-10-CM | POA: Diagnosis not present

## 2021-09-16 DIAGNOSIS — R42 Dizziness and giddiness: Secondary | ICD-10-CM | POA: Diagnosis present

## 2021-09-16 DIAGNOSIS — R5381 Other malaise: Secondary | ICD-10-CM | POA: Diagnosis not present

## 2021-09-16 LAB — BASIC METABOLIC PANEL
Anion gap: 9 (ref 5–15)
BUN: 18 mg/dL (ref 8–23)
CO2: 22 mmol/L (ref 22–32)
Calcium: 8.8 mg/dL — ABNORMAL LOW (ref 8.9–10.3)
Chloride: 102 mmol/L (ref 98–111)
Creatinine, Ser: 1.32 mg/dL — ABNORMAL HIGH (ref 0.61–1.24)
GFR, Estimated: 56 mL/min — ABNORMAL LOW (ref >60)
Glucose, Bld: 152 mg/dL — ABNORMAL HIGH (ref 70–99)
Potassium: 4.2 mmol/L (ref 3.5–5.1)
Sodium: 133 mmol/L — ABNORMAL LOW (ref 135–145)

## 2021-09-16 LAB — CBC WITH DIFFERENTIAL/PLATELET
Abs Immature Granulocytes: 0.01 10*3/uL (ref 0.00–0.07)
Basophils Absolute: 0 10*3/uL (ref 0.0–0.1)
Basophils Relative: 0 %
Eosinophils Absolute: 0 10*3/uL (ref 0.0–0.5)
Eosinophils Relative: 0 %
HCT: 44.6 % (ref 39.0–52.0)
Hemoglobin: 15.2 g/dL (ref 13.0–17.0)
Immature Granulocytes: 0 %
Lymphocytes Relative: 35 %
Lymphs Abs: 1.5 10*3/uL (ref 0.7–4.0)
MCH: 30.3 pg (ref 26.0–34.0)
MCHC: 34.1 g/dL (ref 30.0–36.0)
MCV: 88.8 fL (ref 80.0–100.0)
Monocytes Absolute: 0.3 10*3/uL (ref 0.1–1.0)
Monocytes Relative: 8 %
Neutro Abs: 2.4 10*3/uL (ref 1.7–7.7)
Neutrophils Relative %: 57 %
Platelets: 230 10*3/uL (ref 150–400)
RBC: 5.02 MIL/uL (ref 4.22–5.81)
RDW: 12.5 % (ref 11.5–15.5)
WBC: 4.3 10*3/uL (ref 4.0–10.5)
nRBC: 0 % (ref 0.0–0.2)

## 2021-09-16 LAB — URINALYSIS, ROUTINE W REFLEX MICROSCOPIC
Bilirubin Urine: NEGATIVE
Glucose, UA: NEGATIVE mg/dL
Hgb urine dipstick: NEGATIVE
Ketones, ur: NEGATIVE mg/dL
Leukocytes,Ua: NEGATIVE
Nitrite: NEGATIVE
Protein, ur: NEGATIVE mg/dL
Specific Gravity, Urine: >1.03 — ABNORMAL HIGH (ref 1.005–1.030)
pH: 5 (ref 5.0–8.0)

## 2021-09-16 NOTE — ED Provider Triage Note (Signed)
Emergency Medicine Provider Triage Evaluation Note  Kyle Richards , a 76 y.o. male  was evaluated in triage.  Pt complains of weakness.  Was diagnosed covid + 5 days at [facility] and has finished course of paxlovid.  States he continues to feel weak and lightheaded with standing.  Self caths and reports constant urge to urinate.  Denies fever/chills/sweats.  He is eating/drinking but thinks he may be dehydrated.  Review of Systems  Positive: Frequency, generalized weakness Negative: fever  Physical Exam  BP (!) 145/74 (BP Location: Left Arm)    Pulse 77    Temp (!) 97.5 F (36.4 C) (Oral)    Resp 20    SpO2 97%   Gen:   Awake, no distress   Resp:  Normal effort, dry cough MSK:   Moves extremities without difficulty  Other:    Medical Decision Making  Medically screening exam initiated at 12:21 AM.  Appropriate orders placed.  [name] was informed that the remainder of the evaluation will be completed by another provider, this initial triage assessment does not replace that evaluation, and the importance of remaining in the ED until their evaluation is complete.  Generalized weakness.  Recent covid + 09/11/21.  Also self caths and reports frequency.  Currently afebrile, non-toxic in appearance.  Will check labs, UA, repeat CXR.   Larene Pickett, PA-C 09/16/21 0025

## 2021-09-16 NOTE — ED Provider Notes (Signed)
[facility] EMERGENCY DEPARTMENT Provider Note   CSN: [id number] Arrival date & time: 09/16/21  0002     History  Chief Complaint  Patient presents with   Cough    Kyle Richards is a 76 y.o. male. Patient evaluated by me at 9:30 AM after a prolonged wait in the waiting room to be seen. HPI Patient here for evaluation of ongoing illness, COVID infection, currently being treated with Paxlovid.  He states he laid down to sleep last night and then became nervous after he started through the bathroom and felt dizzy.  He decided come here by private vehicle for evaluation.  He is worried that he is dehydrated or might have renal failure.  He completed his course of Paxlovid.  He states that this medicine helped him feel better.  He has not had anything to eat or drink or take any medication since his arrival.    Home Medications Prior to Admission medications   Medication Sig Start Date End Date Taking? Authorizing Provider  aspirin 81 MG chewable tablet Chew 81 mg by mouth daily.   Yes [provider]  atorvastatin (LIPITOR) 20 MG tablet Take 20 mg by mouth daily.   Yes [provider]  finasteride (PROSCAR) 5 MG tablet Take 5 mg by mouth daily. 06/03/21  Yes [provider]  lisinopril (PRINIVIL,ZESTRIL) 5 MG tablet Take 5 mg by mouth daily.   Yes [provider]  metFORMIN (GLUCOPHAGE) 500 MG tablet Take 500 mg by mouth 2 (two) times daily. 03/20/20  Yes [provider]  metoprolol succinate (TOPROL XL) 25 MG 24 hr tablet Take 1 tablet (25 mg total) by mouth daily. 03/22/21  Yes Freada Bergeron, MD  tamsulosin (FLOMAX) 0.4 MG CAPS capsule Take 1 capsule (0.4 mg total) by mouth daily. 06/14/18  Yes McGowan, Shannon A, PA-C  VITAMIN D PO Take 1 tablet by mouth daily.   Yes [provider]      Allergies    Fentanyl    Review of Systems   Review of Systems  All other systems reviewed and are  negative.  Physical Exam Updated Vital Signs BP 123/76    Pulse 90    Temp (!) 97.5 F (36.4 C) (Oral)    Resp 19    Ht 5\' 8"  (1.727 m)    Wt 87.1 kg    SpO2 98%    BMI 29.20 kg/m  Physical Exam Vitals and nursing note reviewed.  Constitutional:      Appearance: He is well-developed. He is not ill-appearing.  HENT:     Head: Normocephalic and atraumatic.     Right Ear: External ear normal.     Left Ear: External ear normal.  Eyes:     Conjunctiva/sclera: Conjunctivae normal.     Pupils: Pupils are equal, round, and reactive to light.  Neck:     Trachea: Phonation normal.  Cardiovascular:     Rate and Rhythm: Normal rate.  Pulmonary:     Effort: Pulmonary effort is normal.  Abdominal:     General: There is no distension.     Tenderness: There is no abdominal tenderness.  Musculoskeletal:        General: Normal range of motion.     Cervical back: Normal range of motion and neck supple.  Skin:    General: Skin is warm and dry.  Neurological:     Mental Status: He is alert and oriented to person, place, and time.  Cranial Nerves: No cranial nerve deficit.     Sensory: No sensory deficit.     Motor: No abnormal muscle tone.     Coordination: Coordination normal.     Comments: No dysarthria or aphasia.  Normal coordination.  Psychiatric:        Behavior: Behavior normal.        Thought Content: Thought content normal.        Judgment: Judgment normal.    ED Results / Procedures / Treatments   Labs (all labs ordered are listed, but only abnormal results are displayed) Labs Reviewed  BASIC METABOLIC PANEL - Abnormal; Notable for the following components:      Result Value   Sodium 133 (*)    Glucose, Bld 152 (*)    Creatinine, Ser 1.32 (*)    Calcium 8.8 (*)    GFR, Estimated 56 (*)    All other components within normal limits  URINALYSIS, ROUTINE W REFLEX MICROSCOPIC - Abnormal; Notable for the following components:   Specific Gravity, Urine >1.030 (*)    All  other components within normal limits  URINE CULTURE  CBC WITH DIFFERENTIAL/PLATELET    EKG None  Radiology DG Chest 2 View  Result Date: 09/16/2021 CLINICAL DATA:  Recent COVID with cough. EXAM: CHEST - 2 VIEW COMPARISON:  September 10, 2021 FINDINGS: Multiple sternal wires and vascular clips are seen. There is stable elevation of the right hemidiaphragm with a trace amount of suspected right basilar atelectasis. There is no evidence of a pleural effusion or pneumothorax. The heart size and mediastinal contours are within normal limits. Multilevel degenerative changes seen throughout the thoracic spine. IMPRESSION: Stable chronic and postoperative changes without acute or active cardiopulmonary disease. Electronically Signed   By: Virgina Norfolk M.D.   On: 09/16/2021 00:57    Procedures Procedures    Medications Ordered in ED Medications - No data to display  ED Course/ Medical Decision Making/ A&P                           Medical Decision Making Patient presenting with malaise after being treated with Paxlovid her COVID infection.  He complains of dizziness on arrival.  Vital signs have been reassuring during his entire stay.  Problems Addressed: Malaise: acute illness or injury  Amount and/or Complexity of Data Reviewed Labs: ordered.    Details: Minimal abnormalities including low sodium, high glucose, high creatinine, low calcium Radiology: ordered.    Details: no acute changes Discussion of management or test interpretation with external provider(s): Patient presenting with dizziness post treatment with Paxlovid for COVID infection.'s are reassuring.  Minimal elevation of creatinine from baseline however this could be the effect of COVID treatment with Paxlovid.  Orthostatic blood pressure and pulses are not strictly positive.  Patient remained comfortable in the emergency department.  Patient is nontoxic in appearance does not require further ED evaluation or  hospitalization.  No indication to change his chronic treatment.  He is low risk for discharge to home.  Risk Decision regarding hospitalization. Risk Details: Hospitalization considered but not indicated by findings during the course of the ED evaluation.           Final Clinical Impression(s) / ED Diagnoses Final diagnoses:  Malaise    Rx / DC Orders ED Discharge Orders     None         Daleen Bo, MD 09/16/21 2033

## 2021-09-16 NOTE — Discharge Instructions (Signed)
The testing does not show any severe dehydration.  To help improve your discomfort, try to drink 2 L of water every day.  It is common to feel like this after having COVID and being treated with Paxlovid.  Your symptoms hopefully will improve over the next few weeks.  Continue taking your usual prescribed medications.  Follow-up with your primary care doctor as needed for further problems.

## 2021-09-16 NOTE — ED Triage Notes (Signed)
The pt tested pos for covid this past Tuesday  since then he has had a cough dizziness

## 2021-09-17 LAB — URINE CULTURE: Culture: NO GROWTH

## 2021-11-17 NOTE — Progress Notes (Incomplete)
11/17/21 ?10:54 AM  ? ?Kyle Richards ?01/25/1946 ?[id number] ? ?Referring provider:  ?Cl[facility], [facility] ?[facility] ?[place],  [place] [address] ?No chief complaint on file. ? ? ?Urological history  ?1. High risk hematuria ?-non-smoker ?-RUS 06/2020 Normal ultrasound appearance of the kidneys-Enlarged prostate ?-cysto 06/2020 Enlarged prostate with significant bilobar coaptation - Mild bladder neck ?-no reports of gross heme ?-UA negative for micro heme ?  ?2. Incomplete emptying ?-managed with [facility] x 2-3 ?  ?3. Bilateral hydroceles ?-scrotal ultrasound 03/2021 - small bilateral hydroceles ?  ?4. Epididymitis ?-scrotal ultrasound 03/2021 - right sided epididymitis ?  ?5. BPH with incomplete bladder emptying ?-PSA 1.6 in 2019  ?-prostate volume 45 cc ? ? ?HPI: ?Kyle Richards is a 76 y.o.male who presents today for further evaluation of UTI symptoms. ? ? ? ? ? ?PMH: ?Past Medical History:  ?Diagnosis Date  ? Diabetes ([place])   ? Heart attack [facility]) 2012  ? High cholesterol   ? Hypertension   ? Neuropathy   ? ? ?Surgical History: ?Past Surgical History:  ?Procedure Laterality Date  ? FINGER FRACTURE SURGERY Left   ? middle finger left hand  ? TONSILLECTOMY AND ADENOIDECTOMY  1953  ? ? ?Home Medications:  ?Allergies as of 11/18/2021   ? ?   Reactions  ? Fentanyl Other (See Comments)  ? "Stopped breathing"  ? ?  ? ?  ?Medication List  ?  ? ?  ? Accurate as of November 17, 2021 10:54 AM. If you have any questions, ask your nurse or doctor.  ?  ?  ? ?  ? ?aspirin 81 MG chewable tablet ?Chew 81 mg by mouth daily. ?  ?atorvastatin 20 MG tablet ?Commonly known as: LIPITOR ?Take 20 mg by mouth daily. ?  ?finasteride 5 MG tablet ?Commonly known as: PROSCAR ?Take 5 mg by mouth daily. ?  ?lisinopril 5 MG tablet ?Commonly known as: ZESTRIL ?Take 5 mg by mouth daily. ?  ?metFORMIN 500 MG tablet ?Commonly known as: GLUCOPHAGE ?Take 500 mg by mouth 2 (two) times daily. ?  ?metoprolol succinate 25 MG 24 hr  tablet ?Commonly known as: Toprol XL ?Take 1 tablet (25 mg total) by mouth daily. ?  ?tamsulosin 0.4 MG Caps capsule ?Commonly known as: FLOMAX ?Take 1 capsule (0.4 mg total) by mouth daily. ?  ?VITAMIN D PO ?Take 1 tablet by mouth daily. ?  ? ?  ? ? ?Allergies:  ?Allergies  ?Allergen Reactions  ? Fentanyl Other (See Comments)  ?  "Stopped breathing"  ? ? ?Family History: ?No family history on file. ? ?Social History:  reports that he has never smoked. He has never used smokeless tobacco. He reports current alcohol use. He reports that he does not use drugs. ? ? ?Physical Exam: ?There were no vitals taken for this visit.  ?Constitutional:  Alert and oriented, No acute distress. ?HEENT: [place] AT, moist mucus membranes.  Trachea midline, no masses. ?Cardiovascular: No clubbing, cyanosis, or edema. ?Respiratory: Normal respiratory effort, no increased work of breathing. ?GI: Abdomen is soft, nontender, nondistended, no abdominal masses ?GU: No CVA tenderness ?Lymph: No cervical or inguinal lymphadenopathy. ?Skin: No rashes, bruises or suspicious lesions. ?Neurologic: Grossly intact, no focal deficits, moving all 4 extremities. ?Psychiatric: Normal mood and affect. ? ? ?Laboratory Data: ? ?Lab Results  ?Component Value Date  ? CREATININE 1.32 (H) 09/16/2021  ? ?Component ?    Latest Ref Rng & Units 09/16/2021  ?Sodium ?    135 -  145 mmol/L 133 (L)  ?Potassium ?    3.5 - 5.1 mmol/L 4.2  ?Chloride ?    98 - 111 mmol/L 102  ?CO2 ?    22 - 32 mmol/L 22  ?Glucose ?    70 - 99 mg/dL 152 (H)  ?BUN ?    8 - 23 mg/dL 18  ?Creatinine ?    0.61 - 1.24 mg/dL 1.32 (H)  ?Calcium ?    8.9 - 10.3 mg/dL 8.8 (L)  ?GFR, Estimated ?    >60 mL/min 56 (L)  ?Anion gap ?    5 - 15 9  ? ? ?Urinalysis ? ? ?Pertinent Imaging: ? ? ?Assessment & Plan:   ? ? ?No follow-ups on file. ? ?[facility] ?[address] ?[place],  [address] ?[phone] ? ?I,Kailey Littlejohn,acting as a Education administrator for [employer],  PA-C.,have documented all relevant documentation on the behalf of [place], PA-C,as directed by  [facility], PA-C while in the presence of [place], PA-C. ? ?

## 2021-11-18 ENCOUNTER — Ambulatory Visit: Payer: Medicare Other | Admitting: Urology

## 2021-11-18 ENCOUNTER — Other Ambulatory Visit: Payer: Self-pay | Admitting: *Deleted

## 2021-11-18 DIAGNOSIS — R319 Hematuria, unspecified: Secondary | ICD-10-CM

## 2021-11-22 NOTE — Progress Notes (Signed)
12/04/21 ?12:44 PM  ? ?Kyle Richards ?Jan 22, 1946 ?[id number] ? ?Referring provider:  ?Clinic, [facility] ?[facility] ?[place],  [place] [address] ? ?Chief Complaint  ?Patient presents with  ? Follow-up  ? Urinary Tract Infection  ? ? ?Urological history  ?1. High risk hematuria ?-non-smoker ?-RUS 06/2020 Normal ultrasound appearance of the kidneys-Enlarged prostate ?-cysto 06/2020 Enlarged prostate with significant bilobar coaptation - Mild bladder neck ?-no reports of gross heme ?-UA 6-10 RBC's - likely infected  ?  ?2. Incomplete emptying ?-managed with [facility] x 2-3 ?  ?3. Bilateral hydroceles ?-scrotal ultrasound 03/2021 - small bilateral hydroceles ?  ?4. Epididymitis ?-scrotal ultrasound 03/2021 - right sided epididymitis ?  ?5. BPH with incomplete bladder emptying ?-PSA 1.6 in 2019  ?-prostate volume 45 cc ?-PVR 0 mL  ? ?HPI: ?Kyle Richards is a 76 y.o.male who presents today for further evaluation of UTI symptoms.  ? ?He reports that he can void manually with 300 ml in his bladder he has not been able to urinate on his own and he would have to catheterize himself it was accompanied by burning. He has had diarrhea.  ? ?[name]  Patient denies any gross hematuria, dysuria or suprapubic/flank pain.  Patient denies any fevers, chills, nausea or vomiting.  ? ?UA yellow cloudy, nitrate positive, 11-20 WBCs, 6-10 RBCs and many bacteria. ? ?PVR  ? ?PMH: ?Past Medical History:  ?Diagnosis Date  ? Diabetes ([place])   ? Heart attack [facility]) 2012  ? High cholesterol   ? Hypertension   ? Neuropathy   ? ? ?Surgical History: ?Past Surgical History:  ?Procedure Laterality Date  ? FINGER FRACTURE SURGERY Left   ? middle finger left hand  ? TONSILLECTOMY AND ADENOIDECTOMY  1953  ? ? ?Home Medications:  ?Allergies as of 11/25/2021   ? ?   Reactions  ? Fentanyl Other (See Comments)  ? "Stopped breathing"  ? ?  ? ?  ?Medication List  ?  ? ?  ? Accurate as of  November 25, 2021 11:59 PM. If you have any questions, ask your nurse or doctor.  ?  ?  ? ?  ? ?aspirin 81 MG chewable tablet ?Chew 81 mg by mouth daily. ?  ?atorvastatin 20 MG tablet ?Commonly known as: LIPITOR ?Take 20 mg by mouth daily. ?  ?finasteride 5 MG tablet ?Commonly known as: PROSCAR ?Take 5 mg by mouth daily. ?  ?lisinopril 5 MG tablet ?Commonly known as: ZESTRIL ?Take 5 mg by mouth daily. ?  ?metFORMIN 500 MG tablet ?Commonly known as: GLUCOPHAGE ?Take 500 mg by mouth 2 (two) times daily. ?  ?metoprolol succinate 25 MG 24 hr tablet ?Commonly known as: Toprol XL ?Take 1 tablet (25 mg total) by mouth daily. ?  ?sulfamethoxazole-trimethoprim 800-160 MG tablet ?Commonly known as: BACTRIM DS ?Take 1 tablet by mouth every 12 (twelve) hours. ?Started by: Zara Council, PA-C ?  ?tamsulosin 0.4 MG Caps capsule ?Commonly known as: FLOMAX ?Take 1 capsule (0.4 mg total) by mouth daily. ?  ?VITAMIN D PO ?Take 1 tablet by mouth daily. ?  ? ?  ? ? ?Allergies:  ?Allergies  ?Allergen Reactions  ? Fentanyl Other (See Comments)  ?  "Stopped breathing"  ? ? ?Family History: ?No family history on file. ? ?Social History:  reports that he has never smoked. He has never been exposed to tobacco smoke. He has never used smokeless tobacco. He reports current alcohol use. He reports that  he does not use drugs. ? ? ?Physical Exam: ?BP 136/80   Pulse 70   Ht '5\' 8"'$  (1.727 m)   Wt 195 lb (88.5 kg)   BMI 29.65 kg/m?   ?Constitutional:  Alert and oriented, No acute distress. ?HEENT: [place] AT, moist mucus membranes.  Trachea midline, no masses. ?Cardiovascular: No clubbing, cyanosis, or edema. ?Respiratory: Normal respiratory effort, no increased work of breathing. ?Skin: No rashes, bruises or suspicious lesions. ?Neurologic: Grossly intact, no focal deficits, moving all 4 extremities. ?Psychiatric: Normal mood and affect. ? ? ?Laboratory Data: ? ?Lab Results  ?Component Value Date  ? CREATININE 1.32 (H) 09/16/2021  ? ?Component ?     Latest Ref Rng 09/10/2021 09/16/2021  ?BUN ?    8 - 23 mg/dL 15  18   ?Creatinine ?    0.61 - 1.24 mg/dL 1.21  1.32 (H)   ?Sodium ?    135 - 145 mmol/L 134 (L)  133 (L)   ?Potassium ?    3.5 - 5.1 mmol/L 4.3  4.2   ?Chloride ?    98 - 111 mmol/L 102  102   ?CO2 ?    22 - 32 mmol/L 25  22   ?Glucose ?    70 - 99 mg/dL 169 (H)  152 (H)   ?Calcium ?    8.9 - 10.3 mg/dL 9.0  8.8 (L)   ?Anion gap ?    5 - '15  7  9   '$ ?GFR, Estimated ?    >60 mL/min >60  56 (L)   ?Total Protein ?    6.5 - 8.1 g/dL 7.0    ?Albumin ?    3.5 - 5.0 g/dL 4.3    ?AST ?    15 - 41 U/L 28    ?ALT ?    0 - 44 U/L 29    ?Alkaline Phosphatase ?    38 - 126 U/L 58    ?Total Bilirubin ?    0.3 - 1.2 mg/dL 0.6    ?  ?(L) Low ?(H) High ? ? ?Pertinent Imaging and Urinalysis ?Results for orders placed or performed during the hospital encounter of 11/25/21  ?Urine Culture  ? Specimen: Urine, Random  ?Result Value Ref Range  ? Specimen Description    ?  URINE, RANDOM ?Performed at [facility], [address] [place], [place] [address] ?  ? Special Requests    ?  NONE ?Performed at [facility], [address], [place], [place] [address] ?  ? Culture >=100,000 COLONIES/mL ESCHERICHIA COLI (A)   ? Report Status 11/28/2021 FINAL   ? Organism ID, Bacteria ESCHERICHIA COLI (A)   ?    Susceptibility  ? Escherichia coli - MIC*  ?  AMPICILLIN >=32 RESISTANT Resistant   ?  CEFAZOLIN <=4 SENSITIVE Sensitive   ?  CEFEPIME <=0.12 SENSITIVE Sensitive   ?  CEFTRIAXONE <=0.25 SENSITIVE Sensitive   ?  CIPROFLOXACIN <=0.25 SENSITIVE Sensitive   ?  GENTAMICIN >=16 RESISTANT Resistant   ?  IMIPENEM <=0.25 SENSITIVE Sensitive   ?  NITROFURANTOIN <=16 SENSITIVE Sensitive   ?  TRIMETH/SULFA >=320 RESISTANT Resistant   ?  AMPICILLIN/SULBACTAM >=32 RESISTANT Resistant   ?  PIP/TAZO <=4 SENSITIVE Sensitive   ?  * >=100,000 COLONIES/mL ESCHERICHIA COLI  ?Urinalysis, Complete w Microscopic  ?Result Value Ref Range  ? Color, Urine YELLOW YELLOW  ? APPearance  CLOUDY (A) CLEAR  ? Specific Gravity, Urine 1.020 1.005 -  1.030  ? pH 5.5 5.0 - 8.0  ? Glucose, UA NEGATIVE NEGATIVE mg/dL  ? Hgb urine dipstick NEGATIVE NEGATIVE  ? Bilirubin Urine NEGATIVE NEGATIVE  ? Ketones, ur NEGATIVE NEGATIVE mg/dL  ? Protein, ur NEGATIVE NEGATIVE mg/dL  ? Nitrite POSITIVE (A) NEGATIVE  ? Leukocytes,Ua TRACE (A) NEGATIVE  ? Squamous Epithelial / LPF 0-5 0 - 5  ? WBC, UA 11-20 0 - 5 WBC/hpf  ? RBC / HPF 6-10 0 - 5 RBC/hpf  ? Bacteria, UA MANY (A) NONE SEEN  ?Results for orders placed or performed in visit on 11/25/21  ?BLADDER SCAN AMB NON-IMAGING  ?Result Value Ref Range  ? Scan Result 79m   ? ? ? ?Assessment & Plan:   ? ?Microscopic Hematuria/Suspected UTI ?- Urinalysis nitrite positive, trace leuks, and many bacteria  ?- Prescribed bactrim today  ?- Urine Culture; Pending ? ?2. Incomplete bladder emptying  ?- Managed with [facility] x2-3  ?- He is emptying adequately today with PVR of 063m ? ?Return in about 1 month (around 12/26/2021) [place] and [place] ? ?[facility] ?[place] [phone] ?I,Kailey Littlejohn,acting as a scEducation administratoror SHFederal-MogulPA-C.,have documented all relevant documentation on the behalf of SHAldersonPA-C,as directed by  SHMississippi Eye Surgery CenterPA-C while in the presence of SHBunkiePA-C. ? ?

## 2021-11-25 ENCOUNTER — Other Ambulatory Visit: Payer: Self-pay

## 2021-11-25 ENCOUNTER — Other Ambulatory Visit
Admission: RE | Admit: 2021-11-25 | Discharge: 2021-11-25 | Disposition: A | Discharge status: Home / Self Care | Payer: Medicare Other | Attending: Urology | Admitting: Urology

## 2021-11-25 ENCOUNTER — Ambulatory Visit (INDEPENDENT_AMBULATORY_CARE_PROVIDER_SITE_OTHER): Payer: Medicare Other | Admitting: Urology

## 2021-11-25 ENCOUNTER — Encounter: Payer: Self-pay | Admitting: Urology

## 2021-11-25 VITALS — BP 136/80 | HR 70 | Ht 68.0 in | Wt 195.0 lb

## 2021-11-25 DIAGNOSIS — R319 Hematuria, unspecified: Secondary | ICD-10-CM | POA: Diagnosis not present

## 2021-11-25 DIAGNOSIS — R3989 Other symptoms and signs involving the genitourinary system: Secondary | ICD-10-CM

## 2021-11-25 LAB — URINALYSIS, COMPLETE (UACMP) WITH MICROSCOPIC
Bilirubin Urine: NEGATIVE
Glucose, UA: NEGATIVE mg/dL
Hgb urine dipstick: NEGATIVE
Ketones, ur: NEGATIVE mg/dL
Nitrite: POSITIVE — AB
Protein, ur: NEGATIVE mg/dL
Specific Gravity, Urine: 1.02 (ref 1.005–1.030)
pH: 5.5 (ref 5.0–8.0)

## 2021-11-25 LAB — BLADDER SCAN AMB NON-IMAGING

## 2021-11-25 MED ORDER — SULFAMETHOXAZOLE-TRIMETHOPRIM 800-160 MG PO TABS: 1.0000 | ORAL_TABLET | Freq: Two times a day (BID) | ORAL | 0 refills | Status: DC

## 2021-11-25 NOTE — Patient Instructions (Signed)
Venita Lick, NP  [phone] ?

## 2021-11-28 LAB — URINE CULTURE: Culture: >=100000 — AB

## 2021-11-29 ENCOUNTER — Other Ambulatory Visit: Payer: Self-pay | Admitting: *Deleted

## 2021-11-29 MED ORDER — CEFUROXIME AXETIL 500 MG PO TABS: 500.0000 mg | ORAL_TABLET | Freq: Two times a day (BID) | ORAL | 0 refills | Status: AC

## 2021-12-17 ENCOUNTER — Telehealth: Payer: Self-pay

## 2021-12-17 NOTE — Telephone Encounter (Signed)
No pcp on file, requested updates from Care Everywhere and upcoming appointment on 04.25.23

## 2021-12-17 NOTE — Telephone Encounter (Signed)
Request EKGs from January at [facility] ER, [phone] -  Velna Hatchet

## 2021-12-23 ENCOUNTER — Emergency Department
Admission: EM | Admit: 2021-12-23 | Discharge: 2021-12-24 | Disposition: A | Discharge status: Home / Self Care | Payer: Non-veteran care | Attending: Emergency Medicine | Admitting: Emergency Medicine

## 2021-12-23 DIAGNOSIS — I10 Essential (primary) hypertension: Secondary | ICD-10-CM | POA: Insufficient documentation

## 2021-12-23 DIAGNOSIS — R110 Nausea: Secondary | ICD-10-CM | POA: Insufficient documentation

## 2021-12-23 DIAGNOSIS — E119 Type 2 diabetes mellitus without complications: Secondary | ICD-10-CM | POA: Insufficient documentation

## 2021-12-23 DIAGNOSIS — Z7984 Long term (current) use of oral hypoglycemic drugs: Secondary | ICD-10-CM | POA: Insufficient documentation

## 2021-12-23 DIAGNOSIS — R319 Hematuria, unspecified: Secondary | ICD-10-CM | POA: Insufficient documentation

## 2021-12-23 DIAGNOSIS — E785 Hyperlipidemia, unspecified: Secondary | ICD-10-CM | POA: Insufficient documentation

## 2021-12-23 DIAGNOSIS — R030 Elevated blood-pressure reading, without diagnosis of hypertension: Secondary | ICD-10-CM

## 2021-12-23 DIAGNOSIS — Z79899 Other long term (current) drug therapy: Secondary | ICD-10-CM | POA: Insufficient documentation

## 2021-12-23 LAB — ECG 12-LEAD
P Wave Axis: -20 deg
P-R Interval: 160 ms
Patient Age: 75 years
Q-T Interval(Corrected): 408 ms
Q-T Interval: 395 ms
QRS Axis: 2 deg
QRS Duration: 101 ms
T Axis: 59 years
Ventricular Rate: 64 //min

## 2021-12-23 LAB — VH URINALYSIS WITH MICROSCOPIC AND CULTURE IF INDICATED
Bilirubin, UA: NEGATIVE
Glucose, UA: NEGATIVE mg/dL
Ketones UA: NEGATIVE mg/dL
Leukocyte Esterase, UA: NEGATIVE Leu/uL
Nitrite, UA: NEGATIVE
Protein, UR: NEGATIVE mg/dL
RBC, UA: 22 /hpf — ABNORMAL HIGH (ref 0–4)
Squam Epithel, UA: <1 /hpf (ref 0–2)
Urine Specific Gravity: 1.013 (ref 1.001–1.040)
Urobilinogen, UA: NORMAL mg/dL
WBC, UA: <1 /hpf (ref 0–4)
pH, Urine: 6 pH (ref 5.0–8.0)

## 2021-12-23 LAB — I-STAT CHEM 8 CARTRIDGE
Anion Gap I-Stat: 19 — ABNORMAL HIGH (ref 7.0–16.0)
BUN I-Stat: 19 mg/dL (ref 7–22)
Calcium Ionized I-Stat: 4.9 mg/dL (ref 4.35–5.10)
Chloride I-Stat: 101 mMol/L (ref 98–110)
Creatinine I-Stat: 1.1 mg/dL (ref 0.80–1.30)
EGFR: 70 mL/min/{1.73_m2} (ref 60–150)
Glucose I-Stat: 150 mg/dL — ABNORMAL HIGH (ref 71–99)
Hematocrit I-Stat: 47 % (ref 39.0–52.5)
Hemoglobin I-Stat: 16 gm/dL (ref 13.0–17.5)
Potassium I-Stat: 4.1 mMol/L (ref 3.5–5.3)
Sodium I-Stat: 141 mMol/L (ref 136–147)
TCO2 I-Stat: 26 mMol/L (ref 24–29)

## 2021-12-23 LAB — VH I-STAT CHEM 8 NOTIFICATION

## 2021-12-23 MED ORDER — LORAZEPAM 0.5 MG PO TABS
0.5000 mg | ORAL_TABLET | Freq: Once | ORAL | Status: DC
Start: 2021-12-23 — End: 2021-12-23

## 2021-12-23 NOTE — ED Triage Notes (Signed)
Patient to room C11/C11-A    Duane Thomas is a 76 y.o. male presenting to the ED for Hypertension    The patient arrived by private car and is alone.    Nursing (triage) note reviewed for the following pertinent information:  Pt reports feeling queasy for the past 2 hours, check BP & it was 164/93 (typically 105/80) & repeat was 175/110. Reports no CP or SOB. Denies any HA. 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 Hx of 6 CABG. Hx of HTN & takes baby ASA, Metoprolol & Lisinopril & has been taking consistently with no missed doses.

## 2021-12-23 NOTE — ED Provider Notes (Signed)
EMERGENCY DEPARTMENT  History and Physical Exam       Patient Name: Duane Thomas, Duane Thomas  Encounter Date:  12/23/2021  Attending Physician: Jane Canary, D.O.  PCP: Marisa Sprinkles, MD  Patient DOB:  [birth year]  MRN:  [id number]  Room:  C11/C11-A      History of Presenting Illness     Chief complaint: Hypertension    HPI/ROS is limited by: none  HPI/ROS given by: Patient    Duane Thomas is a 76 y.o. male who presents to the ED with complaint of feeling queasy at home for the last 2 hours.  Patient took his home blood pressure and it was elevated at 164/93 and he was concerned about that.  He repeated and it was 175/110 so he comes in the emergency department evaluation.  Patient denies chest pain shortness of breath or any other symptoms.  Has a history of neurogenic bladder and intermittently self caths.He has an appointment with his cardiologist tomorrow for routine care and blood pressure medication management.  He is on metoprolol and lisinopril for his blood pressure and has not missed any doses.         Review of Systems   Review of Systems   Constitutional:  Negative for activity change, chills and fever.   HENT:  Negative for congestion, ear pain, sinus pain, sore throat and trouble swallowing.    Eyes:  Negative for redness.   Respiratory:  Negative for cough, chest tightness, shortness of breath and wheezing.    Cardiovascular:  Negative for chest pain and leg swelling.   Gastrointestinal:  Positive for nausea. Negative for abdominal pain, constipation and diarrhea.   Genitourinary:  Negative for difficulty urinating and urgency.   Musculoskeletal:  Negative for neck stiffness.   Skin:  Negative for rash.   Neurological:  Negative for weakness and numbness.   All other systems reviewed and are negative.       Other pertinent review of systems findings in history of present illness.  Allergies & Medications     Pt is allergic to fentanyl.    Current/Home Medications    ASPIRIN 81 MG CHEWABLE TABLET    Chew 81  mg by mouth daily.    ATORVASTATIN (LIPITOR) 20 MG TABLET    Take 20 mg by mouth daily    CHOLECALCIFEROL (VITAMIN D) 1000 UNIT TABLET    Take 1,000 Units by mouth daily.    FINASTERIDE (PROPECIA) 1 MG TABLET    Take 5 mg by mouth daily       LISINOPRIL (ZESTRIL) 5 MG TABLET    Take 5 mg by mouth    METFORMIN (GLUCOPHAGE) 500 MG TABLET    Take 1 tablet (500 mg total) by mouth 2 (two) times daily with meals    METOPROLOL SUCCINATE XL (TOPROL-XL) 25 MG 24 HR TABLET    Take 25 mg by mouth 2 (two) times daily       TAMSULOSIN (FLOMAX) 0.4 MG CAP    Take 0.4 mg by mouth Daily after dinner    UNKNOWN TO PATIENT            Past Medical History     Pt   Past Medical History:   Diagnosis Date    Coronary artery disease     s/p CABG    Enlarged prostate     Hyperlipidemia     Hypertension     Neurogenic amyotrophy     Neuropathy     Type 2  diabetes mellitus, controlled         Past Surgical History     Pt   Past Surgical History:   Procedure Laterality Date    CARDIAC SURGERY      bypass x6    COLONOSCOPY N/A 01/09/2015    Procedure: COLONOSCOPY;  Surgeon: Chauncey Fischer, MD;  Location: [facility];  Service: Gastroenterology;  Laterality: N/A;    COLONOSCOPY N/A 08/09/2018    Procedure: COLONOSCOPY;  Surgeon: Chauncey Fischer, MD;  Location: [facility];  Service: Gastroenterology;  Laterality: N/A;    CORONARY ARTERY BYPASS GRAFT      left elbow      TONSILLECTOMY          Family History     The family history includes No known problems in his father and mother.     Social History     Pt reports that he has never smoked. He has never used smokeless tobacco. He reports that he does not drink alcohol and does not use drugs.     Physical Exam     Blood pressure 146/75, pulse (!) 54, temperature 98.1 F (36.7 C), temperature source Oral, resp. rate 18, height 1.702 m, weight 89.5 kg, SpO2 96 %.    Vitals signs reviewed.    Physical Exam  Vitals and nursing note reviewed.   Constitutional:       General: He is  not in acute distress.     Appearance: He is well-developed. He is not diaphoretic.   HENT:      Head: Normocephalic and atraumatic.      Right Ear: External ear normal.      Left Ear: External ear normal.      Nose: Nose normal.      Mouth/Throat:      Mouth: Mucous membranes are moist.   Eyes:      Extraocular Movements: Extraocular movements intact.      Conjunctiva/sclera: Conjunctivae normal.   Neck:      Vascular: No JVD.      Trachea: No tracheal deviation.   Cardiovascular:      Rate and Rhythm: Normal rate and regular rhythm.      Pulses: Normal pulses.      Heart sounds: Normal heart sounds.   Pulmonary:      Effort: Pulmonary effort is normal. No respiratory distress.      Breath sounds: Normal breath sounds. No wheezing.   Abdominal:      General: There is no distension.      Palpations: Abdomen is soft.      Tenderness: There is no abdominal tenderness. There is no guarding or rebound.   Musculoskeletal:         General: Normal range of motion.      Cervical back: Normal range of motion and neck supple.      Right lower leg: No edema.      Left lower leg: No edema.   Lymphadenopathy:      Cervical: No cervical adenopathy.   Skin:     General: Skin is warm and dry.      Capillary Refill: Capillary refill takes less than 2 seconds.      Findings: No rash.   Neurological:      General: No focal deficit present.      Mental Status: He is alert and oriented to person, place, and time.      Cranial Nerves: No cranial nerve deficit.  Sensory: No sensory deficit.      Coordination: Coordination normal.   Psychiatric:         Mood and Affect: Mood normal.         Behavior: Behavior normal.            Diagnostic Results     The results of the diagnostic studies have been reviewed by myself:    Radiologic Studies  No results found.    Results       Procedure Component Value Units Date/Time    Urinalysis w Microscopic and Culture if Indicated [[id number]]  (Abnormal) Collected: 12/23/21 2333    Specimen: Urine,  Random Updated: 12/23/21 2347     Color, UA Yellow     Clarity, UA Clear     Urine Specific Gravity 1.013     pH, Urine 6.0 pH      Protein, UR Negative mg/dL      Glucose, UA Negative mg/dL      Ketones UA Negative mg/dL      Bilirubin, UA Negative     Blood, UA Moderate     Nitrite, UA Negative     Urobilinogen, UA Normal mg/dL      Leukocyte Esterase, UA Negative Leu/uL      UR Micro Performed     WBC, UA <1 /hpf      RBC, UA 22 /hpf      Squam Epithel, UA <1 /hpf     i-Stat Chem 8 CartrIDge [[id number]]  (Abnormal) Collected: 12/23/21 1955    Specimen: Blood Updated: 12/23/21 2001     Sodium I-Stat 141 mMol/L      Potassium I-Stat 4.1 mMol/L      Chloride I-Stat 101 mMol/L      TCO2 I-Stat 26 mMol/L      Calcium Ionized I-Stat 4.90 mg/dL      Glucose I-Stat 782 mg/dL      Creatinine I-Stat 1.10 mg/dL      BUN I-Stat 19 mg/dL      Anion Gap I-Stat 95.6     EGFR 70 mL/min/1.69m2      Hematocrit I-Stat 47.0 %      Hemoglobin I-Stat 16.0 gm/dL     Chem 8 plus only (i-STAT) [[id number]] Collected: 12/23/21 1930    Specimen: ISTAT Updated: 12/23/21 1949     I-STAT Notification Istat Notification                  Last EKG Result       Procedure Component Value Units Date/Time    ECG 12 lead (Stat) (Cardiac Related) [[id number]] Collected: 12/23/21 1920     Updated: 12/23/21 2210     Patient Age 55 years      Patient DOB August 11, 1946     Patient Height --     Patient Weight --     Interpretation Text --     Sinus rhythm Rate 64  Low voltage, precordial leads  RSR' in V1 or V2, right VCD or RVH  Borderline T abnormalities, anterior leads  Baseline wander in lead(s) V1,V2,V3,V4,V6  No previous ECG available for comparison  Electronically Signed On 12-23-2021 22:09:54 EDT by Parke Poisson Wie       Physician Interpreter Parke Poisson [facility]     Ventricular Rate 64 //min      QRS Duration 101 ms      P-R Interval 160 ms      Q-T Interval 395 ms      Q-T Interval(Corrected)  408 ms      P Wave Axis -20 deg      QRS Axis 2 deg      T Axis 59  years             This EKG was formally interpreted by me at time of care.                Consultation     None    Medical Decision Making       }  Medical Decision Making  The patient presented with elevated blood pressure today and is clinically well appearing. Evaluation and treatment of this patient has revealed no signs of end organ damage and the patients blood pressure has improved.  Differential diagnosis has included but is not limited essential hypertension, medical non-compliance, renal issues, and endocrine causes for high blood pressure.  There appears to be a low risk for any life-threatening or end organ issue and therefore discharge home is appropriate with appropriate follow-up as an outpatient. Diagnostic impression and plan were discussed and agreed upon with the patient and/or family.  If performed results of lab/radiology tests were reviewed and discussed with the patient and/or family. All questions were answered and concerns addressed.  I emphasized the need for the patient to take any prescribed medications and arrange close follow-up with their primary care physician.  Should the patient have severe headache, chest pain or and acute issues they should return to the ER immediately for re-evaluation.     Amount and/or Complexity of Data Reviewed  Labs: ordered. Decision-making details documented in ED Course.        ED Course as of 12/24/21 0044   Tue Dec 24, 2021   0043 Urinalysis w Microscopic and Culture if Indicated(!)  Interpreted by me as small amount of red blood cells consistent with self-catheterization no evidence of infection [DV]   0043 i-Stat Chem 8 CartrIDge(!)  Interpreted by me as normal except for slightly elevated glucose at 150 [DV]      ED Course User Index  [DV] Linna Darner, Hardie Shackleton., DO       ED Medication Orders (From admission, onward)      Start Ordered     Status Ordering Provider    12/23/21 2220 12/23/21 2219    Once in ED        Route: Oral  Ordered Dose: 0.5 mg        Discontinued VAN WIE, Ry Moody F JR.                In addition to the above history, please see nursing notes. Allergies, meds, past medical, family, social hx, and the results of the diagnostic studies performed have been reviewed by myself.     Diagnosis / Disposition     Clinical Impression  1. Elevated blood pressure reading    2. Hematuria, unspecified type        Disposition  ED Disposition       ED Disposition   Discharge    Condition   --    Date/Time   Tue Dec 24, 2021 12:41 AM    Comment   Duane Thomas discharge to home/self care.    Condition at disposition: Stable                   Follow up for Discharged Patients  No follow-up provider specified.    Prescriptions for Discharged Patients  New Prescriptions  No medications on file                     Procedures   NONE    Procedures            Claudius Sis Wie, D.O.    Note:  This chart was generated by the Epic EMR system/speech recognition and may contain inherent errors or omissions not intended by the user. Grammatical errors, random word insertions, deletions, pronoun errors and incomplete sentences are occasional consequences of this technology due to software limitations. Not all errors are caught or corrected. If there are questions or concerns about the content of this note or information contained within the body of this dictation they should be addressed directly with the author for clarification          Izora Gala., DO  12/24/21 6045

## 2021-12-24 ENCOUNTER — Other Ambulatory Visit
Admission: RE | Admit: 2021-12-24 | Discharge: 2021-12-24 | Disposition: A | Discharge status: Home / Self Care | Payer: No Typology Code available for payment source | Source: Ambulatory Visit | Attending: Cardiovascular Disease | Admitting: Cardiovascular Disease

## 2021-12-24 ENCOUNTER — Encounter: Payer: Self-pay | Admitting: Cardiovascular Disease

## 2021-12-24 ENCOUNTER — Ambulatory Visit: Payer: No Typology Code available for payment source | Admitting: Cardiovascular Disease

## 2021-12-24 VITALS — BP 128/72 | HR 84 | Wt 197.1 lb

## 2021-12-24 DIAGNOSIS — I2510 Atherosclerotic heart disease of native coronary artery without angina pectoris: Secondary | ICD-10-CM

## 2021-12-24 DIAGNOSIS — Z951 Presence of aortocoronary bypass graft: Secondary | ICD-10-CM

## 2021-12-24 DIAGNOSIS — I10 Essential (primary) hypertension: Secondary | ICD-10-CM | POA: Insufficient documentation

## 2021-12-24 DIAGNOSIS — E785 Hyperlipidemia, unspecified: Secondary | ICD-10-CM | POA: Insufficient documentation

## 2021-12-24 DIAGNOSIS — I340 Nonrheumatic mitral (valve) insufficiency: Secondary | ICD-10-CM

## 2021-12-24 LAB — LIPID PANEL
Cholesterol: 150 mg/dL (ref 75–199)
Coronary Heart Disease Risk: 3.57
HDL: 42 mg/dL (ref 40–55)
LDL Calculated: 90 mg/dL
Triglycerides: 92 mg/dL (ref 10–150)
VLDL: 18 (ref 0–40)

## 2021-12-24 NOTE — Discharge Instructions (Signed)
Follow-up with your doctor today as already scheduled and with your urologist as scheduled.

## 2021-12-24 NOTE — Progress Notes (Signed)
Cardiology Follow-Up      Patient Name: Duane Thomas   Date of Birth: 1946-03-24    Provider: Rosalyn Gess, MD     Patient Care Team:  Pcp, None, MD as PCP - General  Halimah Bewick, Theodora Blow, MD as Consulting Physician (Cardiology)    Chief Complaint: Coronary Artery Disease and Hypertension      History of Present Illness   Duane Thomas is a 76 y.o. male being seen today for follow up of CAD  s/p CABG in 2011 in the setting of an MI, moderate MR, Type II DM. He has history of neurogenic bladder, treats with intermittent self cath.     He was last seen in clinic 12/12/2020. He was in the ER yesterday due to elevated BP (174/110). This was after he had drunk 6-7 cups of coffee and had chinese food the night before. Prior to his, he was hospitalized 08/2021 for AKI.     He checks his blood pressure at home, usually 105-115/75-80, HR 60.     He denies chest pain, shortness of breath, lightheadedness, dizziness, palpitations, leg edema, syncope.     Review of Systems   Constitution: Negative for activity change, appetite change, fatigue, fever, and unexpected weight change  HENT: Negative for trouble swallowing, and voice change  Eyes: Positive for L cataract  Respiratory: Negative for apnea and/or awakening with sob, chest tightness, and sob  Cardiovascular: Negative for chest pain/discomfort, leg or ankle swelling, difficulty breathing lying down, and palpitations  Gastrointestinal: Negative for abdominal pain, blood in stool, nausea, and vomiting  Endocrine: Negative for cold intolerance, heat intolerance, and polydipsia  GU: Negative for flank pain, frequent urination Positive for trace hematuria   Musculoskeletal: Negative for gait problems, joint swelling, neck pain, and pain or heaviness in legs  Skin: Negative for rash, wound, and ulcers on legs/feet  Neurological: Negative for dizziness, headaches, and syncope  Hematologic: Negative for bruises  Psychiatric: Negative for anxiety, and sleep  disturbance    Comprehensive review of systems performed by me. Unless otherwise noted, all systems are negative.    Past Medical History     PMH 12/24/20 AB     Labs 12/23/21, 08/30/21 in Epic    LOV 12/12/20 SPA  EKG 12/23/21    PCP 01/24/20 note scanned   [facility] 12/23/21 : HTN    - CAD   A) CABG 2011   B) Echo 12/02/13 : Mild concentric LVH. EF >70%. Grade I DD. RV is normal in size and function. MV appears normal in structure and function. RV systolic pressure is estimated to be ENL. AV appears normal in structure and function. No hemodynamically significant valvular aortic stenosis. Aortic root is normal size.  C) Echo 12/12/20 :  LV is normal in size. There is mild concentric LVH. SF is normal with an estimated EF = 60-65%. Contrast agent was administered and successfully    enabled adequate endocardial definition.  No regional wall motion abnormalities detected. There is grade II  DD of LV.  LA is moderately enlarged.  MR is moderate. RVSP is estimated at 40-33mmHg.     - Bradycardia  - HTN   - Hyperlipidemia   - LUE pain  - Neurogenic bladder  - DM type II   - Vit D deficiency    Past Surgical History     Past Surgical History:   Procedure Laterality Date    CARDIAC SURGERY      bypass x6  COLONOSCOPY N/A 01/09/2015    Procedure: COLONOSCOPY;  Surgeon: Chauncey Fischer, MD;  Location: [facility];  Service: Gastroenterology;  Laterality: N/A;    COLONOSCOPY N/A 08/09/2018    Procedure: COLONOSCOPY;  Surgeon: Chauncey Fischer, MD;  Location: [facility];  Service: Gastroenterology;  Laterality: N/A;    CORONARY ARTERY BYPASS GRAFT      left elbow      TONSILLECTOMY         Family History     Family History   Problem Relation Age of Onset    No known problems Mother     No known problems Father        Social History     Social History     Tobacco Use    Smoking status: Never    Smokeless tobacco: Never   Vaping Use    Vaping status: Never Used   Substance Use Topics    Alcohol use: No    Drug use: No        Allergies     is allergic to fentanyl.    Medications       Current Outpatient Medications:     aspirin 81 MG chewable tablet, Chew 81 mg by mouth daily., Disp: , Rfl:     atorvastatin (LIPITOR) 20 MG tablet, Take 20 mg by mouth daily, Disp: , Rfl:     Cholecalciferol (VITAMIN D) 1000 UNIT tablet, Take 1,000 Units by mouth daily., Disp: , Rfl:     finasteride (PROPECIA) 1 MG tablet, Take 5 mg by mouth daily  , Disp: , Rfl:     lisinopril (ZESTRIL) 5 MG tablet, Take 5 mg by mouth, Disp: , Rfl:     metFORMIN (GLUCOPHAGE) 500 MG tablet, Take 1 tablet (500 mg total) by mouth 2 (two) times daily with meals, Disp: 60 tablet, Rfl: 0    metoprolol succinate XL (TOPROL-XL) 25 MG 24 hr tablet, Take 25 mg by mouth daily, Disp: , Rfl:     tamsulosin (FLOMAX) 0.4 MG Cap, Take 0.4 mg by mouth Daily after dinner, Disp: , Rfl:     UNKNOWN TO PATIENT, , Disp: , Rfl:   No current facility-administered medications for this visit.    Physical Exam   Visit Vitals  BP 128/72 (BP Site: Right arm, Patient Position: Sitting)   Pulse 84   Wt 89.4 kg (197 lb 1.6 oz)   BMI 30.87 kg/m     Vitals:    12/24/21 0913   BP: 128/72   BP Site: Right arm   Patient Position: Sitting   Pulse: 84   Weight: 89.4 kg (197 lb 1.6 oz)     Wt Readings from Last 3 Encounters:   12/24/21 89.4 kg (197 lb 1.6 oz)   12/23/21 89.5 kg (197 lb 5 oz)   08/29/21 89.8 kg (197 lb 15.6 oz)        Constitutional -  Well appearing, and in no distress  Eyes - Extraocular eye movements intact, sclera anicteric  Oropharynx - Moist mucous membranes  Respiratory - Clear to auscultation bilaterally, normal respiratory effort    Cardiovascular system -    Regular rate and rhythm    Normal S1, S2    No murmurs, rubs, gallops   Jugular venous pulse is normal        Carotid upstroke normal, no carotid bruits auscultated    Neurological - Alert, oriented, no focal neurological deficits  Extremities -  No clubbing or cyanosis. No  peripheral edema  Skin - Warm and dry  Psych-  Appropriate affect    Labs     Lab Results   Component Value Date/Time    WBC 6.6 08/30/2021 04:30 AM    RBC 4.13 08/30/2021 04:30 AM    HGB 12.9 (L) 08/30/2021 04:30 AM    HCT 39.3 08/30/2021 04:30 AM    PLT 213 08/30/2021 04:30 AM       Lab Results   Component Value Date/Time    NA 141 08/30/2021 04:30 AM    K 4.9 08/30/2021 04:30 AM    CL 111 (H) 08/30/2021 04:30 AM    CO2 21 08/30/2021 04:30 AM    GLU 120 (H) 08/30/2021 04:30 AM    BUN 17 08/30/2021 04:30 AM    CREAT 1.10 12/23/2021 07:55 PM    CREAT 1.07 08/30/2021 04:30 AM    PROT 5.4 (L) 08/30/2021 04:30 AM    ALKPHOS 62 08/30/2021 04:30 AM    AST 13 08/30/2021 04:30 AM    ALT 14 08/30/2021 04:30 AM       No results found for: CHOL, TRIG, HDL, LDL    Lab Results   Component Value Date/Time    HGBA1CPERCNT 7.2 08/29/2021 05:21 AM       Cardiogenics:     EKG:     Impression and Recommendations:     1. CAD s/p CABG in 2011 - he is asymptomatic.   - Continue ASA 81mg  and lipitor 20mg  daily   - Will repeat lipid panel      2. Neurogenic bladder      3. HLD   - Continue current dose of statin   - Will repeat lipid panel      5. HTN - BP at goal today and at home, recent ER visit for hypertensive urgency   - Continue current regimen      6. Bradycardia - asymptomatic HR 50-60s.   - Continue current dose of BB     7. Mitral regurgitation - moderate on TTE 11/2020.   - Will repeat TTE    Electronically signed by:   Rosalyn Gess, MD  12/24/2021

## 2021-12-25 NOTE — Telephone Encounter (Signed)
Labs received and scanned to 4.26.23 Scan Only

## 2021-12-25 NOTE — Progress Notes (Signed)
12/26/21 ?2:24 PM  ? ?Kyle Richards ?[birth year] ?[id number] ? ?Referring provider:  ?Clinic, [facility] ?[facility] ?[place],  [place] [address] ? ?Chief Complaint  ?Patient presents with  ? Follow-up  ? ? ?Urological history  ?1. High risk hematuria ?-non-smoker ?-RUS 06/2020 Normal ultrasound appearance of the kidneys-Enlarged prostate ?-cysto 06/2020 Enlarged prostate with significant bilobar coaptation - Mild bladder neck ?-no reports of gross heme ?-UA negative for heme ?  ?2. Incomplete emptying ?-managed with [facility] x 2-3 ?  ?3. Bilateral hydroceles ?-scrotal ultrasound 03/2021 - small bilateral hydroceles ?  ?4. Epididymitis ?-scrotal ultrasound 03/2021 - right sided epididymitis ?  ?5. BPH with incomplete bladder emptying ?-prostate volume 45 cc ? ? ?HPI: ?Kyle Richards is a 76 y.o.male who presents today for a 1 month follow-up for UA and symptoms recheck.  ? ?He has no urinary issues at this time.  [name]  Patient denies any gross hematuria, dysuria or suprapubic/flank pain.  Patient denies any fevers, chills, nausea or vomiting.   ? ?UA clear ? ?PMH: ?Past Medical History:  ?Diagnosis Date  ? Diabetes ([place])   ? Heart attack [facility]) 2012  ? High cholesterol   ? Hypertension   ? Neuropathy   ? ? ?Surgical History: ?Past Surgical History:  ?Procedure Laterality Date  ? FINGER FRACTURE SURGERY Left   ? middle finger left hand  ? TONSILLECTOMY AND ADENOIDECTOMY  1953  ? ? ?Home Medications:  ?Allergies as of 12/26/2021   ? ?   Reactions  ? Fentanyl Other (See Comments)  ? "Stopped breathing"  ? ?  ? ?  ?Medication List  ?  ? ?  ? Accurate as of December 26, 2021  2:24 PM. If you have any questions, ask your nurse or doctor.  ?  ?  ? ?  ? ?aspirin 81 MG chewable tablet ?Chew 81 mg by mouth daily. ?  ?atorvastatin 20 MG tablet ?Commonly known as: LIPITOR ?Take 20 mg by mouth daily. ?  ?finasteride 5 MG tablet ?Commonly known as:  PROSCAR ?Take 5 mg by mouth daily. ?  ?lisinopril 5 MG tablet ?Commonly known as: ZESTRIL ?Take 5 mg by mouth daily. ?  ?metFORMIN 500 MG tablet ?Commonly known as: GLUCOPHAGE ?Take 500 mg by mouth 2 (two) times daily. ?  ?metoprolol succinate 25 MG 24 hr tablet ?Commonly known as: Toprol XL ?Take 1 tablet (25 mg total) by mouth daily. ?  ?tamsulosin 0.4 MG Caps capsule ?Commonly known as: FLOMAX ?Take 1 capsule (0.4 mg total) by mouth daily. ?  ?VITAMIN D PO ?Take 1 tablet by mouth daily. ?  ? ?  ? ? ?Allergies:  ?Allergies  ?Allergen Reactions  ? Fentanyl Other (See Comments)  ?  "Stopped breathing"  ? ? ?Family History: ?No family history on file. ? ?Social History:  reports that he has never smoked. He has never been exposed to tobacco smoke. He has never used smokeless tobacco. He reports current alcohol use. He reports that he does not use drugs. ? ? ?Physical Exam: ?BP (!) 153/88   Pulse 75   Ht '5\' 8"'$  (1.727 m)   Wt 195 lb (88.5 kg)   BMI 29.65 kg/m?   ?Constitutional:  Well nourished. Alert and oriented, No acute distress. ?HEENT: [place] AT, moist mucus membranes.  Trachea midline ?Cardiovascular: No clubbing, cyanosis, or edema. ?Respiratory: Normal respiratory effort, no increased work of breathing. ?Neurologic: Grossly intact, no focal deficits, moving all 4  extremities. ?Psychiatric: Normal mood and affect.  ? ?Laboratory Data: ?Cholesterol 75 - 199 mg/dL 150   ?Triglycerides 10 - 150 mg/dL 92   ?HDL 40 - 55 mg/dL 42   ?LDL Calculated mg/dL 90   ?Coronary Heart Disease Risk  3.57   ?VLDL 0 - 40 18   ?Comment: Lipid Panel Interpretive Comment:  ?*Triglycerides >400 mg/dL Invalidates LDL calculation.  ?*Normal Values for HDL valid only for patients over 60 years of age  ? HDL levels less than 40 mg/dL are a positive risk factor for Coronary Heart Disease.  ? HDL levels over 60 mg/dL are a negative risk factor for Coronary Heart Disease  ?*LDL Interpretation  ?   Optimal                          <100 mg/dL   ?   Near Optimal/Above Optimal  100 - 129 mg/dL  ?   Borderline High             130 - 159 mg/dL  ?   High                        160 - 189 mg/dL  ?   Very High                        >189 mg/dL  ?*Coronary Heart Disease Risk:  ?  (Chol/HDL)     Male       Male  ?   1/2 Average   3.43        3.27  ?   Average       4.94        4.44  ?   2X Average    9.55        7.05  ?   3X Average   23.99       11.04  ?The above 6 analytes were performed by [facility] [phone])  ?[address],[place] [address]  ?[facility] [phone])  ?Specimen Collected: 12/24/21 10:25 Last Resulted: 12/24/21 16:47  ?Received From: [facility]  Result Received: 12/25/21 07:46  ? ? ?Urinalysis ?Results for orders placed or performed in visit on 12/26/21  ?Microscopic Examination  ? Urine  ?Result Value Ref Range  ? WBC, UA 0-5 0 - 5 /hpf  ? RBC 0-2 0 - 2 /hpf  ? Epithelial Cells (non renal) >10 (A) 0 - 10 /hpf  ? Bacteria, UA None seen None seen/Few  ?Urinalysis, Complete  ?Result Value Ref Range  ? Specific Gravity, UA 1.025 1.005 - 1.030  ? pH, UA 5.5 5.0 - 7.5  ? Color, UA Yellow Yellow  ? Appearance Ur Clear Clear  ? Leukocytes,UA Negative Negative  ? Protein,UA Negative Negative/Trace  ? Glucose, UA Negative Negative  ? Ketones, UA Negative Negative  ? RBC, UA 2+ (A) Negative  ? Bilirubin, UA Negative Negative  ? Urobilinogen, Ur 0.2 0.2 - 1.0 mg/dL  ? Nitrite, UA Negative Negative  ? Microscopic Examination See below:   ?  ?I have reviewed the labs.  ? ?Pertinent Imaging: ?No recent imaging ? ?Assessment & Plan:   ? ?1. Incomplete bladder emptying ?- managed with [facility] x 2-3 ?- refills given through the [place] ? ?2. UTI ?-resolved ? ?3. High risk hematuria ?-work up 2021 - enlarged  prostate ?-no reports of gross heme ?-UA negative for micro heme ? ?Return for keep follow up in 06/2022. ? ?[facility] ?[address], [address] ?[place], [place] [address] ?[phone] ?

## 2021-12-26 ENCOUNTER — Ambulatory Visit (INDEPENDENT_AMBULATORY_CARE_PROVIDER_SITE_OTHER): Payer: Medicare Other | Admitting: Urology

## 2021-12-26 ENCOUNTER — Encounter: Payer: Self-pay | Admitting: Cardiology

## 2021-12-26 ENCOUNTER — Encounter: Payer: Self-pay | Admitting: Urology

## 2021-12-26 VITALS — BP 153/88 | HR 75 | Ht 68.0 in | Wt 195.0 lb

## 2021-12-26 DIAGNOSIS — R3914 Feeling of incomplete bladder emptying: Secondary | ICD-10-CM

## 2021-12-26 DIAGNOSIS — R339 Retention of urine, unspecified: Secondary | ICD-10-CM | POA: Diagnosis not present

## 2021-12-26 DIAGNOSIS — R319 Hematuria, unspecified: Secondary | ICD-10-CM | POA: Diagnosis not present

## 2021-12-26 LAB — URINALYSIS, COMPLETE
Bilirubin, UA: NEGATIVE
Glucose, UA: NEGATIVE
Ketones, UA: NEGATIVE
Leukocytes,UA: NEGATIVE
Nitrite, UA: NEGATIVE
Protein,UA: NEGATIVE
Specific Gravity, UA: 1.025 (ref 1.005–1.030)
Urobilinogen, Ur: 0.2 mg/dL (ref 0.2–1.0)
pH, UA: 5.5 (ref 5.0–7.5)

## 2021-12-26 LAB — MICROSCOPIC EXAMINATION
Bacteria, UA: NONE SEEN
Epithelial Cells (non renal): >10 /hpf — AB (ref 0–10)

## 2021-12-27 ENCOUNTER — Telehealth: Payer: Self-pay | Admitting: Cardiovascular Disease

## 2021-12-27 MED ORDER — ATORVASTATIN CALCIUM 40 MG PO TABS
40.0000 mg | ORAL_TABLET | Freq: Every day | ORAL | 1 refills | Status: AC
Start: 2021-12-27 — End: ?

## 2021-12-27 NOTE — Telephone Encounter (Signed)
Increase lipitor to 40mg  given LDL > 70

## 2021-12-30 ENCOUNTER — Emergency Department
Admission: EM | Admit: 2021-12-30 | Discharge: 2021-12-30 | Disposition: A | Discharge status: Home / Self Care | Payer: No Typology Code available for payment source | Attending: Emergency Medicine | Admitting: Emergency Medicine

## 2021-12-30 ENCOUNTER — Encounter: Payer: Self-pay | Admitting: Emergency Medicine

## 2021-12-30 ENCOUNTER — Other Ambulatory Visit: Payer: Self-pay

## 2021-12-30 DIAGNOSIS — W57XXXA Bitten or stung by nonvenomous insect and other nonvenomous arthropods, initial encounter: Secondary | ICD-10-CM | POA: Insufficient documentation

## 2021-12-30 DIAGNOSIS — S30860A Insect bite (nonvenomous) of lower back and pelvis, initial encounter: Secondary | ICD-10-CM | POA: Diagnosis not present

## 2021-12-30 DIAGNOSIS — E119 Type 2 diabetes mellitus without complications: Secondary | ICD-10-CM | POA: Insufficient documentation

## 2021-12-30 DIAGNOSIS — S3992XA Unspecified injury of lower back, initial encounter: Secondary | ICD-10-CM | POA: Diagnosis present

## 2021-12-30 DIAGNOSIS — I119 Hypertensive heart disease without heart failure: Secondary | ICD-10-CM | POA: Insufficient documentation

## 2021-12-30 MED ORDER — DOXYCYCLINE HYCLATE 100 MG PO TABS: 100.0000 mg | ORAL_TABLET | Freq: Two times a day (BID) | ORAL | 0 refills | Status: AC

## 2021-12-30 NOTE — ED Triage Notes (Signed)
Pt via POV from home. Pt was bitten twice by 1 tick. 1 on his back and 1 on his abd on Saturday. Denies any pain, pt is here to get the medication to prevent Lyme Disease. Pt is A&OX4 and NAD ?

## 2021-12-30 NOTE — ED Provider Notes (Signed)
? ?[facility] ?Provider Note ? ? ? Event Date/Time  ? First MD Initiated Contact with Patient 12/30/21 1111   ?  (approximate) ? ? ?History  ? ?Insect Bite ? ? ?HPI ? ?Kyle Richards is a 76 y.o. male history of heart disease diabetes and hypertension presents emergency department with concerns of a tick bite.  Patient pulled a tick bite off of his lower pelvis, states that he had a bite on his lower back that is red and swollen.  Is concerned he might get Lyme's disease.  Called the [place] and they told him to come the emergency department for medications.  Denies any fever or chills.  Symptoms x2 days ? ?  ? ? ?Physical Exam  ? ?Triage Vital Signs: ?ED Triage Vitals  ?Enc Vitals Group  ?   BP 12/30/21 1050 (!) 145/75  ?   Pulse Rate 12/30/21 1050 66  ?   Resp 12/30/21 1050 16  ?   Temp 12/30/21 1050 97.7 ?F (36.5 ?C)  ?   Temp Source 12/30/21 1050 Oral  ?   SpO2 12/30/21 1050 96 %  ?   Weight 12/30/21 1051 192 lb 1 oz (87.1 kg)  ?   Height 12/30/21 1051 '5\' 8"'$  (1.727 m)  ?   Head Circumference --   ?   Peak Flow --   ?   Pain Score 12/30/21 1056 0  ?   Pain Loc --   ?   Pain Edu? --   ?   Excl. in [place]? --   ? ? ?Most recent vital signs: ?Vitals:  ? 12/30/21 1050  ?BP: (!) 145/75  ?Pulse: 66  ?Resp: 16  ?Temp: 97.7 ?F (36.5 ?C)  ?SpO2: 96%  ? ? ? ?General: Awake, no distress.   ?CV:  Good peripheral perfusion. regular rate and  rhythm ?Resp:  Normal effort. Lungs CTA ?Abd:  No distention.   ?Other:  In with raised red area on lower back, scab noted on the pubic area ? ? ?ED Results / Procedures / Treatments  ? ?Labs ?(all labs ordered are listed, but only abnormal results are displayed) ?Labs Reviewed - No data to display ? ? ?EKG ? ? ? ? ?RADIOLOGY ? ? ? ? ?PROCEDURES: ? ? ?Procedures ? ? ?MEDICATIONS ORDERED IN ED: ?Medications - No data to display ? ? ?IMPRESSION / MDM / ASSESSMENT AND PLAN / ED COURSE  ?I reviewed the triage vital signs and the nursing notes. ?             ?                ? ?Differential diagnosis includes, but is not limited to, recommend spotted fever, Lyme's disease,*tick disease, infected tick bite ? ?Due to the patient's concerns of Lyme's disease [facility] spotted fever we will treat him with doxycycline for 14 days.  He is to follow-up with his regular doctor if not improving 3 days.  Return emergency department if worsening.  Patient is in agreement treatment plan.  Discharged stable condition. ? ?  ? ? ?FINAL CLINICAL IMPRESSION(S) / ED DIAGNOSES  ? ?Final diagnoses:  ?Tick bite of lower back, initial encounter  ? ? ? ?Rx / DC Orders  ? ?ED Discharge Orders   ? ?      Ordered  ?  doxycycline (VIBRA-TABS) 100 MG tablet  2 times daily       ? 12/30/21 1125  ? ?  ?  ? ?  ? ? ? ?  Note:  This document was prepared using Dragon voice recognition software and may include unintentional dictation errors. ? ?  ?Versie Starks, PA-C ?12/30/21 1129 ? ?  ?Harvest Dark, MD ?12/30/21 1357 ? ?

## 2022-01-01 ENCOUNTER — Ambulatory Visit (INDEPENDENT_AMBULATORY_CARE_PROVIDER_SITE_OTHER): Payer: No Typology Code available for payment source | Admitting: Dermatology

## 2022-01-01 DIAGNOSIS — L918 Other hypertrophic disorders of the skin: Secondary | ICD-10-CM

## 2022-01-01 DIAGNOSIS — L570 Actinic keratosis: Secondary | ICD-10-CM

## 2022-01-01 DIAGNOSIS — D229 Melanocytic nevi, unspecified: Secondary | ICD-10-CM

## 2022-01-01 DIAGNOSIS — L814 Other melanin hyperpigmentation: Secondary | ICD-10-CM | POA: Diagnosis not present

## 2022-01-01 DIAGNOSIS — Z1283 Encounter for screening for malignant neoplasm of skin: Secondary | ICD-10-CM

## 2022-01-01 DIAGNOSIS — L821 Other seborrheic keratosis: Secondary | ICD-10-CM

## 2022-01-01 DIAGNOSIS — L820 Inflamed seborrheic keratosis: Secondary | ICD-10-CM

## 2022-01-01 DIAGNOSIS — B351 Tinea unguium: Secondary | ICD-10-CM

## 2022-01-01 DIAGNOSIS — L578 Other skin changes due to chronic exposure to nonionizing radiation: Secondary | ICD-10-CM

## 2022-01-01 DIAGNOSIS — D1800 Hemangioma unspecified site: Secondary | ICD-10-CM

## 2022-01-01 NOTE — Patient Instructions (Signed)

## 2022-01-01 NOTE — Progress Notes (Signed)
? ?New Patient Visit ? ?Subjective  ?Kyle Richards is a 76 y.o. male who presents for the following: Annual Exam (Patient has lesions on the back/shoulders, thighs - no hx of skin CA per pt ). The patient presents for Total-Body Skin Exam (TBSE) for skin cancer screening and mole check.  The patient has spots, moles and lesions to be evaluated, some may be new or changing. ? ?The following portions of the chart were reviewed this encounter and updated as appropriate:  ? Tobacco  Allergies  Meds  Problems  Med Hx  Surg Hx  Fam Hx   ?  ?Review of Systems:  No other skin or systemic complaints except as noted in HPI or Assessment and Plan. ? ?Objective  ?Well appearing patient in no apparent distress; mood and affect are within normal limits. ? ?A full examination was performed including scalp, head, eyes, ears, nose, lips, neck, chest, axillae, abdomen, back, buttocks, bilateral upper extremities, bilateral lower extremities, hands, feet, fingers, toes, fingernails, and toenails. All findings within normal limits unless otherwise noted below. ? ?Trunk x 20 (20) ?Erythematous stuck-on, waxy papule or plaque ? ?Face and scalp x 12 (12) ?Erythematous thin papules/macules with gritty scale.  ? ?B/L toenails, feet ?Toenail dystrophy and scale of the feet. ? ? ?Assessment & Plan  ?Inflamed seborrheic keratosis (20) ?Trunk x 20 ?Destruction of lesion - Trunk x 20 ?Complexity: simple   ?Destruction method: cryotherapy   ?Informed consent: discussed and consent obtained   ?Timeout:  patient name, date of birth, surgical site, and procedure verified ?Lesion destroyed using liquid nitrogen: Yes   ?Region frozen until ice ball extended beyond lesion: Yes   ?Outcome: patient tolerated procedure well with no complications   ?Post-procedure details: wound care instructions given   ? 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 ?Tinea unguium ?B/L toenails, feet ?Chronic and persistent condition with duration or expected duration over one year. Condition is symptomatic / bothersome to patient. Not to goal. ?Start Ketoconazole 2% cream to feet QHS.  ? ?Skin cancer screening ? ?Lentigines ?- Scattered tan macules ?- Due to sun exposure ?- Benign-appearing, observe ?- Recommend daily broad spectrum sunscreen SPF 30+ to sun-exposed areas, reapply every 2 hours as needed. ?- Call for any changes ? ?Seborrheic Keratoses ?- Stuck-on, waxy, tan-brown papules and/or plaques  ?- Benign-appearing ?- Discussed benign etiology and prognosis. ?- Observe ?- Call for any changes ? ?Melanocytic Nevi ?- Tan-brown and/or pink-flesh-colored symmetric macules and papules ?- Benign appearing on exam today ?- Observation ?- Call clinic for new or changing moles ?- Recommend daily use of broad spectrum spf 30+ sunscreen to sun-exposed areas.  ? ?Hemangiomas ?- Red papules ?- Discussed benign nature ?- Observe ?- Call for any changes ? ?Actinic Damage ?- Chronic condition, secondary to cumulative UV/sun exposure ?- diffuse scaly erythematous macules with underlying dyspigmentation ?- Recommend daily broad spectrum sunscreen SPF 30+ to sun-exposed areas, reapply every 2 hours as needed.  ?- Staying in the shade or wearing long sleeves, sun glasses (UVA+UVB  protection) and wide brim hats (4-inch brim around the entire circumference of the hat) are also recommended for sun protection.  ?- Call for new or changing lesions. ? ?Acrochordons (Skin Tags) ?- Fleshy, skin-colored pedunculated papules ?-  Benign appearing.  ?- Observe. ?- If desired, they can be removed with an in office procedure that is not covered by insurance. ?- Please call the clinic if you notice any new or changing lesions. ? ?Skin cancer screening performed today. ? ?Return in about 6 months (around 07/04/2022) for recheck AK's, ISK's . ? ?IRudell Cobb, CMA, am acting as scribe for Sarina Ser, MD . ?Documentation: I have reviewed the above documentation for accuracy and completeness, and I agree with the above. ? ?Sarina Ser, MD ? ? ?

## 2022-01-09 ENCOUNTER — Observation Stay
Admission: EM | Admit: 2022-01-09 | Discharge: 2022-01-10 | Disposition: A | Discharge status: Home / Self Care | Payer: Non-veteran care | Attending: Emergency Medicine | Admitting: Emergency Medicine

## 2022-01-09 ENCOUNTER — Emergency Department: Payer: Non-veteran care

## 2022-01-09 ENCOUNTER — Telehealth: Payer: Self-pay

## 2022-01-09 DIAGNOSIS — N400 Enlarged prostate without lower urinary tract symptoms: Secondary | ICD-10-CM | POA: Insufficient documentation

## 2022-01-09 DIAGNOSIS — R778 Other specified abnormalities of plasma proteins: Secondary | ICD-10-CM | POA: Insufficient documentation

## 2022-01-09 DIAGNOSIS — I160 Hypertensive urgency: Principal | ICD-10-CM | POA: Insufficient documentation

## 2022-01-09 DIAGNOSIS — Z885 Allergy status to narcotic agent status: Secondary | ICD-10-CM | POA: Insufficient documentation

## 2022-01-09 DIAGNOSIS — Z951 Presence of aortocoronary bypass graft: Secondary | ICD-10-CM | POA: Insufficient documentation

## 2022-01-09 DIAGNOSIS — Z79899 Other long term (current) drug therapy: Secondary | ICD-10-CM | POA: Insufficient documentation

## 2022-01-09 DIAGNOSIS — E119 Type 2 diabetes mellitus without complications: Secondary | ICD-10-CM

## 2022-01-09 DIAGNOSIS — I10 Essential (primary) hypertension: Secondary | ICD-10-CM | POA: Insufficient documentation

## 2022-01-09 DIAGNOSIS — Z7982 Long term (current) use of aspirin: Secondary | ICD-10-CM | POA: Insufficient documentation

## 2022-01-09 DIAGNOSIS — E1140 Type 2 diabetes mellitus with diabetic neuropathy, unspecified: Secondary | ICD-10-CM | POA: Insufficient documentation

## 2022-01-09 DIAGNOSIS — Z7984 Long term (current) use of oral hypoglycemic drugs: Secondary | ICD-10-CM | POA: Insufficient documentation

## 2022-01-09 DIAGNOSIS — R5383 Other fatigue: Secondary | ICD-10-CM

## 2022-01-09 DIAGNOSIS — E785 Hyperlipidemia, unspecified: Secondary | ICD-10-CM | POA: Insufficient documentation

## 2022-01-09 DIAGNOSIS — I493 Ventricular premature depolarization: Secondary | ICD-10-CM | POA: Insufficient documentation

## 2022-01-09 DIAGNOSIS — I2510 Atherosclerotic heart disease of native coronary artery without angina pectoris: Secondary | ICD-10-CM | POA: Insufficient documentation

## 2022-01-09 HISTORY — DX: Other specified health status: Z78.9

## 2022-01-09 LAB — CBC AND DIFFERENTIAL
Basophils %: 0.2 % (ref 0.0–3.0)
Basophils Absolute: 0 10*3/uL (ref 0.0–0.3)
Eosinophils %: 0.8 % (ref 0.0–7.0)
Eosinophils Absolute: 0 10*3/uL (ref 0.0–0.8)
Hematocrit: 44.6 % (ref 39.0–52.5)
Hemoglobin: 14.3 gm/dL (ref 13.0–17.5)
Lymphocytes Absolute: 1.2 10*3/uL (ref 0.6–5.1)
Lymphocytes: 19.8 % (ref 15.0–46.0)
MCH: 30 pg (ref 28–35)
MCHC: 32 gm/dL (ref 32–36)
MCV: 92 fL (ref 80–100)
MPV: 8 fL (ref 6.0–10.0)
Monocytes Absolute: 0.3 10*3/uL (ref 0.1–1.7)
Monocytes: 5.5 % (ref 3.0–15.0)
Neutrophils %: 73.7 % (ref 42.0–78.0)
Neutrophils Absolute: 4.6 10*3/uL (ref 1.7–8.6)
PLT CT: 246 10*3/uL (ref 130–440)
RBC: 4.84 10*6/uL (ref 4.00–5.70)
RDW: 12.8 % (ref 11.0–14.0)
WBC: 6.2 10*3/uL (ref 4.0–11.0)

## 2022-01-09 LAB — COMPREHENSIVE METABOLIC PANEL
ALT: 20 U/L (ref 0–55)
AST (SGOT): 18 U/L (ref 10–42)
Albumin/Globulin Ratio: 1.5 Ratio (ref 0.80–2.00)
Albumin: 4.2 gm/dL (ref 3.5–5.0)
Alkaline Phosphatase: 71 U/L (ref 40–145)
Anion Gap: 13.9 mMol/L (ref 7.0–18.0)
BUN / Creatinine Ratio: 14.7 Ratio (ref 10.0–30.0)
BUN: 16 mg/dL (ref 7–22)
Bilirubin, Total: 1.4 mg/dL — ABNORMAL HIGH (ref 0.1–1.2)
CO2: 21 mMol/L (ref 20–30)
Calcium: 9.9 mg/dL (ref 8.5–10.5)
Chloride: 105 mMol/L (ref 98–110)
Creatinine: 1.09 mg/dL (ref 0.80–1.30)
EGFR: 71 mL/min/{1.73_m2} (ref 60–150)
Globulin: 2.8 gm/dL (ref 2.0–4.0)
Glucose: 156 mg/dL — ABNORMAL HIGH (ref 71–99)
Osmolality Calculated: 276 mOsm/kg (ref 275–300)
Potassium: 3.9 mMol/L (ref 3.5–5.3)
Protein, Total: 7 gm/dL (ref 6.0–8.3)
Sodium: 136 mMol/L (ref 136–147)

## 2022-01-09 LAB — VH DEXTROSE STICK GLUCOSE
Glucose POCT: 119 mg/dL — ABNORMAL HIGH (ref 71–99)
Glucose POCT: 101 mg/dL — ABNORMAL HIGH (ref 71–99)
Glucose POCT: 113 mg/dL — ABNORMAL HIGH (ref 71–99)

## 2022-01-09 LAB — VH URINALYSIS WITH MICROSCOPIC AND CULTURE IF INDICATED
Bilirubin, UA: NEGATIVE
Blood, UA: NEGATIVE
Glucose, UA: NEGATIVE mg/dL
Ketones UA: NEGATIVE mg/dL
Leukocyte Esterase, UA: 25 Leu/uL — AB
Nitrite, UA: NEGATIVE
Protein, UR: NEGATIVE mg/dL
RBC, UA: <1 /hpf (ref 0–4)
Squam Epithel, UA: 5 /hpf — ABNORMAL HIGH (ref 0–2)
Urine Specific Gravity: 1.013 (ref 1.001–1.040)
Urobilinogen, UA: NORMAL mg/dL
WBC, UA: 6 /hpf — ABNORMAL HIGH (ref 0–4)
pH, Urine: 5 pH (ref 5.0–8.0)

## 2022-01-09 LAB — ECG 12-LEAD
P Wave Axis: -26 deg
P-R Interval: 166 ms
Patient Age: 75 years
Q-T Interval(Corrected): 429 ms
Q-T Interval: 369 ms
QRS Axis: 2 deg
QRS Duration: 97 ms
T Axis: 37 years
Ventricular Rate: 81 //min

## 2022-01-09 LAB — HEMOGLOBIN A1C
Estimated Average Glucose: 140 mg/dL
Hgb A1C, %: 6.5 %

## 2022-01-09 LAB — TROPONIN I
Troponin I: 0.08 ng/mL — ABNORMAL HIGH (ref 0.00–0.02)
Troponin I: 0.07 ng/mL — ABNORMAL HIGH (ref 0.00–0.02)
Troponin I: 0.06 ng/mL — ABNORMAL HIGH (ref 0.00–0.02)

## 2022-01-09 LAB — MAGNESIUM: Magnesium: 1.7 mg/dL (ref 1.6–2.6)

## 2022-01-09 LAB — VH EXTRA SPECIMEN LABEL

## 2022-01-09 MED ORDER — ASPIRIN 81 MG PO CHEW
CHEWABLE_TABLET | ORAL | Status: AC
Start: 2022-01-09 — End: ?
  Filled 2022-01-09: qty 4

## 2022-01-09 MED ORDER — HYDRALAZINE HCL 20 MG/ML IJ SOLN
10.0000 mg | Freq: Four times a day (QID) | INTRAMUSCULAR | Status: DC | PRN
Start: 2022-01-09 — End: 2022-01-10

## 2022-01-09 MED ORDER — ACETAMINOPHEN 650 MG RE SUPP
650.0000 mg | RECTAL | Status: DC | PRN
Start: 2022-01-09 — End: 2022-01-10

## 2022-01-09 MED ORDER — INSULIN LISPRO (1 UNIT DIAL) 100 UNIT/ML SC SOPN
1.0000 [IU] | PEN_INJECTOR | Freq: Every evening | SUBCUTANEOUS | Status: DC
Start: 2022-01-09 — End: 2022-01-10

## 2022-01-09 MED ORDER — VH DOXYCYCLINE 100 MG PO (WRAP)
100.0000 mg | ORAL_CAPSULE | Freq: Two times a day (BID) | ORAL | Status: DC
Start: 2022-01-09 — End: 2022-01-10
  Administered 2022-01-09 – 2022-01-10 (×2): 100 mg via ORAL
  Filled 2022-01-09 (×2): qty 1

## 2022-01-09 MED ORDER — LISINOPRIL 10 MG PO TABS
20.0000 mg | ORAL_TABLET | Freq: Once | ORAL | Status: AC
Start: 2022-01-09 — End: 2022-01-09
  Administered 2022-01-09: 20 mg via ORAL
  Filled 2022-01-09: qty 2

## 2022-01-09 MED ORDER — ATORVASTATIN CALCIUM 40 MG PO TABS
40.0000 mg | ORAL_TABLET | Freq: Every day | ORAL | Status: DC
Start: 2022-01-09 — End: 2022-01-10
  Administered 2022-01-09: 40 mg via ORAL
  Filled 2022-01-09 (×2): qty 1

## 2022-01-09 MED ORDER — GLUCAGON 1 MG IJ SOLR (WRAP)
1.0000 mg | INTRAMUSCULAR | Status: DC | PRN
Start: 2022-01-09 — End: 2022-01-10

## 2022-01-09 MED ORDER — TAMSULOSIN HCL 0.4 MG PO CAPS
0.4000 mg | ORAL_CAPSULE | Freq: Every day | ORAL | Status: DC
Start: 2022-01-10 — End: 2022-01-10
  Administered 2022-01-10: 0.4 mg via ORAL
  Filled 2022-01-09: qty 1

## 2022-01-09 MED ORDER — METFORMIN HCL 500 MG PO TABS
500.0000 mg | ORAL_TABLET | Freq: Two times a day (BID) | ORAL | Status: DC
Start: 2022-01-09 — End: 2022-01-10
  Administered 2022-01-09 – 2022-01-10 (×2): 500 mg via ORAL
  Filled 2022-01-09 (×2): qty 1

## 2022-01-09 MED ORDER — ACETAMINOPHEN 160 MG/5ML PO SOLN
650.0000 mg | ORAL | Status: DC | PRN
Start: 2022-01-09 — End: 2022-01-10

## 2022-01-09 MED ORDER — MORPHINE SULFATE 2 MG/ML IJ/IV SOLN (WRAP)
2.0000 mg | Status: DC | PRN
Start: 2022-01-09 — End: 2022-01-10

## 2022-01-09 MED ORDER — ASPIRIN 81 MG PO CHEW
324.0000 mg | CHEWABLE_TABLET | Freq: Once | ORAL | Status: AC
Start: 2022-01-09 — End: 2022-01-09
  Administered 2022-01-09: 324 mg via ORAL

## 2022-01-09 MED ORDER — ONDANSETRON 4 MG PO TBDP
4.0000 mg | ORAL_TABLET | Freq: Three times a day (TID) | ORAL | Status: DC | PRN
Start: 2022-01-09 — End: 2022-01-10

## 2022-01-09 MED ORDER — VH DEXTROSE 10 % IV BOLUS (ADULT)
125.0000 mL | INTRAVENOUS | Status: DC | PRN
Start: 2022-01-09 — End: 2022-01-10

## 2022-01-09 MED ORDER — FINASTERIDE 5 MG PO TABS
5.0000 mg | ORAL_TABLET | Freq: Every day | ORAL | Status: DC
Start: 2022-01-10 — End: 2022-01-10
  Administered 2022-01-10: 5 mg via ORAL
  Filled 2022-01-09: qty 1

## 2022-01-09 MED ORDER — NITROGLYCERIN 0.4 MG SL SUBL
0.4000 mg | SUBLINGUAL_TABLET | SUBLINGUAL | Status: DC | PRN
Start: 2022-01-09 — End: 2022-01-10

## 2022-01-09 MED ORDER — ONDANSETRON HCL 4 MG/2ML IJ SOLN
4.0000 mg | Freq: Three times a day (TID) | INTRAMUSCULAR | Status: DC | PRN
Start: 2022-01-09 — End: 2022-01-10

## 2022-01-09 MED ORDER — METOPROLOL SUCCINATE ER 25 MG PO TB24
25.0000 mg | ORAL_TABLET | Freq: Every day | ORAL | Status: DC
Start: 2022-01-10 — End: 2022-01-10
  Administered 2022-01-10: 25 mg via ORAL
  Filled 2022-01-09: qty 1

## 2022-01-09 MED ORDER — ASPIRIN 81 MG PO CHEW
81.0000 mg | CHEWABLE_TABLET | Freq: Every day | ORAL | Status: DC
Start: 2022-01-10 — End: 2022-01-10
  Administered 2022-01-10: 81 mg via ORAL
  Filled 2022-01-09: qty 1

## 2022-01-09 MED ORDER — SODIUM CHLORIDE (PF) 0.9 % IJ SOLN
0.4000 mg | INTRAMUSCULAR | Status: DC | PRN
Start: 2022-01-09 — End: 2022-01-10

## 2022-01-09 MED ORDER — LISINOPRIL 10 MG PO TABS
20.0000 mg | ORAL_TABLET | Freq: Every day | ORAL | Status: DC
Start: 2022-01-10 — End: 2022-01-10
  Administered 2022-01-10: 20 mg via ORAL
  Filled 2022-01-09: qty 2

## 2022-01-09 MED ORDER — HYDROCODONE-ACETAMINOPHEN 5-325 MG PO TABS
1.0000 | ORAL_TABLET | ORAL | Status: DC | PRN
Start: 2022-01-09 — End: 2022-01-10

## 2022-01-09 MED ORDER — SODIUM CHLORIDE (PF) 0.9 % IJ SOLN
3.0000 mL | Freq: Two times a day (BID) | INTRAMUSCULAR | Status: DC
Start: 2022-01-09 — End: 2022-01-10

## 2022-01-09 MED ORDER — INSULIN LISPRO (1 UNIT DIAL) 100 UNIT/ML SC SOPN
1.0000 [IU] | PEN_INJECTOR | Freq: Every day | SUBCUTANEOUS | Status: DC | PRN
Start: 2022-01-09 — End: 2022-01-10

## 2022-01-09 MED ORDER — INSULIN LISPRO (1 UNIT DIAL) 100 UNIT/ML SC SOPN
1.0000 [IU] | PEN_INJECTOR | Freq: Three times a day (TID) | SUBCUTANEOUS | Status: DC
Start: 2022-01-10 — End: 2022-01-10

## 2022-01-09 MED ORDER — ACETAMINOPHEN 325 MG PO TABS
650.0000 mg | ORAL_TABLET | ORAL | Status: DC | PRN
Start: 2022-01-09 — End: 2022-01-10

## 2022-01-09 MED ORDER — LISINOPRIL 5 MG PO TABS
5.0000 mg | ORAL_TABLET | Freq: Every day | ORAL | Status: DC
Start: 2022-01-10 — End: 2022-01-09

## 2022-01-09 NOTE — Progress Notes (Signed)
NURSE NOTE SUMMARY  [facility] - CLINICAL OBSERVATION UNIT   Patient Name: Duane Thomas   Attending Physician: Myna Bright, MD   Today's date:   01/09/2022 LOS: 0 days   Shift Summary:                                                              1700: Assumed care of pt, assessment completed. See flowsheet. Pt denies pain. Pt oriented to room and educated on use of call bell. Pt able to ambulate to bathroom with minimal assistance. Call bell within reach. Will continue to monitor.       Provider Notifications:        Rapid Response Notifications:  Mobility:      PMP Activity: Step 6 - Walks in Room (01/09/2022  6:00 PM)     Weight tracking:  Family Dynamic:   Last 3 Weights for the past 72 hrs (Last 3 readings):   Weight   01/09/22 1720 89 kg (196 lb 3.4 oz)   01/09/22 1049 89 kg (196 lb 3.4 oz)             Last Bowel Movement   Last BM Date: 01/09/22

## 2022-01-09 NOTE — ED Triage Notes (Signed)
Patient was at his home this AM, he felt that something was not right so he took his BP. He states it was 180/113. He went to his cardiologist office, where the nurse at the office sent him to the ER.

## 2022-01-09 NOTE — Plan of Care (Signed)
Problem: Safety  Goal: Patient will be free from injury during hospitalization  01/09/2022 1750 by Roneisha Stern, Anderson Malta, RN  Outcome: Progressing  Flowsheets (Taken 01/09/2022 1750)  Patient will be free from injury during hospitalization:   Assess patient's risk for falls and implement fall prevention plan of care per policy   Provide and maintain safe environment   Use appropriate transfer methods   Ensure appropriate safety devices are available at the bedside   Include patient/ family/ care giver in decisions related to safety   Hourly rounding   Assess for patients risk for elopement and implement Elopement Risk Plan per policy   Provide alternative method of communication if needed (communication boards, writing)  01/09/2022 1749 by Robina Ade, RN  Outcome: Progressing     Problem: Inadequate Tissue Perfusion  Goal: Adequate tissue perfusion will be maintained  01/09/2022 1750 by Khamila Bassinger, Anderson Malta, RN  Outcome: Progressing  Flowsheets (Taken 01/09/2022 1750)  Adequate tissue perfusion will be maintained:   Monitor/assess vital signs   Monitor/assess lab values and report abnormal values   Monitor/assess neurovascular status (pulses, capillary refill, pain, paresthesia, paralysis, presence of edema)   VTE Prevention: Administer anticoagulant(s) and/or apply anti-embolism stockings/devices as ordered   Monitor for signs and symptoms of a pulmonary embolism (dyspnea, tachypnea, tachycardia, confusion)   Elevate feet   Increase mobility as tolerated/progressive mobility   Place shoes or other foot protection on patient   Assess and monitor skin integrity   Provide wound/skin care   Position patient for maximum circulation/cardiac output  01/09/2022 1749 by Ethlyn Alto, Anderson Malta, RN  Outcome: Progressing     Problem: Ineffective Gas Exchange  Goal: Effective breathing pattern  Outcome: Progressing  Flowsheets (Taken 01/09/2022 1750)  Effective breathing pattern:   Maintain O2 saturation level per LIP order   Monitor  for sleep apnea   Monitor for medication induced respiratory depression

## 2022-01-09 NOTE — ED Provider Notes (Signed)
[facility]  EMERGENCY DEPARTMENT  History and Physical Exam     Patient Name: Duane Thomas, Duane Thomas  Encounter Date:  01/09/2022  Attending Physician: Dow Adolph, MD  Room:  [id number]  Patient DOB:  July 17, 1946  Age: 76 y.o. male  MRN:  [id number]  PCP: Pcp, None, MD      Diagnosis/Disposition:  MDM:     Final Impression  1. Elevated troponin    2. Hypertension, unspecified type      Disposition  ED Disposition       ED Disposition   Admit    Condition   --    Date/Time   Thu Jan 09, 2022  3:08 PM    Comment   Service: Observation Unit [[id number]]               Follow up  No follow-up provider specified.  Prescriptions  New Prescriptions    No medications on file       ED Summary:  History obtained from:Patient    Past Medical Records reviewed: Previous [place] ED Notes    Other Medical Conditions that Impact Care: Hypertension, hyperlipidemia, coronary artery disease, cardiac surgery, diabetes    Differential Diagnosis Considered: ACS/MI, endorgan damage, kidney damage, electrolyte derangement, metabolic disarray, dehydration, other etiologies considered    All tests were independently reviewed and interpreted by myself. The significant findings are discussed in ED course below      Procedures: No See Procedure Section below for procedure note for details    Meds Given In ED: (Red font indicate High Risk Medications that Required Monitoring for adverse effects or deterioration:  Medications   aspirin chewable tablet 324 mg (has no administration in time range)       Discussion about de-escalation of care or Code Status: No    Discussions regarding hospitalization or transfer: Yes: Hospitalization    Discussion with Consultants: Yes: Cardiology Dr. Monte Fantasia, sound hospitalist Dr. Annye Rusk, ED observation Dr. Kerrie Pleasure    Prescription Management: See Diagnosis/Disposition area for Prescription Management details    Social Determinants of Health that Impact Care: None that negatively impact  care      76 year old male with past medical history as below with heart score of 6 presents for feeling faint and hypertension from the cardiologist office.  Differential diagnosis as above.  Labs were ordered prior to my shift time.  I did order a chest x-ray as well as full dose aspirin for the patient.  We noted that his troponin is elevated at 0.08.  I do not have priors to compare it to.  Possible demand picture from his hypertension and possible hypertensive urgency.  I did discuss the case with cardiology, highly appreciate their consultation.  We will admit the patient at this time and we will trend his troponins.  I did discuss with the patient and shared decision making utilized.  He is admitted to ED observation Dr. Kerrie Pleasure at this time for further care and testing appreciate transfer of care.               The patient's past medical records, including those in Care Everywhere when necessary, were reviewed by me    The results of diagnostic studies have been reviewed by myself. Available past medical, family, social, and surgical histories have been reviewed by myself. The clinical impression and plan have been discussed with the patient and/or the patient's family. All questions have been answered.        History of  Presenting Illness:     Nursing Triage note: pt has been feeling faint, he was on his way to his cardiologist , and pressure was elevated 180's    Chief complaint: Fatigue and Hypertension    HPI/ROS is limited by: none  HPI/ROS given by: Patient    Duane Thomas is a 76 y.o. male past medical history of hypertension, hyperlipidemia, coronary artery disease, CABG, neurogenic amyotrophy, neuropathy, type 2 diabetes, enlarged prostate, cardiac surgery presenting with hypertension.  States he was feeling faint and not well and he went to his cardiologist office Dr. [name].  Found to be hypertensive there and was sent to the emergency department.  Denies any chest pain, headache, back  pain, abdominal pain, pain in extremities.      Review of Systems:  Physical Exam:       Review of Systems   Constitutional:         Feeling faint   Respiratory:  Negative for shortness of breath.    Cardiovascular:  Negative for chest pain.   Gastrointestinal:  Negative for abdominal pain.   Musculoskeletal:  Negative for back pain and myalgias.   Neurological:  Negative for headaches.   All other systems reviewed and are negative.      Blood pressure (S) 141/73, pulse 63, temperature 97.8 F (36.6 C), temperature source Oral, resp. rate 22, weight 89 kg, SpO2 97 %.     Physical Exam  Vitals and nursing note reviewed.   Constitutional:       General: He is not in acute distress.     Appearance: Normal appearance. He is not ill-appearing, toxic-appearing or diaphoretic.   HENT:      Head: Normocephalic and atraumatic.      Right Ear: External ear normal.      Left Ear: External ear normal.      Nose: Nose normal. No congestion or rhinorrhea.      Mouth/Throat:      Pharynx: Oropharynx is clear.   Eyes:      General: No scleral icterus.        Right eye: No discharge.         Left eye: No discharge.      Extraocular Movements: Extraocular movements intact.      Conjunctiva/sclera: Conjunctivae normal.   Cardiovascular:      Rate and Rhythm: Normal rate and regular rhythm.   Pulmonary:      Effort: Pulmonary effort is normal. No respiratory distress.      Breath sounds: Normal breath sounds. No stridor. No wheezing, rhonchi or rales.   Chest:      Chest wall: No tenderness.   Abdominal:      General: Abdomen is flat. There is no distension.      Palpations: Abdomen is soft.      Tenderness: There is no abdominal tenderness. There is no guarding or rebound.   Musculoskeletal:         General: No swelling, tenderness, deformity or signs of injury. Normal range of motion.      Cervical back: Normal range of motion and neck supple.      Right lower leg: No edema.      Left lower leg: No edema.   Skin:     General: Skin  is warm and dry.      Coloration: Skin is not jaundiced or pale.      Findings: No bruising, erythema, lesion or rash.   Neurological:  General: No focal deficit present.      Mental Status: He is alert and oriented to person, place, and time. Mental status is at baseline.      Cranial Nerves: No cranial nerve deficit.      Sensory: No sensory deficit.      Motor: No weakness.   Psychiatric:         Mood and Affect: Mood normal.         Behavior: Behavior normal.           Diagnostic Results:     LAB STUDIES    All lab value have been personally reviewed by me    Results       Procedure Component Value Units Date/Time    Urinalysis w Microscopic and Culture if Indicated [[id number]]  (Abnormal) Collected: 01/09/22 1338    Specimen: Urine, Random Updated: 01/09/22 1405     Color, UA Yellow     Clarity, UA Clear     Urine Specific Gravity 1.013     pH, Urine 5.0 pH      Protein, UR Negative mg/dL      Glucose, UA Negative mg/dL      Ketones UA Negative mg/dL      Bilirubin, UA Negative     Blood, UA Negative     Nitrite, UA Negative     Urobilinogen, UA Normal mg/dL      Leukocyte Esterase, UA 25 Leu/uL      UR Micro Performed     WBC, UA 6 /hpf      RBC, UA <1 /hpf      Squam Epithel, UA 5 /hpf     Narrative:      A Urine Culture has been ordered based upon the Positive UA results.    Troponin I [[id number]]  (Abnormal) Collected: 01/09/22 1114    Specimen: Plasma Updated: 01/09/22 1206     Troponin I 0.08 ng/mL     Comprehensive metabolic panel [[id number]]  (Abnormal) Collected: 01/09/22 1114    Specimen: Plasma Updated: 01/09/22 1153     Sodium 136 mMol/L      Potassium 3.9 mMol/L      Chloride 105 mMol/L      CO2 21 mMol/L      Calcium 9.9 mg/dL      Glucose 664 mg/dL      Creatinine 4.03 mg/dL      BUN 16 mg/dL      Protein, Total 7.0 gm/dL      Albumin 4.2 gm/dL      Alkaline Phosphatase 71 U/L      ALT 20 U/L      AST (SGOT) 18 U/L      Bilirubin, Total 1.4 mg/dL      Albumin/Globulin Ratio 1.50 Ratio       Anion Gap 13.9 mMol/L      BUN / Creatinine Ratio 14.7 Ratio      EGFR 71 mL/min/1.40m2      Osmolality Calculated 276 mOsm/kg      Globulin 2.8 gm/dL     CBC and differential [[id number]] Collected: 01/09/22 1114    Specimen: Blood Updated: 01/09/22 1141     WBC 6.2 K/cmm      RBC 4.84 M/cmm      Hemoglobin 14.3 gm/dL      Hematocrit 87.5 %      MCV 92 fL      MCH 30 pg      MCHC 32 gm/dL  RDW 12.8 %      PLT CT 246 K/cmm      MPV 8.0 fL      Neutrophils % 73.7 %      Lymphocytes 19.8 %      Monocytes 5.5 %      Eosinophils % 0.8 %      Basophils % 0.2 %      Neutrophils Absolute 4.6 K/cmm      Lymphocytes Absolute 1.2 K/cmm      Monocytes Absolute 0.3 K/cmm      Eosinophils Absolute 0.0 K/cmm      Basophils Absolute 0.0 K/cmm     Collect Blood [[id number]] Collected: 01/09/22 1114    Specimen: Other Updated: 01/09/22 1121     Collect Blood Label Notification            RADIOLOGIC STUDIES    All images have been personally viewed by me    No results found.      EKG:       EKG:   Last EKG Result       Procedure Component Value Units Date/Time    ECG 12 lead [[id number]] Collected: 01/09/22 1055     Updated: 01/09/22 1456     Patient Age 65 years      Patient DOB 07/26/1946     Patient Height --     Patient Weight --     Interpretation Text --     Sinus rhythm, rate 81, PR 166, QTc 429, no STEMI  Ventricular premature complex  Low voltage, precordial leads  RSR' in V1 or V2, right VCD or RVH  Compared to ECG 12/23/2021 19:20:33  Ventricular premature complex(es) now present  T-wave abnormality no longer present  Electronically Signed On 01-09-2022 14:55:01 EDT by Dow Adolph       Physician Interpreter Lonisha Bobby     Ventricular Rate 81 //min      QRS Duration 97 ms      P-R Interval 166 ms      Q-T Interval 369 ms      Q-T Interval(Corrected) 429 ms      P Wave Axis -26 deg      QRS Axis 2 deg      T Axis 37 years              PROCEDURES       Procedures        ORDERS PLACED THIS VISIT     Orders  Orders Placed  This Encounter   Procedures    Urine Culture    XR Chest AP Portable    CBC and differential    Comprehensive metabolic panel    Troponin I    Urinalysis w Microscopic and Culture if Indicated    Collect Blood    Troponin I    Orthostatic blood pressure    Pulse Oximetry    Cardiac Monitoring (Hard Wire)    Inpatient consult to cardiology    ECG 12 lead    Saline lock IV    ED Admission Request       Medications  Medications   aspirin chewable tablet 324 mg (has no administration in time range)              Allergies & Medications:     Pt is allergic to fentanyl.    Current/Home Medications    ASPIRIN 81 MG CHEWABLE TABLET    Chew 81 mg by mouth daily.  ATORVASTATIN (LIPITOR) 40 MG TABLET    Take 1 tablet (40 mg) by mouth daily    CHOLECALCIFEROL (VITAMIN D) 1000 UNIT TABLET    Take 1,000 Units by mouth daily.    FINASTERIDE (PROPECIA) 1 MG TABLET    Take 5 mg by mouth daily       LISINOPRIL (ZESTRIL) 5 MG TABLET    Take 5 mg by mouth    METFORMIN (GLUCOPHAGE) 500 MG TABLET    Take 1 tablet (500 mg total) by mouth 2 (two) times daily with meals    METOPROLOL SUCCINATE XL (TOPROL-XL) 25 MG 24 HR TABLET    Take 25 mg by mouth daily    TAMSULOSIN (FLOMAX) 0.4 MG CAP    Take 0.4 mg by mouth Daily after dinner    UNKNOWN TO PATIENT               Past History:     Medical:   Past Medical History:   Diagnosis Date    Coronary artery disease     s/p CABG    Enlarged prostate     Hyperlipidemia     Hypertension     Neurogenic amyotrophy     Neuropathy     Type 2 diabetes mellitus, controlled        Surgical:   Past Surgical History:   Procedure Laterality Date    CARDIAC SURGERY      bypass x6    COLONOSCOPY N/A 01/09/2015    Procedure: COLONOSCOPY;  Surgeon: Chauncey Fischer, MD;  Location: [facility];  Service: Gastroenterology;  Laterality: N/A;    COLONOSCOPY N/A 08/09/2018    Procedure: COLONOSCOPY;  Surgeon: Chauncey Fischer, MD;  Location: [facility];  Service: Gastroenterology;  Laterality: N/A;     CORONARY ARTERY BYPASS GRAFT      left elbow      TONSILLECTOMY         Family:   Family History   Problem Relation Age of Onset    No known problems Mother     No known problems Father        Social:  reports that he has never smoked. He has never used smokeless tobacco. He reports that he does not drink alcohol and does not use drugs.        ATTESTATIONS     Dow Adolph, MD    The results of diagnostic studies have been reviewed by myself. The above past medical, family, social, and surgical histories have been reviewed by myself. The clinical impression and plan have been discussed with the patient and/or the patient's family. All questions have been answered.    Note:  This chart was generated by an EMR and may contain errors, including typographical, or omissions not intended by the user. This chart was generated by the Epic EMR system/speech recognition and may contain inherent errors or omissions not intended by the user. Grammatical errors, random word insertions, deletions, pronoun errors and incomplete sentences are occasional consequences of this technology due to software limitations. Not all errors are caught or corrected. If there are questions or concerns about the content of this note or information contained within the body of this dictation they should be addressed directly with the Thereasa Parkin for clarification            Dow Adolph, MD  01/09/22 1512

## 2022-01-09 NOTE — UM Notes (Signed)
[facility] Utilization Management Review Sheet    Facility :  [facility]    NAME: Duane Thomas      MR#: [id number]    ROOM: 2506/2506-A     Date of Birth: 1946/05/24    ADMIT DATE AND TIME: 01/09/2022 12:48 PM    ATTENDING PHYSICIAN: Myna Bright, MD      PAYOR:Payor: Cleatrice Burke / Plan: [employer] / Product Type: COMMERCIAL /     AUTH #:     DATE OF REVIEW COMPLETION: 01/09/2022    DATE REVIEWED: 01/09/2022    Chief Complaint/ED Presentation:  high BP, fatigue    Pertinent Medical History:   Past Medical History:   Diagnosis Date    Coronary artery disease     s/p CABG    Enlarged prostate     Hyperlipidemia     Hypertension     Neurogenic amyotrophy     Neuropathy     Type 2 diabetes mellitus, controlled        Vitals:   01/09/22 1720 -- -- -- -- -- -- -- -- -- -- -- -- -- 0 JMZ   01/09/22 1713 -- 97.3 F (36.3 C) Temporal 67 97 % -- 17 183/83 Right arm -- 116 (Abnormal)   Lying -- 0 ELS   01/09/22 1544 -- 97.3 F (36.3 C) -- 63 -- -- 21 166/76 Right arm Automatic -- Sitting -- -- SHT   01/09/22 1350 -- -- -- -- -- -- --  141/73 Right arm -- -- Lying -- -- SHT   01/09/22 1257 -- 97.8 F (36.6 C) Oral 63 -- Monitor 22 153/83 Right arm Automatic -- Sitting -- 0 SHT   01/09/22 1049 -- 98.2 F (36.8 C) Oral 82 97 % -- 18 169/88 -- -- 106 -- -- 0 JL       Abnl/Pertinent Labs/Radiology/Diagnostic Studies:   Trop 0.07/0.08    CXR  Persistent elevation of the right hemidiaphragm, likely from paralysis.    Minimal subsegmental atelectasis in the right lung base.   No acute process in the left.     ECG  Sinus rhythm, rate 81, PR 166, QTc 429, no STEMI   Ventricular premature complex   Low voltage, precordial leads   RSR' in V1 or V2, right VCD or RVH   Compared to ECG 12/23/2021 19:20:33   Ventricular premature complex(es) now present   T-wave abnormality no longer present     ED treatment:   PO ASA     Admission Diagnosis: elevated trop     Physical Exam:   HENT:       Head: Normocephalic and atraumatic.   Eyes:      Conjunctiva/sclera: Conjunctivae normal.   Cardiovascular:      Rate and Rhythm: Normal rate and regular rhythm.      Heart sounds: Normal heart sounds.   Pulmonary:      Effort: Pulmonary effort is normal. No respiratory distress.      Breath sounds: Normal breath sounds.   Abdominal:      Palpations: Abdomen is soft.      Tenderness: There is no abdominal tenderness.   Musculoskeletal:         General: Normal range of motion.      Cervical back: Normal range of motion and neck supple.   Skin:     General: Skin is warm and dry.   Neurological:      Mental Status: He  is alert and oriented to person, place, and time.      Gait: Gait is intact.      Comments: There is no facial droop or asymmetry.  The patient symmetrical grip strength.  He has no focal deficits.   Psychiatric:         Mood and Affect: Mood normal.         Behavior: Behavior normal.     MD Consults/Assessments & Plans:   Elevated serum troponin -the patient will be placed on telemetry monitoring.  We will obtain serial cardiac enzymes.  If the troponin continues to climb we will consult cardiology.  Elevated troponin could be secondary to hypertension.     Hypertensive urgency -blood pressure control as needed.    Medications:    Scheduled Meds:  Current Facility-Administered Medications   Medication Dose Route Frequency    [START ON 01/10/2022] aspirin  81 mg Oral Daily    atorvastatin  40 mg Oral Daily    doxycycline  100 mg Oral BID    [START ON 01/10/2022] finasteride  5 mg Oral Daily    insulin lispro (1 Unit Dial)  1-9 Units Subcutaneous TID AC    And    insulin lispro (1 Unit Dial)  1-7 Units Subcutaneous QHS    [START ON 01/10/2022] lisinopril  5 mg Oral Daily    metFORMIN  500 mg Oral BID Meals    [START ON 01/10/2022] metoprolol succinate XL  25 mg Oral Daily    sodium chloride (PF)  3 mL Intravenous Q12H SCH    [START ON 01/10/2022] tamsulosin  0.4 mg Oral Daily     Orders:   Cardiology consult    Labs in am   Tele   VS every 4 hours       MCG Criteria: MG-C    Andreas Blower, BSN, Pharmacist, [facility]   Phone:  [phone]  Fax:  [phone]

## 2022-01-09 NOTE — Plan of Care (Signed)
Problem: Safety  Goal: Patient will be free from injury during hospitalization  Outcome: Progressing  Flowsheets (Taken 01/09/2022 1750 by Robina Ade, RN)  Patient will be free from injury during hospitalization:   Assess patient's risk for falls and implement fall prevention plan of care per policy   Provide and maintain safe environment   Use appropriate transfer methods   Ensure appropriate safety devices are available at the bedside   Include patient/ family/ care giver in decisions related to safety   Hourly rounding   Assess for patients risk for elopement and implement Elopement Risk Plan per policy   Provide alternative method of communication if needed (communication boards, writing)     Problem: Inadequate Tissue Perfusion  Goal: Adequate tissue perfusion will be maintained  Outcome: Progressing  Flowsheets (Taken 01/09/2022 1750 by Zerfoss, Anderson Malta, RN)  Adequate tissue perfusion will be maintained:   Monitor/assess vital signs   Monitor/assess lab values and report abnormal values   Monitor/assess neurovascular status (pulses, capillary refill, pain, paresthesia, paralysis, presence of edema)   VTE Prevention: Administer anticoagulant(s) and/or apply anti-embolism stockings/devices as ordered   Monitor for signs and symptoms of a pulmonary embolism (dyspnea, tachypnea, tachycardia, confusion)   Elevate feet   Increase mobility as tolerated/progressive mobility   Place shoes or other foot protection on patient   Assess and monitor skin integrity   Provide wound/skin care   Position patient for maximum circulation/cardiac output     Problem: Ineffective Gas Exchange  Goal: Effective breathing pattern  Outcome: Progressing  Flowsheets (Taken 01/09/2022 1750 by Robina Ade, RN)  Effective breathing pattern:   Maintain O2 saturation level per LIP order   Monitor for sleep apnea   Monitor for medication induced respiratory depression

## 2022-01-09 NOTE — H&P (Signed)
[facility]  OBSERVATION UNIT  HISTORY AND PHYSICAL       Patient: Duane Thomas  Admission Date: 01/09/2022    DOB: 1945-12-07  Age: 75 y.o.    MRN: [id number]  Sex: male    PCP: Pcp, None, MD  Attending: Dow Adolph, MD         HISTORY OF PRESENT ILLNESS     Duane Thomas is a 76 y.o. male who presented with high blood pressure and elevated troponin.  The patient states that he has had no recent changes to his blood pressure medication.  This morning he checked his blood pressure and it was over 200 systolic.  He presented to his cardiologist office but there was no one available to see him.  They recommended that he come to the emergency room.  The patient denies any chest pain or shortness of breath.  He has had some mild fatigue.  He does have a known history of coronary disease with previous CABG.  He denies leg swelling.  He denies pain anywhere.  He otherwise feels well.  He denies headache or dizziness.  He has no other complaints.  PAST MEDICAL HISTORY     Medical: Pt has a past medical history of Coronary artery disease, Enlarged prostate, Hyperlipidemia, Hypertension, Neurogenic amyotrophy, Neuropathy, and Type 2 diabetes mellitus, controlled.    Surgical: Pt  has a past surgical history that includes Tonsillectomy; left elbow; Colonoscopy (N/A, 01/09/2015); Cardiac surgery; Colonoscopy (N/A, 08/09/2018); and Coronary artery bypass graft.    Family: The family history includes No known problems in his father and mother.    Social: Pt reports that he has never smoked. He has never used smokeless tobacco. He reports that he does not drink alcohol and does not use drugs.    Reported home medications: aspirin, atorvastatin, doxycycline, finasteride, lisinopril, metFORMIN, metoprolol succinate XL, tamsulosin, and vitamin D    Pt is allergic to fentanyl.  REVIEW OF SYSTEMS   Review of Systems   Constitutional:  Negative for chills and fever.   HENT:  Negative for congestion and sore  throat.    Eyes: Negative.    Respiratory:  Negative for cough and shortness of breath.    Cardiovascular:  Negative for chest pain.   Gastrointestinal:  Negative for abdominal pain, diarrhea and vomiting.   Musculoskeletal:  Negative for back pain and neck pain.   Skin:  Negative for rash.   Neurological: Negative.  Negative for dizziness and headaches.   Psychiatric/Behavioral: Negative.       PHYSICAL EXAM     Vital Signs: Blood pressure 166/76, pulse 63, temperature 97.3 F (36.3 C), resp. rate 21, weight 89 kg (196 lb 3.4 oz), SpO2 97 %.    Physical Exam  Vitals and nursing note reviewed.   HENT:      Head: Normocephalic and atraumatic.   Eyes:      Conjunctiva/sclera: Conjunctivae normal.   Cardiovascular:      Rate and Rhythm: Normal rate and regular rhythm.      Heart sounds: Normal heart sounds.   Pulmonary:      Effort: Pulmonary effort is normal. No respiratory distress.      Breath sounds: Normal breath sounds.   Abdominal:      Palpations: Abdomen is soft.      Tenderness: There is no abdominal tenderness.   Musculoskeletal:         General: Normal range of motion.      Cervical  back: Normal range of motion and neck supple.   Skin:     General: Skin is warm and dry.   Neurological:      Mental Status: He is alert and oriented to person, place, and time.      Gait: Gait is intact.      Comments: There is no facial droop or asymmetry.  The patient symmetrical grip strength.  He has no focal deficits.   Psychiatric:         Mood and Affect: Mood normal.         Behavior: Behavior normal.        LABS & IMAGING       Labs  Results       Procedure Component Value Units Date/Time    Troponin I [[id number]] Collected: 01/09/22 1538    Specimen: Plasma Updated: 01/09/22 1545    Urinalysis w Microscopic and Culture if Indicated [[id number]]  (Abnormal) Collected: 01/09/22 1338    Specimen: Urine, Random Updated: 01/09/22 1405     Color, UA Yellow     Clarity, UA Clear     Urine Specific Gravity 1.013     pH, Urine  5.0 pH      Protein, UR Negative mg/dL      Glucose, UA Negative mg/dL      Ketones UA Negative mg/dL      Bilirubin, UA Negative     Blood, UA Negative     Nitrite, UA Negative     Urobilinogen, UA Normal mg/dL      Leukocyte Esterase, UA 25 Leu/uL      UR Micro Performed     WBC, UA 6 /hpf      RBC, UA <1 /hpf      Squam Epithel, UA 5 /hpf     Narrative:      A Urine Culture has been ordered based upon the Positive UA results.    Troponin I [[id number]]  (Abnormal) Collected: 01/09/22 1114    Specimen: Plasma Updated: 01/09/22 1206     Troponin I 0.08 ng/mL     Comprehensive metabolic panel [[id number]]  (Abnormal) Collected: 01/09/22 1114    Specimen: Plasma Updated: 01/09/22 1153     Sodium 136 mMol/L      Potassium 3.9 mMol/L      Chloride 105 mMol/L      CO2 21 mMol/L      Calcium 9.9 mg/dL      Glucose 244 mg/dL      Creatinine 0.10 mg/dL      BUN 16 mg/dL      Protein, Total 7.0 gm/dL      Albumin 4.2 gm/dL      Alkaline Phosphatase 71 U/L      ALT 20 U/L      AST (SGOT) 18 U/L      Bilirubin, Total 1.4 mg/dL      Albumin/Globulin Ratio 1.50 Ratio      Anion Gap 13.9 mMol/L      BUN / Creatinine Ratio 14.7 Ratio      EGFR 71 mL/min/1.9m2      Osmolality Calculated 276 mOsm/kg      Globulin 2.8 gm/dL     CBC and differential [[id number]] Collected: 01/09/22 1114    Specimen: Blood Updated: 01/09/22 1141     WBC 6.2 K/cmm      RBC 4.84 M/cmm      Hemoglobin 14.3 gm/dL      Hematocrit 03.4 %  MCV 92 fL      MCH 30 pg      MCHC 32 gm/dL      RDW 16.1 %      PLT CT 246 K/cmm      MPV 8.0 fL      Neutrophils % 73.7 %      Lymphocytes 19.8 %      Monocytes 5.5 %      Eosinophils % 0.8 %      Basophils % 0.2 %      Neutrophils Absolute 4.6 K/cmm      Lymphocytes Absolute 1.2 K/cmm      Monocytes Absolute 0.3 K/cmm      Eosinophils Absolute 0.0 K/cmm      Basophils Absolute 0.0 K/cmm     Collect Blood [[id number]] Collected: 01/09/22 1114    Specimen: Other Updated: 01/09/22 1121     Collect Blood Label Notification             Radiologic Studies  XR Chest AP Portable    Result Date: 01/09/2022  FINDINGS/IMPRESSION: Persistent elevation of the right hemidiaphragm, likely from paralysis.  Minimal subsegmental atelectasis in the right lung base. No acute process in the left. ReadingStation:[id number]      EKG Results       Procedure Component Value Units Date/Time    ECG 12 lead [[id number]] Collected: 01/09/22 1055     Updated: 01/09/22 1456     Patient Age 89 years      Patient DOB 12-Nov-1945     Patient Height --     Patient Weight --     Interpretation Text --     Sinus rhythm, rate 81, PR 166, QTc 429, no STEMI  Ventricular premature complex  Low voltage, precordial leads  RSR' in V1 or V2, right VCD or RVH  Compared to ECG 12/23/2021 19:20:33  Ventricular premature complex(es) now present  T-wave abnormality no longer present  Electronically Signed On 01-09-2022 14:55:01 EDT by Dow Adolph       Physician Interpreter Navid Behrooz     Ventricular Rate 81 //min      QRS Duration 97 ms      P-R Interval 166 ms      Q-T Interval 369 ms      Q-T Interval(Corrected) 429 ms      P Wave Axis -26 deg      QRS Axis 2 deg      T Axis 37 years            EMERGENCY DEPARTMENT COURSE     Medications   aspirin chewable tablet 324 mg (324 mg Oral Given 01/09/22 1544)      The patient remained stable during his course in the emergency room.  His blood pressure improved to the 160s over 80s without treatment.  He is having no chest pain.  EKG showed no significant ischemic changes.  Patient however did have an elevated troponin of 0.08.  We are asked to evaluate the patient for admission for serial troponin and cardiology involvement if necessary.    ED documentation including nurses notes were reviewed by myself.  The case was discussed with the ED Attending.    ASSESSMENT & PLAN     Herndon Grill is a 76 y.o. male admitted under OBSERVATION with:    Elevated serum troponin -the patient will be placed on telemetry monitoring.  We will  obtain serial cardiac enzymes.  If the troponin continues to climb we  will consult cardiology.  Elevated troponin could be secondary to hypertension.    Hypertensive urgency -blood pressure control as needed.                Code Status: Full Code      I certify the need for admission to the Observation Unit based on the patient's history and the information above.    Myna Bright, MD   01/09/2022 4:27 PM

## 2022-01-09 NOTE — ED Notes (Addendum)
[facility] EMERGENCY DEPARTMENT  ED NURSING NOTE FOR THE RECEIVING INPATIENT NURSE   ED NURSE PHONE: [phone]    ADMISSION INFORMATION   Patient woke up this AM not feeling right, fatigued. Took BP- SBP 180s. Went to cardiologist office this AM who referred him to the ED. Elevated trop (0.08, 0.07), admit to Obs.                  Patient Comes From: Home Independent   Documents accompanying patient: not applicable and     Mental Status: alert and oriented   Ambulation: no difficulty   Pertinent Info/Safety Concern: None   Report called to receiving RN?: Yes    ED nurse will obtain a full set of vital signs including temperature and document prior to patient departure

## 2022-01-09 NOTE — Telephone Encounter (Signed)
Pt calling in to let Dr. [name] know he is in the hospital with elevated trop. Please review and advise

## 2022-01-10 ENCOUNTER — Telehealth: Payer: Self-pay

## 2022-01-10 LAB — VH DEXTROSE STICK GLUCOSE
Glucose POCT: 132 mg/dL — ABNORMAL HIGH (ref 71–99)
Glucose POCT: 135 mg/dL — ABNORMAL HIGH (ref 71–99)

## 2022-01-10 MED ORDER — LISINOPRIL 20 MG PO TABS
20.0000 mg | ORAL_TABLET | Freq: Every day | ORAL | 0 refills | Status: DC
Start: 2022-01-10 — End: 2022-01-15

## 2022-01-10 NOTE — Telephone Encounter (Signed)
Please see patient message.     Thanks,  TEllett, CNA

## 2022-01-10 NOTE — Consults (Addendum)
Quick Doc  [facility] - CLINICAL OBSERVATION UNIT   Patient Name: Duane Thomas   Attending Physician: Myna Bright, MD   Today's date:   01/10/2022 LOS: 0 days   Expected Discharge Date      Quick  Assessment:                                                                   CM Comments: (01/10/22) DPSS MA  Consult placed to assist patient with referrals/resources due to reported financial issues and possible mental health concerns. DPSS presented to bedside to speak with the patient regarding these concerns and the patient denies having any financial issues or needs to speak with someone. Patient goes on to state that 'mental health workers in this [place] are making everyone weak.' Due to this, no referrals or resources on menalth health counseling/financial counseling were given. DPSS discussed the patient's need for a PCP, as one is not listed in his chart. The patient did agree to a transition clinic referral, however due to the patient having [employer] insurance, the transition clinic states that they would be unable to take the patient unless he private pays for services. DPSS informed the patient of this issue and no appointment was made. Please re-consult if any new needs arise prior to discharge.                                                                                       Provider Notifications:        Harlow Asa, M.S.   Discharge Planner, Social [facility] Management Department  [facility]  [address]   [place], [place] [address]  [phone]  [email or web address]

## 2022-01-10 NOTE — Plan of Care (Signed)
Problem: Safety  Goal: Patient will be free from injury during hospitalization  Outcome: Progressing  Flowsheets (Taken 01/09/2022 1750 by Robina Ade, RN)  Patient will be free from injury during hospitalization:   Assess patient's risk for falls and implement fall prevention plan of care per policy   Provide and maintain safe environment   Use appropriate transfer methods   Ensure appropriate safety devices are available at the bedside   Include patient/ family/ care giver in decisions related to safety   Hourly rounding   Assess for patients risk for elopement and implement Elopement Risk Plan per policy   Provide alternative method of communication if needed (communication boards, writing)     Problem: Inadequate Tissue Perfusion  Goal: Adequate tissue perfusion will be maintained  Outcome: Progressing  Flowsheets (Taken 01/09/2022 1750 by Zerfoss, Anderson Malta, RN)  Adequate tissue perfusion will be maintained:   Monitor/assess vital signs   Monitor/assess lab values and report abnormal values   Monitor/assess neurovascular status (pulses, capillary refill, pain, paresthesia, paralysis, presence of edema)   VTE Prevention: Administer anticoagulant(s) and/or apply anti-embolism stockings/devices as ordered   Monitor for signs and symptoms of a pulmonary embolism (dyspnea, tachypnea, tachycardia, confusion)   Elevate feet   Increase mobility as tolerated/progressive mobility   Place shoes or other foot protection on patient   Assess and monitor skin integrity   Provide wound/skin care   Position patient for maximum circulation/cardiac output     Problem: Ineffective Gas Exchange  Goal: Effective breathing pattern  Outcome: Progressing  Flowsheets (Taken 01/09/2022 1750 by Robina Ade, RN)  Effective breathing pattern:   Maintain O2 saturation level per LIP order   Monitor for sleep apnea   Monitor for medication induced respiratory depression     Problem: Moderate/High Fall Risk Score >5  Goal: Patient  will remain free of falls  Outcome: Progressing  Flowsheets (Taken 01/10/2022 0900)  VH Moderate Risk (6-13):   ALL REQUIRED LOW INTERVENTIONS   INITIATE YELLOW "FALL RISK" SIGNAGE   YELLOW NON-SKID SLIPPERS   YELLOW "FALL RISK" ARM BAND   USE OF BED EXIT ALARM IF PATIENT IS CONFUSED OR IMPULSIVE. PLACE RESET BED ALARM SIGN ABOVE BED   PLACE FALL RISK LEVEL ON WHITE BOARD FOR COMMUNICATION PURPOSES IN PATIENT'S ROOM

## 2022-01-10 NOTE — Discharge Instr - AVS First Page (Signed)
You are now taking lisinopril 20 mg once a day instead of 5 mg once a day.    Please follow-up with your primary care physician in 1 to 2 weeks.    Please follow-up with your cardiologist as soon as possible.    Please return to the emergency department for worsening or new symptoms especially if your blood pressure is greater than 180/110

## 2022-01-10 NOTE — Progress Notes (Signed)
Discharge instructions provided to patient. Verbalizes understanding. IV removed. Will order transport.

## 2022-01-10 NOTE — Telephone Encounter (Signed)
Pt LVM to let Dr. [name] know he was just discharged from the hospital for elevated blood pressures. Called and spoke with pt and he says they increased his lisinopril to 20 mg daily. He has a hospital f/u scheduled with [facility] in June. Please contact if you need anything else from him at this time.     [phone]

## 2022-01-11 ENCOUNTER — Encounter: Payer: Self-pay | Admitting: Cardiology

## 2022-01-11 ENCOUNTER — Encounter: Payer: Self-pay | Admitting: Cardiovascular Disease

## 2022-01-11 LAB — VH CULTURE, URINE: Culture Result: NO GROWTH

## 2022-01-12 ENCOUNTER — Encounter: Payer: Self-pay | Admitting: Cardiovascular Disease

## 2022-01-13 NOTE — Telephone Encounter (Signed)
Please see patient update on BP.    Thanks,  TEllett, CNA

## 2022-01-13 NOTE — Telephone Encounter (Signed)
Please see patient's follow up message.     Thanks,  TEllett, CNA

## 2022-01-14 ENCOUNTER — Encounter: Payer: Self-pay | Admitting: Dermatology

## 2022-01-15 ENCOUNTER — Emergency Department
Admission: EM | Admit: 2022-01-15 | Discharge: 2022-01-15 | Disposition: A | Discharge status: Home / Self Care | Payer: Non-veteran care | Attending: Physician Assistant | Admitting: Physician Assistant

## 2022-01-15 ENCOUNTER — Emergency Department: Payer: Non-veteran care

## 2022-01-15 DIAGNOSIS — Z79899 Other long term (current) drug therapy: Secondary | ICD-10-CM | POA: Insufficient documentation

## 2022-01-15 DIAGNOSIS — Z951 Presence of aortocoronary bypass graft: Secondary | ICD-10-CM | POA: Insufficient documentation

## 2022-01-15 DIAGNOSIS — I2510 Atherosclerotic heart disease of native coronary artery without angina pectoris: Secondary | ICD-10-CM | POA: Insufficient documentation

## 2022-01-15 DIAGNOSIS — I10 Essential (primary) hypertension: Secondary | ICD-10-CM | POA: Insufficient documentation

## 2022-01-15 DIAGNOSIS — R778 Other specified abnormalities of plasma proteins: Secondary | ICD-10-CM | POA: Insufficient documentation

## 2022-01-15 DIAGNOSIS — R0789 Other chest pain: Secondary | ICD-10-CM | POA: Insufficient documentation

## 2022-01-15 DIAGNOSIS — E119 Type 2 diabetes mellitus without complications: Secondary | ICD-10-CM | POA: Insufficient documentation

## 2022-01-15 DIAGNOSIS — E785 Hyperlipidemia, unspecified: Secondary | ICD-10-CM | POA: Insufficient documentation

## 2022-01-15 LAB — COMPREHENSIVE METABOLIC PANEL
ALT: 21 U/L (ref 0–55)
AST (SGOT): 19 U/L (ref 10–42)
Albumin/Globulin Ratio: 1.45 Ratio (ref 0.80–2.00)
Albumin: 4.5 gm/dL (ref 3.5–5.0)
Alkaline Phosphatase: 66 U/L (ref 40–145)
Anion Gap: 14.9 mMol/L (ref 7.0–18.0)
BUN / Creatinine Ratio: 15.6 Ratio (ref 10.0–30.0)
BUN: 17 mg/dL (ref 7–22)
Bilirubin, Total: 1.2 mg/dL (ref 0.1–1.2)
CO2: 21 mMol/L (ref 20–30)
Calcium: 10.1 mg/dL (ref 8.5–10.5)
Chloride: 108 mMol/L (ref 98–110)
Creatinine: 1.09 mg/dL (ref 0.80–1.30)
EGFR: 71 mL/min/{1.73_m2} (ref 60–150)
Globulin: 3.1 gm/dL (ref 2.0–4.0)
Glucose: 161 mg/dL — ABNORMAL HIGH (ref 71–99)
Osmolality Calculated: 284 mOsm/kg (ref 275–300)
Potassium: 3.9 mMol/L (ref 3.5–5.3)
Protein, Total: 7.6 gm/dL (ref 6.0–8.3)
Sodium: 140 mMol/L (ref 136–147)

## 2022-01-15 LAB — CBC AND DIFFERENTIAL
Basophils %: 0.7 % (ref 0.0–3.0)
Basophils Absolute: 0 10*3/uL (ref 0.0–0.3)
Eosinophils %: 0.5 % (ref 0.0–7.0)
Eosinophils Absolute: 0 10*3/uL (ref 0.0–0.8)
Hematocrit: 47.4 % (ref 39.0–52.5)
Hemoglobin: 15.9 gm/dL (ref 13.0–17.5)
Lymphocytes Absolute: 1.5 10*3/uL (ref 0.6–5.1)
Lymphocytes: 25.8 % (ref 15.0–46.0)
MCH: 31 pg (ref 28–35)
MCHC: 33 gm/dL (ref 32–36)
MCV: 94 fL (ref 80–100)
MPV: 8.4 fL (ref 6.0–10.0)
Monocytes Absolute: 0.3 10*3/uL (ref 0.1–1.7)
Monocytes: 5.2 % (ref 3.0–15.0)
Neutrophils %: 67.8 % (ref 42.0–78.0)
Neutrophils Absolute: 4 10*3/uL (ref 1.7–8.6)
PLT CT: 252 10*3/uL (ref 130–440)
RBC: 5.07 10*6/uL (ref 4.00–5.70)
RDW: 12.8 % (ref 11.0–14.0)
WBC: 5.8 10*3/uL (ref 4.0–11.0)

## 2022-01-15 LAB — TROPONIN I
Troponin I: 0.08 ng/mL — ABNORMAL HIGH (ref 0.00–0.02)
Troponin I: 0.07 ng/mL — ABNORMAL HIGH (ref 0.00–0.02)

## 2022-01-15 LAB — VH EXTRA SPECIMEN LABEL

## 2022-01-15 MED ORDER — LISINOPRIL 40 MG PO TABS
40.0000 mg | ORAL_TABLET | Freq: Every day | ORAL | 0 refills | Status: DC
Start: 2022-01-15 — End: 2022-01-23

## 2022-01-15 NOTE — ED Provider Notes (Signed)
[facility]  EMERGENCY DEPARTMENT  History and Physical Exam     Patient Name: Duane Thomas, Duane Thomas  Encounter Date:  01/15/2022  Attending Physician: Skip Estimable, MD  Room:  RAUE/RAU-E  Patient DOB:  31-Dec-1945  Age: 76 y.o. male  MRN:  [id number]  PCP: Pcp, None, MD    ----------------------------------------------------------------------------------------------------------------------    IMPRESSION, MEDICAL DECISION MAKING, AND DISPOSITION    Final Impression  1. Hypertension, unspecified type    2. Elevated troponin    3. S/P CABG (coronary artery bypass graft)    4. Coronary artery disease involving native heart without angina pectoris, unspecified vessel or lesion type        Diagnostic considerations include but are not limited to hypertension, hypertensive urgency, hypertensive emergency, ACS    Patient was evaluated in this emergency department for hypertension with an elevated troponin with a history of CAD status status post CABG.  He was well-appearing in no acute distress during his stay in the emergency department.  He denied any chest pain or shortness of breath.  He had initial blood pressure upon presentation at 172/86 which came down to 154/76 with no intervention.  He had a troponin of 0.08 which came down to 0.07 upon recheck.  He had an EKG that showed no acute ischemic changes.  I discussed the findings with the patient.  I discussed the patient with his cardiologist Dr. [name].  We agreed upon a plan where if patient's troponin improved during the emergency department that he can go home and she recommended doubling his lisinopril from 20 mg to 40 mg with follow-up in her clinic.  As his troponin did go down I do feel that discharge is a reasonable plan as patient was recently admitted for an elevated troponin and it was trending downwards during observation.  I will double his lisinopril from 20 mg to 40 mg.  I recommended follow-up with Dr. Radford Pax office or return to  this emergency department should his symptoms worsen.  I recommended that he discontinue taking his blood pressure every hour.  Patient voiced understanding and agreement with this plan.  Discussed return precautions.    Disposition  ED Disposition       ED Disposition   Discharge    Condition   --    Date/Time   Wed Jan 15, 2022  5:39 PM    Comment   Caprice Wasko discharge to home/self care.    Condition at disposition: Stable               Follow up  Rosalyn Gess, MD  [address]  [place] [address]  [phone]    Call       [facility] Emergency Department  [address]  [place] [address]  [phone]    As needed    Prescriptions  Discharge Medication List as of 01/15/2022  5:48 PM          ----------------------------------------------------------------------------------------------------------------------    Subjective     CHIEF COMPLAINT  Hypertension      TRIAGE NOTE       EXTERNAL RECORDS REVIEWED   discharge summary from observation stay from May 11 to 12 were patient's troponin was trended downward and he presented for this same complaint.    HPI/ROS     Limitation to History  None    Outside Historians  None    HPI     Duane Thomas is a 76 y.o. male  who presents with concern for elevated blood pressure and chest tightness.  He denies any shortness of breath.  Denies any objective pain.  He denies any nausea.  He is attributing his elevated blood pressure to an increase in his atorvastatin.  Patient had a recent observation stay of the night of May 11 and was discharged May 12 for the same.  He had serial troponins that trended down from 0.08 2.06.  Cardiology was consulted and does not feel that his abnormal troponin represents ACS.    PAST HISTORY  Medical: Pt has a past medical history of Coronary artery disease, Enlarged prostate, Hyperlipidemia, Hypertension, Neurogenic amyotrophy, Neuropathy, Self-catheterizes urinary bladder, and Type 2 diabetes mellitus,  controlled.  Surgical: Pt  has a past surgical history that includes Tonsillectomy; left elbow; Colonoscopy (N/A, 01/09/2015); Cardiac surgery; Colonoscopy (N/A, 08/09/2018); and Coronary artery bypass graft.  Family: The family history includes No known problems in his father and mother.  Social: Pt reports that he has never smoked. He has never used smokeless tobacco. He reports that he does not drink alcohol and does not use drugs.    ALLERGIES  Patient is allergic to fentanyl.    CURRENT MEDICATIONS  aspirin, atorvastatin, doxycycline, finasteride, lisinopril, metFORMIN, metoprolol succinate XL, tamsulosin, and vitamin D    REVIEW OF SYSTEMS     Review of Systems   Constitutional:  Negative for chills, diaphoresis and fever.   HENT:  Negative for congestion and sore throat.    Respiratory:  Negative for cough, chest tightness and shortness of breath.    Cardiovascular:  Negative for chest pain.   Gastrointestinal:  Negative for abdominal pain, constipation, diarrhea, nausea and vomiting.   Genitourinary:  Negative for dysuria and frequency.   Musculoskeletal:  Negative for myalgias.   Skin:  Negative for rash.   Neurological:  Negative for dizziness, syncope, weakness, light-headedness and headaches.   All other systems reviewed and are negative.         ----------------------------------------------------------------------------------------------------------------------    Objective   PHYSICAL EXAM   Blood pressure 154/76, pulse 61, temperature 97.6 F (36.4 C), temperature source Temporal, resp. rate 15, weight 89 kg, SpO2 98 %.    Constitutional: WD/WN, active, NAD     Eyes: PERRL.  Sclera anicteric, no discharge.  Visual acuity grossly intact.      HENT  Head:  NCAT.   Ears: No external lesions.   Nose: No external lesions or discharge.    Oropharynx: clear, MMM.     Neck: Trachea midline. No JVD. Normal ROM. No apparent masses.    Respiratory: Effort normal without accessory muscle use.  CTAB.    Cardiovascular: RRR, no MRG.  No appreciable peripheral edema. Brisk cap refill.    Abdomen: Soft, non-distended, non-tender. No palpable masses or hernias.    Genitourinary/rectal: Deferred     Musculoskeletal: Normal ROM. No deformity. Strength grossly intact throughout.    Neurological: Pt is alert. No focal motor deficits. Speech normal. CN II-XII grossly normal.     Psychiatric: Affect appropriate. No agitation.     Skin: Warm, dry, well perfused. No rash noted.      DIAGNOSTIC STUDIES     EKG  If obtained, I have independently interpreted the EKGs associated with this visit while awaiting final read from the cardiologist.  Please refer to ED course section for my independent interpretation.     Last EKG Result       Procedure Component Value Units Date/Time  ECG 12 lead [[id number]] Collected: 01/15/22 1227     Updated: 01/15/22 1229     Patient Age 68 years      Patient DOB 10-29-1945     Patient Height --     Patient Weight --     Interpretation Text --     Sinus rhythm  Low voltage, precordial leads  RSR' in V1 or V2, right VCD or RVH  Probable LVH with secondary repol abnrm       Physician Interpreter --     Ventricular Rate 83 //min      QRS Duration 92 ms      P-R Interval 161 ms      Q-T Interval 372 ms      Q-T Interval(Corrected) 437 ms      P Wave Axis 0 deg      QRS Axis -15 deg      T Axis 25 years             LAB STUDIES  All lab values have been personally reviewed by me.  Results       Procedure Component Value Units Date/Time    Troponin I [[id number]]  (Abnormal) Collected: 01/15/22 1520    Specimen: Plasma Updated: 01/15/22 1703     Troponin I 0.07 ng/mL     Troponin I [[id number]]  (Abnormal) Collected: 01/15/22 1240    Specimen: Plasma Updated: 01/15/22 1553     Troponin I 0.08 ng/mL     Comprehensive metabolic panel [[id number]]  (Abnormal) Collected: 01/15/22 1240    Specimen: Plasma Updated: 01/15/22 1315     Sodium 140 mMol/L      Potassium 3.9 mMol/L      Chloride 108 mMol/L       CO2 21 mMol/L      Calcium 10.1 mg/dL      Glucose 324 mg/dL      Creatinine 4.01 mg/dL      BUN 17 mg/dL      Protein, Total 7.6 gm/dL      Albumin 4.5 gm/dL      Alkaline Phosphatase 66 U/L      ALT 21 U/L      AST (SGOT) 19 U/L      Bilirubin, Total 1.2 mg/dL      Albumin/Globulin Ratio 1.45 Ratio      Anion Gap 14.9 mMol/L      BUN / Creatinine Ratio 15.6 Ratio      EGFR 71 mL/min/1.34m2      Osmolality Calculated 284 mOsm/kg      Globulin 3.1 gm/dL     CBC and differential [[id number]] Collected: 01/15/22 1240    Specimen: Blood Updated: 01/15/22 1303     WBC 5.8 K/cmm      RBC 5.07 M/cmm      Hemoglobin 15.9 gm/dL      Hematocrit 40.3 %      MCV 94 fL      MCH 31 pg      MCHC 33 gm/dL      RDW 47.4 %      PLT CT 252 K/cmm      MPV 8.4 fL      Neutrophils % 67.8 %      Lymphocytes 25.8 %      Monocytes 5.2 %      Eosinophils % 0.5 %      Basophils % 0.7 %      Neutrophils Absolute 4.0 K/cmm      Lymphocytes  Absolute 1.5 K/cmm      Monocytes Absolute 0.3 K/cmm      Eosinophils Absolute 0.0 K/cmm      Basophils Absolute 0.0 K/cmm     Collect Blood [[id number]] Collected: 01/15/22 1240    Specimen: Other Updated: 01/15/22 1247     Collect Blood Label Notification            RADIOLOGIC STUDIES    If obtained, I have independently interpreted the diagnostic imaging associated with this visit while awaiting final read from the radiologist.  Please refer to ED course section for my independent interpretation.      Radiologist interpretation:  XR Chest 2 Views    Result Date: 01/15/2022  Status post CABG. Elevated right hemidiaphragm. No acute cardiopulmonary process. ReadingStation:[id number]        ----------------------------------------------------------------------------------------------------------------------    Assessment & Plan     ED COURSE         INITIAL ASSESSMENT, COURSE, AND PLAN  ED Narrative:  ED Course as of 01/16/22 1613   Wed Jan 15, 2022   1515 This patient was seen in the ED by the APC, Santo Held, PA.  We discussed the case and I agree with the medical decision making.   [CS]   Thu Jan 16, 2022   1612 I have independently interpreted the following EKG/imaging study associated with this visit while awaiting final read from the cardiologist/radiologist.  Preliminary interpretation as follows:  Chest x-ray shows no infiltrate or pneumothorax.    EKG shows a sinus rhythm with no ST or T wave abnormalities.  Rate of 83 with a QTc of 437. [NS]      ED Course User Index  [CS] Skip Estimable, MD  [NS] Consuelo Pandy, [place]       Procedures  Procedures      DISPOSITION AND DISCUSSIONS    I discussed management of the patient with the following physicians and APPs: I discussed the patient with Dr. [name] regarding patient's presentation and my work-up and interventions in the emergency department.  We agreed upon a plan where if patient's troponin came down upon repeat that he would be a reasonable candidate for discharge home care with doubling of his lisinopril from 20 mg to 40 mg with close follow-up in their clinic. I discussed this case with Dr Clelia Croft in the emergency department who agrees with the assessment and treatment plan.       I discussed management of the patient with other QHP or appropriate sources: None    Escalation of Care considered, and ultimately not performed: No IV fluids. and At this time, the patient is most appropriate for outpatient management.    Barriers to Care at this time, including, but not limited to: None    Protocols and prescription drugs considered including but not limited to: Medication Modification        Note:  This chart was generated by an EMR using speech recognition software and may contain errors or omissions not intended by the user. If there are questions or concerns about the content of this note or information contained within the body of this dictation they should be addressed directly with the Chartered loss adjuster for clarification                                                 Woody Seller,  Lilyan Punt, [place]  01/16/22 1613       Skip Estimable, MD  01/18/22 [phone]

## 2022-01-15 NOTE — Discharge Summary (Signed)
[facility]      Patient: Duane Thomas  Admission Date: 01/09/2022   DOB: August 24, 1946  Discharge Date: 01/15/2022    MRN: [id number]  Discharge Attending: No att. providers found    Referring Physician: Pcp, None, MD  PCP: Pcp, None, MD       DISCHARGE SUMMARY     Discharge Information   Admission Diagnosis:   Elevated troponin     Discharge Diagnosis:   Patient Active Problem List    Diagnosis Date Noted    Elevated troponin 01/09/2022    Hyperlipidemia, unspecified hyperlipidemia type 12/24/2021    Hypertension, unspecified type 12/24/2021    Coronary artery disease involving native heart without angina pectoris, unspecified vessel or lesion type 12/24/2021    S/P CABG (coronary artery bypass graft) 12/24/2021    Nonrheumatic mitral valve regurgitation 12/24/2021    Acute kidney injury 08/29/2021        Discharge Medications:     Medication List        CHANGE how you take these medications      lisinopril 20 MG tablet  Commonly known as: ZESTRIL  Take 1 tablet (20 mg) by mouth daily  What changed:   medication strength  how much to take  when to take this            CONTINUE taking these medications      aspirin 81 MG chewable tablet     atorvastatin 40 MG tablet  Commonly known as: LIPITOR  Take 1 tablet (40 mg) by mouth daily     doxycycline 100 MG capsule  Commonly known as: VIBRAMYCIN     finasteride 5 MG tablet  Commonly known as: PROSCAR     metFORMIN 500 MG tablet  Commonly known as: GLUCOPHAGE  Take 1 tablet (500 mg total) by mouth 2 (two) times daily with meals     metoprolol succinate XL 25 MG 24 hr tablet  Commonly known as: TOPROL-XL     tamsulosin 0.4 MG Caps  Commonly known as: FLOMAX     vitamin D 1000 UNIT tablet  Commonly known as: cholecalciferol               Where to Get Your Medications        These medications were sent to [facility] [address]), [place] - [address]  [address], [place]) [place] [address]      Phone: [phone]   lisinopril 20  MG tablet             Hospital Course   Presentation History   Mr. Visconti presented to the emergency department with hypertension.  Initial troponin was elevated to 0.08. he does have a history of coronary artery disease. He was also noted to be hypertensive.He was subsequently admitted to the observation unit for serial troponins, cardiology consultation, and trend of troponins.    Hospital Course (0 Days)   Patient did well overnight, no acute events.  His blood pressureshave trended down and have ranged from 120s-140s primarily.  She will troponins have down trended, 0.08 to 0.06.  He is not having any chest pain.  Cardiology was consulted and does not feel that his abnormal troponin represents ACS.  They have recommended outpatient cardiology follow-up and blood pressure management.  We will increase the patient's lisinopril from 5 mg to 20 mg.  I recommended he schedule a follow-up visit with his PCP and cardiologist through the [place].  He is in agreement with this plan.    Procedures/Imaging:   XR Chest AP Portable   Final Result   FINDINGS/IMPRESSION:   Persistent elevation of the right hemidiaphragm, likely from paralysis.    Minimal subsegmental atelectasis in the right lung base.   No acute process in the left.      ReadingStation:[id number]                 Progress Note/Physical Exam at Discharge   Vitals:    01/10/22 0411 01/10/22 0748 01/10/22 0834 01/10/22 1503   BP: 109/66 149/84 149/78 132/73   Pulse: 67 65 63 73   Resp: 16 16     Temp: 97.2 F (36.2 C) 97.3 F (36.3 C)     TempSrc: Temporal Temporal  Temporal   SpO2: 96% 96%     Weight:       Height:         Constitutional: WD/WN, active, NAD   Respiratory: Effort normal without accessory muscle use. CTAB.  Cardiovascular: RRR, no MRG.  No appreciable peripheral edema. Brisk cap refill.  Abdomen: Soft, non-distended, non-tender.   Musculoskeletal: Normal ROM. No deformity. Strength grossly intact throughout.  Neurological: Pt is alert. No focal motor  deficits.  Skin: Warm, dry, well perfused. No rash noted.         Diagnostics     Labs/Studies Pending at Discharge: No    Last Labs   Recent Labs   Lab 01/09/22  1114   WBC 6.2   RBC 4.84   Hemoglobin 14.3   Hematocrit 44.6   MCV 92   PLT CT 246       Recent Labs   Lab 01/09/22  1538 01/09/22  1114   Sodium  --  136   Potassium  --  3.9   Chloride  --  105   CO2  --  21   BUN  --  16   Creatinine  --  1.09   Glucose  --  156*   Calcium  --  9.9   Magnesium 1.7  --         Patient Instructions   Discharge Diet: regular diet  Discharge Activity:  activity as tolerated    Follow Up Appointment:   Follow-up Information       [facility]. Schedule an appointment as soon as possible for a visit.    Specialty: Internal Medicine  Why: As soon as possible for hospital follow-up if you do not have a primary care physician  Contact information:  [address]  [place] [address]  [phone]                            Time spent examining patient, discussing with patient/family regarding hospital course, chart review, reconciling medications and discharge planning: 30 minutes.      Ernestine Conrad, MD    12:32 PM 01/15/2022

## 2022-01-15 NOTE — ED Triage Notes (Signed)
Patient presented to the ED for high blood pressure. He has a history of hypertension and tracks his BP. He was told to come in if it came too high. At home it was 170/110's, so he came in for evaluation.

## 2022-01-15 NOTE — Discharge Instructions (Signed)
As discussed, your cardiac markers improved while you are here in the emergency department.  I spoke with Dr. [name] about you and I agree with her that you are reasonable candidate for discharge home care.  Per her recommendation I will double your lisinopril dose from 20 mg to 40 mg and I have sent a prescription of this medication into your pharmacy.  You are welcome to take 2 of your 20 mg lisinopril's until you pick it up.  Please follow-up with Dr. [name] in her clinic by appointment and please do not hesitate to return to the emergency department should you have any worsening symptoms or any other concern regarding your encounter today.

## 2022-01-17 ENCOUNTER — Telehealth: Payer: Self-pay

## 2022-01-17 NOTE — Telephone Encounter (Signed)
No current PCP on file. Updates requested in Care Everywhere. Patient has upcoming appointment 01/23/2022.

## 2022-01-18 LAB — ECG 12-LEAD
P Wave Axis: 0 deg
P-R Interval: 161 ms
Patient Age: 75 years
Q-T Interval(Corrected): 437 ms
Q-T Interval: 372 ms
QRS Axis: -15 deg
QRS Duration: 92 ms
T Axis: 25 years
Ventricular Rate: 83 //min

## 2022-01-21 NOTE — Telephone Encounter (Signed)
EKG RECEIVED AND SCANNED TO 4.26.23 SCAN ONLY

## 2022-01-23 ENCOUNTER — Ambulatory Visit: Payer: No Typology Code available for payment source

## 2022-01-23 VITALS — BP 118/68 | HR 68 | Ht 68.0 in | Wt 195.2 lb

## 2022-01-23 DIAGNOSIS — I10 Essential (primary) hypertension: Secondary | ICD-10-CM

## 2022-01-23 NOTE — Progress Notes (Signed)
Cardiology Follow up Note    Patient Name: Duane Thomas   Date of Birth: 1945/09/12    Provider: [facility]     Patient Care Team:  Pcp, None, MD as PCP - General  Alluri, Theodora Blow, MD as Consulting Physician (Cardiology)    Chief Complaint: Hypertension [facility])    History of Present Illness   Duane Thomas is a 76 y.o. male is being seen here today for URGENT visit for complaints of hypertension.     BP today is 118/68. He feels that maybe he was checking too often.     He saw Dr. [name] on 12/24/21  He had an ER visit on 01/15/22 for hypertension.   He presented with hypertension and elevated troponin. BP 172/86 and decreased to 154/76.  Trop 0.08. Lisinopril was increased to 40mg  and troponin decreased to 0.07.     He had been taking BP every 30 minutes throughout the day. He was told that this caused elevated BP.     Today he is doing well and reporting BP in the 115s-130s/60s-70s.    He denies chest pain, palpitations, SOB, edema, lightheadedness, dizziness, presyncope or syncope.    He lives in [place] and would like to move here to be close to his son. Provided phone numbers for PCP offices and Urology offices in [place] as requested.    Reviewed all notes from ED and BP logs. Patient had long discussion about previous care and specialties as well as medical history.    Review of Systems   CONSTITUTIONAL: There is no history of fever, weight loss or cough.  DERMATOLOGIC: The patient denies any lesions or rashes.  ENT: No history of congestion, postnasal drip, sore throat or hearing changes.  RESPIRATORY: See HPI   CARDIOVASCULAR: See HPI   GASTROINTESTINAL: No history of nausea, vomiting, diarrhea or abdominal pain.  GENITOURINARY: No history of dysuria or polyuria   MUSCULOSKELETAL: No significant muscle weakness   ENDOCRINE: Denies any heat or cold intolerance or excessive thirst or urination.  NEUROLOGICAL: See HPI  PSYCHIATRIC: No apparent significant depression     Past  Medical History     Past Medical History:   Diagnosis Date    Coronary artery disease     s/p CABG    Enlarged prostate     Hyperlipidemia     Hypertension     Neurogenic amyotrophy     Neuropathy     Self-catheterizes urinary bladder     Pt uses 14 fr    Type 2 diabetes mellitus, controlled        >URGENT CLINIC<    PMH: AC/te 01/23/22    LOV 12/24/21 SPA  Last EKG 01/15/22  LABS 01/15/22 Epic    *HV 01/15/22: HTN*  *HV 5/11-5/12/23: Elevated troponin *    - CAD   A) CABG 2011   B) Echo 12/02/13 : Mild concentric LVH. EF >70%. Grade I DD. RV is normal in size and function. MV appears normal in structure and function. RV systolic pressure is estimated to be ENL. AV appears normal in structure and function. No hemodynamically significant valvular aortic stenosis. Aortic root is normal size.  C) Echo 12/12/20 :  LV is normal in size. There is mild concentric LVH. SF is normal with an estimated EF = 60-65%. Contrast agent was administered and successfully enabled adequate endocardial definition.  No regional wall motion abnormalities detected. There is grade II  DD of LV.  LA is moderately enlarged.  MR is moderate. RVSP is estimated at 40-32mmHg.     - Bradycardia  - HTN   - Hyperlipidemia   - LUE pain  - Neurogenic bladder  - DM type II   - Vit D deficiency    Past Surgical History     Past Surgical History:   Procedure Laterality Date    CARDIAC SURGERY      bypass x6    COLONOSCOPY N/A 01/09/2015    Procedure: COLONOSCOPY;  Surgeon: Chauncey Fischer, MD;  Location: [facility];  Service: Gastroenterology;  Laterality: N/A;    COLONOSCOPY N/A 08/09/2018    Procedure: COLONOSCOPY;  Surgeon: Chauncey Fischer, MD;  Location: [facility];  Service: Gastroenterology;  Laterality: N/A;    CORONARY ARTERY BYPASS GRAFT      left elbow      TONSILLECTOMY         Family History     Family History   Problem Relation Age of Onset    No known problems Mother     No known problems Father        Social History     Social  History     Tobacco Use    Smoking status: Never    Smokeless tobacco: Never   Vaping Use    Vaping status: Never Used   Substance Use Topics    Alcohol use: No    Drug use: No       Allergies     is allergic to fentanyl.    Medications       Current Outpatient Medications:     aspirin 81 MG chewable tablet, Chew 1 tablet (81 mg) by mouth daily, Disp: , Rfl:     atorvastatin (LIPITOR) 40 MG tablet, Take 1 tablet (40 mg) by mouth daily, Disp: 90 tablet, Rfl: 1    Cholecalciferol (VITAMIN D) 1000 UNIT tablet, Take 1 tablet (1,000 Units) by mouth daily, Disp: , Rfl:     finasteride (PROSCAR) 5 MG tablet, Take 1 tablet (5 mg) by mouth daily, Disp: , Rfl:     lisinopril (ZESTRIL) 40 MG tablet, Take 0.5 tablets (20 mg) by mouth daily, Disp: , Rfl:     metFORMIN (GLUCOPHAGE) 500 MG tablet, Take 1 tablet (500 mg total) by mouth 2 (two) times daily with meals, Disp: 60 tablet, Rfl: 0    metoprolol succinate XL (TOPROL-XL) 25 MG 24 hr tablet, Take 1 tablet (25 mg) by mouth daily, Disp: , Rfl:     Multiple Vitamins-Minerals (PRESERVISION AREDS 2 PO), Take 1 tablet by mouth daily, Disp: , Rfl:     tamsulosin (FLOMAX) 0.4 MG Cap, Take 1 capsule (0.4 mg) by mouth daily, Disp: , Rfl:       Physical Exam     Visit Vitals  BP 118/68 (BP Site: Left arm, Patient Position: Sitting)   Pulse 68   Ht 1.727 m (5\' 8" )   Wt 88.5 kg (195 lb 3.2 oz)   BMI 29.68 kg/m     Vitals:    01/23/22 1445   BP: 118/68   BP Site: Left arm   Patient Position: Sitting   Pulse: 68   Weight: 88.5 kg (195 lb 3.2 oz)   Height: 1.727 m (5\' 8" )     Wt Readings from Last 3 Encounters:   01/23/22 88.5 kg (195 lb 3.2 oz)   01/15/22 89 kg (196 lb 3.4 oz)   01/09/22 89 kg (196 lb 3.4  oz)        Constitutional -  Well appearing, and in no distress  Eyes - Extraocular eye movements intact, sclera anicteric  Oropharynx - UTA, wearing mask due to COVID protocol   Respiratory - Clear to auscultation bilaterally, normal respiratory effort    Cardiovascular system -     Regular rate and rhythm    Normal S1, S2    No murmurs, rubs, gallops   Jugular venous pulse is normal        No carotid bruits auscultated   2+ pulses in the posterior tibial bilaterally    Neurological - Alert, oriented, no focal neurological deficits  Extremities -  No clubbing or cyanosis. No peripheral edema  Skin - Warm and dry  Psych- Appropriate affect    Labs     Lab Results   Component Value Date/Time    WBC 5.8 01/15/2022 12:40 PM    RBC 5.07 01/15/2022 12:40 PM    HGB 15.9 01/15/2022 12:40 PM    HCT 47.4 01/15/2022 12:40 PM    PLT 252 01/15/2022 12:40 PM        Lab Results   Component Value Date/Time    NA 140 01/15/2022 12:40 PM    K 3.9 01/15/2022 12:40 PM    CL 108 01/15/2022 12:40 PM    CO2 21 01/15/2022 12:40 PM    GLU 161 (H) 01/15/2022 12:40 PM    BUN 17 01/15/2022 12:40 PM    CREAT 1.09 01/15/2022 12:40 PM    PROT 7.6 01/15/2022 12:40 PM    ALKPHOS 66 01/15/2022 12:40 PM    AST 19 01/15/2022 12:40 PM    ALT 21 01/15/2022 12:40 PM       Lab Results   Component Value Date/Time    CHOL 150 12/24/2021 10:25 AM    TRIG 92 12/24/2021 10:25 AM    HDL 42 12/24/2021 10:25 AM    LDL 90 12/24/2021 10:25 AM       Lab Results   Component Value Date/Time    HGBA1CPERCNT 6.5 01/09/2022 11:14 AM       Cardiogenics:   EKG:     Impression and Recommendations:   1. CAD s/p CABG in 2011 - he is asymptomatic.   - Continue ASA 81mg  and lipitor 20mg  daily   - Repeat lipid panel reviewed 12/24/21, LDL 90     2. Neurogenic bladder - follows with urology     3. HLD   - Continue current dose of statin      5. HTN - BP at goal today and at home, recent ER visit for hypertensive urgency, lisinopril was increased to 40mg . He has kept it at 20mg  due to normal BP after he stopped checking BP Q 30 minutes.  - Continue current regimen      6. Bradycardia - asymptomatic HR 60s  - Continue current dose of BB      7. Mitral regurgitation - moderate on TTE 11/2020.   - Will repeat TTE- scheduled next month.       RTO as scheduled  next month. If any problems, questions or concerns, Patient was instructed to contact office and return sooner     Respectfully,   Brigitte Pulse, NP  01/23/2022  Electronically signed by:  Wonda Cerise [facility]      Note: This chart was generated by the [facility] EMR system/speech recognition and may contain inherit omission or errors not intended by the user.  Grammatical errors, random word insertions, deletions, pronoun errors and incomplete sentences are occasionally consequences of this technology due to software limitations.  Not all errors are caught or corrected.  If there are questions or concerns about the content of this note or information contained in the body of this dictation they should be addressed directly with the author for clarification.

## 2022-02-03 ENCOUNTER — Inpatient Hospital Stay: Payer: No Typology Code available for payment source | Admitting: Physician Assistant

## 2022-02-21 ENCOUNTER — Ambulatory Visit: Payer: No Typology Code available for payment source

## 2022-02-21 DIAGNOSIS — I340 Nonrheumatic mitral (valve) insufficiency: Secondary | ICD-10-CM

## 2022-04-08 NOTE — Progress Notes (Signed)
Cardiology Office Note:    Date:  04/14/2022   ID:  Kyle Richards, DOB Feb 14, 1946, MRN [id number]  PCP:  Clinic, [facility] Providers Cardiologist:  None {    Referring MD: Clinic, [facility]    History of Present Illness:    Kyle Richards is a 76 y.o. male with a hx of CAD s/p CABG in 2011, HTN, HLD, DMII, , and neurogenic bladder who presents to clinic for follow-up.  Was seen in the ER 01/23/21 where he presented with a hand tremor found to have soft blood pressures. Orthostatics in ER normal. He was reassured and discharged home.  Was seen in follow-up in 03/2021 to establish care where he was doing well from a CV standpoint.  Today, the patient states he was recently in the ER several times for elevated blood pressures. Specifically, he decided to keep a BP log and was checking his blood pressure every 2 hours. He found that it would steadily increase and became as high as 180s on several rechecks. It was thought that some of his BP elevation was due to anxiety associated with frequent BP checks. Ultimately, his lisinopril was increased to '20mg'$  daily and his blood pressure is much better.  Currently, the patient feels well. No chest pain, LE edema, SOB, orthopnea, PND, palpitations. Tolerating medications without issue. Had repeat TTE at OSH which showed interval improvement in MR to mild.    Past Medical History:  Diagnosis Date   Diabetes ([place])    Heart attack ([place]) 2012   High cholesterol    Hypertension    Neuropathy     Past Surgical History:  Procedure Laterality Date   FINGER FRACTURE SURGERY Left    middle finger left hand   TONSILLECTOMY AND ADENOIDECTOMY  1953    Current Medications: Current Meds  Medication Sig   aspirin 81 MG chewable tablet Chew 81 mg by mouth daily.   atorvastatin (LIPITOR) 20 MG tablet Take 20 mg by mouth daily.   finasteride (PROSCAR) 5 MG tablet Take 5 mg by mouth daily.   lisinopril  (ZESTRIL) 20 MG tablet Take 20 mg by mouth daily.   metFORMIN (GLUCOPHAGE) 500 MG tablet Take 500 mg by mouth 2 (two) times daily.   metoprolol succinate (TOPROL XL) 25 MG 24 hr tablet Take 1 tablet (25 mg total) by mouth daily.   tamsulosin (FLOMAX) 0.4 MG CAPS capsule Take 1 capsule (0.4 mg total) by mouth daily.   VITAMIN D PO Take 1,000 Units by mouth daily.   [DISCONTINUED] lisinopril (PRINIVIL,ZESTRIL) 5 MG tablet Take 5 mg by mouth daily.     Allergies:   Fentanyl   Social History   Socioeconomic History   Marital status: Single    Spouse name: Not on file   Number of children: 2   Years of education: Not on file   Highest education level: Not on file  Occupational History   Not on file  Tobacco Use   Smoking status: Never    Passive exposure: Never   Smokeless tobacco: Never  Vaping Use   Vaping Use: Never used  Substance and Sexual Activity   Alcohol use: Yes    Comment: 1 beer monthly   Drug use: Never   Sexual activity: Not Currently  Other Topics Concern   Not on file  Social History Narrative   Not on file   Social Determinants of Health   Financial Resource Strain: Not on file  Food  Insecurity: Not on file  Transportation Needs: Not on file  Physical Activity: Not on file  Stress: Not on file  Social Connections: Not on file     Family History: The patient's family history is not on file.  ROS:   Review of Systems  Constitutional:  Negative for fever and malaise/fatigue.  HENT:  Negative for congestion and ear pain.   Respiratory:  Negative for cough, shortness of breath and wheezing.   Cardiovascular:  Negative for chest pain, palpitations, orthopnea, claudication, leg swelling and PND.  Gastrointestinal:  Negative for diarrhea and nausea.  Musculoskeletal:  Positive for myalgias.  Neurological:  Negative for loss of consciousness.     EKGs/Labs/Other Studies Reviewed:    The following studies were reviewed today: TTE 03-06-22:    FINDINGS   2D / M-MODE   Left Ventricle     The left ventricular cavity size is normal. The left ventricular wall thicknesses are normal. The left                        ventricular ejection fraction is normal, estimated at 55-60%. There are no regional wall motion abnormalities                        present. There is normal diastolic function.   Right Ventricle    The right ventricle is normal in size and systolic function.   Left Atrium        The left atrium is mildly enlarged. Left atrial volume index is mildly increased at 38.2 mL/m2.   Right Atrium       The right atrium is normal in size.   Aorta              The aortic root is normal in size.   Vessels            The inferior vena cava (IVC) is normal in size. Normal respiratory variation of the inferior vena cava is                        present. Estimated right atrial pressure of 3 mmHg.   Pericardium        No significant pericardial effusion detected.    VALVULAR / DOPPLER   Aortic Valve       The aortic valve is trileaflet. Aortic valve leaflets are mildly thickened/sclerotic. There is aortic valve                        sclerosis with no stenosis or regurgitation.   Mitral Valve       The mitral valve appears structurally normal. There is mild mitral annular calcification. The mitral valve                        regurgitation is mild. There is no mitral stenosis.   Tricuspid Valve    The tricuspid valve appears structurally normal. The tricuspid regurgitation is trace. No significant                        stenosis. Estimated PASP of 26 mmHg.   Pulmonary Valve    The pulmonic valve is not well visualized. There is no significant stenosis. There is no significant  regurgitation.    ADDITIONAL FINDINGS   Atrial Septum      No evidence of an atrial septal defect by color doppler.    Conclusions        1: The left ventricular cavity size is normal.                      2: Left ventricular ejection fraction  is normal, estimated at 55-60%.                        3: No regional wall motion abnormalities.                        4: There is normal diastolic function.                      5: Right ventricle is normal in size and systolic function.                      6: Left atrium is mildly dilated.                      7: Aortic valve sclerosis without significant stenosis or regurgitation.                      8: Mild mitral annular calcifications without significant stenosis. Mild mitral regurgitation.                      9: Estimated PASP of 26 mmHg.                      10: Compared to the study on 12/12/20; MR was previously noted to be moderate. Estimated PASP has                        improved from prior 40-45 mmHg.     TTE: 12/12/2020 EF 98-92% Grade 2 diastolic dysfunction Moderate left atrial enlargement Moderate MR with ERO 0.18 cm squared R vol: 81m  Mild TR  EKG:   No new ECG today   Recent Labs: 09/10/2021: ALT 29 09/16/2021: BUN 18; Creatinine, Ser 1.32; Hemoglobin 15.2; Platelets 230; Potassium 4.2; Sodium 133  Recent Lipid Panel No results found for: "CHOL", "TRIG", "HDL", "CHOLHDL", "VLDL", "LDLCALC", "LDLDIRECT"   Risk Assessment/Calculations:           Physical Exam:    VS:  BP 134/78   Pulse 68   Ht '5\' 8"'$  (1.727 m)   Wt 196 lb 9.6 oz (89.2 kg)   SpO2 95%   BMI 29.89 kg/m     Wt Readings from Last 3 Encounters:  04/14/22 196 lb 9.6 oz (89.2 kg)  12/30/21 192 lb 1 oz (87.1 kg)  12/26/21 195 lb (88.5 kg)     GEN: Well nourished, well developed in no acute distress HEENT: Normal NECK: No JVD; No carotid bruits CARDIAC: RRR, 1/6 systolic murmur LUSB RESPIRATORY:  Clear to auscultation without rales, wheezing or rhonchi  ABDOMEN: Soft, non-tender, non-distended MUSCULOSKELETAL:  No edema; No deformity  SKIN: Warm and dry NEUROLOGIC:  Alert and oriented x 3 PSYCHIATRIC:  Normal affect   ASSESSMENT:    1. Coronary artery disease involving  native coronary artery of native heart without angina pectoris   2. Hx of CABG   3. Type 2  diabetes mellitus with other circulatory complication, without long-term current use of insulin ([place])   4. Moderate mitral insufficiency   5. Mixed hyperlipidemia   6. Primary hypertension   7. Orthostatic hypotension    PLAN:    In order of problems listed above:  #CAD s/p CABG on 2011: Doing well without anginal symptoms. Tolerating medications as prescribed. TTE  11/2020 with LVEF 55-60%, no WMA, G2DD, moderate MR. Repeat TTE in 01/2022 with LVEF 55-60%, mild MR. -Continue metop succinate '25mg'$  XL daily -Continue lisinopril '20mg'$  daily -Continue lipitor '20mg'$  daily -Continue ASA '81mg'$  daily  #Moderate MR>mild: Improved to mild based on TTE in 02/21/22. -Monitor with repeat echo in 2025  #HTN: Well controlled at home with recent increase in lisinopril. No orthostatic symptoms. -Continue metop succinate '25mg'$  XL daily -Continue lisinopril '20mg'$  daily  #Episode of low blood pressure/suspected orthostasis: Presented to ER in 12/2020 with soft blood pressures. Orthostatics negative and work-up reassruing. No current symptoms.  -Resolved  #HLD: -Continue lipitor '20mg'$  daily -Labs monitored at [facility]; goal LDL<70  #DMII: -Continue metformin '500mg'$  BID  #Neurogenic Bladder: -Self catheterizes     Medication Adjustments/Labs and Tests Ordered: Current medicines are reviewed at length with the patient today.  Concerns regarding medicines are outlined above.  No orders of the defined types were placed in this encounter.  No orders of the defined types were placed in this encounter.   Patient Instructions  Medication Instructions:   Your physician recommends that you continue on your current medications as directed. Please refer to the Current Medication list given to you today.  *If you need a refill on your cardiac medications before your next appointment, please call your  pharmacy*   Follow-Up: At [facility], you and your health needs are our priority.  As part of our continuing mission to provide you with exceptional heart care, we have created designated Provider Care Teams.  These Care Teams include your primary Cardiologist (physician) and Advanced Practice Providers (APPs -  Physician Assistants and Nurse Practitioners) who all work together to provide you with the care you need, when you need it.  We recommend signing up for the patient portal called "MyChart".  Sign up information is provided on this After Visit Summary.  MyChart is used to connect with patients for Virtual Visits (Telemedicine).  Patients are able to view lab/test results, encounter notes, upcoming appointments, etc.  Non-urgent messages can be sent to your provider as well.   To learn more about what you can do with MyChart, go to [email or web address].    Your next appointment:   1 year(s)  The format for your next appointment:   In Person  Provider:   DR. Johney Frame  Important Information About Sugar           I,Zite Okoli,acting as a scribe for Freada Bergeron, MD.,have documented all relevant documentation on the behalf of Freada Bergeron, MD,as directed by  Freada Bergeron, MD while in the presence of Freada Bergeron, MD.   I, Freada Bergeron, MD, have reviewed all documentation for this visit. The documentation on 04/14/22 for the exam, diagnosis, procedures, and orders are all accurate and complete.   Signed, Freada Bergeron, MD  04/14/2022 4:32 PM    [facility]

## 2022-04-14 ENCOUNTER — Encounter: Payer: Self-pay | Admitting: Cardiology

## 2022-04-14 ENCOUNTER — Ambulatory Visit (INDEPENDENT_AMBULATORY_CARE_PROVIDER_SITE_OTHER): Payer: Medicare Other | Admitting: Cardiology

## 2022-04-14 VITALS — BP 134/78 | HR 68 | Ht 68.0 in | Wt 196.6 lb

## 2022-04-14 DIAGNOSIS — I2510 Atherosclerotic heart disease of native coronary artery without angina pectoris: Secondary | ICD-10-CM

## 2022-04-14 DIAGNOSIS — I10 Essential (primary) hypertension: Secondary | ICD-10-CM

## 2022-04-14 DIAGNOSIS — E1159 Type 2 diabetes mellitus with other circulatory complications: Secondary | ICD-10-CM | POA: Diagnosis not present

## 2022-04-14 DIAGNOSIS — I340 Nonrheumatic mitral (valve) insufficiency: Secondary | ICD-10-CM | POA: Diagnosis not present

## 2022-04-14 DIAGNOSIS — E782 Mixed hyperlipidemia: Secondary | ICD-10-CM

## 2022-04-14 DIAGNOSIS — Z951 Presence of aortocoronary bypass graft: Secondary | ICD-10-CM | POA: Diagnosis not present

## 2022-04-14 DIAGNOSIS — I951 Orthostatic hypotension: Secondary | ICD-10-CM

## 2022-04-14 NOTE — Patient Instructions (Signed)
Medication Instructions:   Your physician recommends that you continue on your current medications as directed. Please refer to the Current Medication list given to you today.  *If you need a refill on your cardiac medications before your next appointment, please call your pharmacy*   Follow-Up: At [facility], you and your health needs are our priority.  As part of our continuing mission to provide you with exceptional heart care, we have created designated Provider Care Teams.  These Care Teams include your primary Cardiologist (physician) and Advanced Practice Providers (APPs -  Physician Assistants and Nurse Practitioners) who all work together to provide you with the care you need, when you need it.  We recommend signing up for the patient portal called "MyChart".  Sign up information is provided on this After Visit Summary.  MyChart is used to connect with patients for Virtual Visits (Telemedicine).  Patients are able to view lab/test results, encounter notes, upcoming appointments, etc.  Non-urgent messages can be sent to your provider as well.   To learn more about what you can do with MyChart, go to [email or web address].    Your next appointment:   1 year(s)  The format for your next appointment:   In Person  Provider:   DR. Johney Frame  Important Information About Sugar

## 2022-04-30 ENCOUNTER — Encounter: Payer: Self-pay | Admitting: Nurse Practitioner

## 2022-04-30 DIAGNOSIS — E1169 Type 2 diabetes mellitus with other specified complication: Secondary | ICD-10-CM | POA: Insufficient documentation

## 2022-04-30 DIAGNOSIS — E1142 Type 2 diabetes mellitus with diabetic polyneuropathy: Secondary | ICD-10-CM | POA: Insufficient documentation

## 2022-04-30 DIAGNOSIS — E1129 Type 2 diabetes mellitus with other diabetic kidney complication: Secondary | ICD-10-CM | POA: Insufficient documentation

## 2022-04-30 DIAGNOSIS — N319 Neuromuscular dysfunction of bladder, unspecified: Secondary | ICD-10-CM | POA: Insufficient documentation

## 2022-04-30 DIAGNOSIS — E119 Type 2 diabetes mellitus without complications: Secondary | ICD-10-CM | POA: Insufficient documentation

## 2022-04-30 DIAGNOSIS — I152 Hypertension secondary to endocrine disorders: Secondary | ICD-10-CM | POA: Insufficient documentation

## 2022-04-30 DIAGNOSIS — E669 Obesity, unspecified: Secondary | ICD-10-CM | POA: Insufficient documentation

## 2022-04-30 DIAGNOSIS — N400 Enlarged prostate without lower urinary tract symptoms: Secondary | ICD-10-CM | POA: Insufficient documentation

## 2022-04-30 DIAGNOSIS — I2510 Atherosclerotic heart disease of native coronary artery without angina pectoris: Secondary | ICD-10-CM | POA: Insufficient documentation

## 2022-04-30 DIAGNOSIS — E785 Hyperlipidemia, unspecified: Secondary | ICD-10-CM | POA: Insufficient documentation

## 2022-04-30 NOTE — Patient Instructions (Incomplete)
Increase Metformin to 1000 MG twice a day (two tablets).  Diabetes Mellitus and Foot Care Foot care is an important part of your health, especially when you have diabetes. Diabetes may cause you to have problems because of poor blood flow (circulation) to your feet and legs, which can cause your skin to: Become thinner and drier. Break more easily. Heal more slowly. Peel and crack. You may also have nerve damage (neuropathy) in your legs and feet, causing decreased feeling in them. This means that you may not notice minor injuries to your feet that could lead to more serious problems. Noticing and addressing any potential problems early is the best way to prevent future foot problems. How to care for your feet Foot hygiene  Wash your feet daily with warm water and mild soap. Do not use hot water. Then, pat your feet and the areas between your toes until they are completely dry. Do not soak your feet as this can dry your skin. Trim your toenails straight across. Do not dig under them or around the cuticle. File the edges of your nails with an emery board or nail file. Apply a moisturizing lotion or petroleum jelly to the skin on your feet and to dry, brittle toenails. Use lotion that does not contain alcohol and is unscented. Do not apply lotion between your toes. Shoes and socks Wear clean socks or stockings every day. Make sure they are not too tight. Do not wear knee-high stockings since they may decrease blood flow to your legs. Wear shoes that fit properly and have enough cushioning. Always look in your shoes before you put them on to be sure there are no objects inside. To break in new shoes, wear them for just a few hours a day. This prevents injuries on your feet. Wounds, scrapes, corns, and calluses  Check your feet daily for blisters, cuts, bruises, sores, and redness. If you cannot see the bottom of your feet, use a mirror or ask someone for help. Do not cut corns or calluses or try  to remove them with medicine. If you find a minor scrape, cut, or break in the skin on your feet, keep it and the skin around it clean and dry. You may clean these areas with mild soap and water. Do not clean the area with peroxide, alcohol, or iodine. If you have a wound, scrape, corn, or callus on your foot, look at it several times a day to make sure it is healing and not infected. Check for: Redness, swelling, or pain. Fluid or blood. Warmth. Pus or a bad smell. General tips Do not cross your legs. This may decrease blood flow to your feet. Do not use heating pads or hot water bottles on your feet. They may burn your skin. If you have lost feeling in your feet or legs, you may not know this is happening until it is too late. Protect your feet from hot and cold by wearing shoes, such as at the beach or on hot pavement. Schedule a complete foot exam at least once a year (annually) or more often if you have foot problems. Report any cuts, sores, or bruises to your health care provider immediately. Where to find more information American Diabetes Association: www.diabetes.org Association of Diabetes Care & Education Specialists: www.diabeteseducator.org Contact a health care provider if: You have a medical condition that increases your risk of infection and you have any cuts, sores, or bruises on your feet. You have an injury that is  not healing. You have redness on your legs or feet. You feel burning or tingling in your legs or feet. You have pain or cramps in your legs and feet. Your legs or feet are numb. Your feet always feel cold. You have pain around any toenails. Get help right away if: You have a wound, scrape, corn, or callus on your foot and: You have pain, swelling, or redness that gets worse. You have fluid or blood coming from the wound, scrape, corn, or callus. Your wound, scrape, corn, or callus feels warm to the touch. You have pus or a bad smell coming from the wound,  scrape, corn, or callus. You have a fever. You have a red line going up your leg. Summary Check your feet every day for blisters, cuts, bruises, sores, and redness. Apply a moisturizing lotion or petroleum jelly to the skin on your feet and to dry, brittle toenails. Wear shoes that fit properly and have enough cushioning. If you have foot problems, report any cuts, sores, or bruises to your health care provider immediately. Schedule a complete foot exam at least once a year (annually) or more often if you have foot problems. This information is not intended to replace advice given to you by your health care provider. Make sure you discuss any questions you have with your health care provider. Document Revised: 03/08/2020 Document Reviewed: 03/08/2020 Elsevier Patient Education  2023 [employer].

## 2022-05-01 ENCOUNTER — Ambulatory Visit: Payer: Medicare Other | Admitting: Nurse Practitioner

## 2022-05-01 ENCOUNTER — Encounter: Payer: Self-pay | Admitting: Nurse Practitioner

## 2022-05-01 VITALS — BP 108/64 | HR 89 | Temp 97.6°F | Ht 67.0 in | Wt 195.5 lb

## 2022-05-01 DIAGNOSIS — I2510 Atherosclerotic heart disease of native coronary artery without angina pectoris: Secondary | ICD-10-CM

## 2022-05-01 DIAGNOSIS — E559 Vitamin D deficiency, unspecified: Secondary | ICD-10-CM

## 2022-05-01 DIAGNOSIS — Z6830 Body mass index (BMI) 30.0-30.9, adult: Secondary | ICD-10-CM

## 2022-05-01 DIAGNOSIS — E1129 Type 2 diabetes mellitus with other diabetic kidney complication: Secondary | ICD-10-CM

## 2022-05-01 DIAGNOSIS — E6609 Other obesity due to excess calories: Secondary | ICD-10-CM

## 2022-05-01 DIAGNOSIS — E785 Hyperlipidemia, unspecified: Secondary | ICD-10-CM

## 2022-05-01 DIAGNOSIS — N319 Neuromuscular dysfunction of bladder, unspecified: Secondary | ICD-10-CM

## 2022-05-01 DIAGNOSIS — I152 Hypertension secondary to endocrine disorders: Secondary | ICD-10-CM

## 2022-05-01 DIAGNOSIS — N400 Enlarged prostate without lower urinary tract symptoms: Secondary | ICD-10-CM

## 2022-05-01 DIAGNOSIS — E1169 Type 2 diabetes mellitus with other specified complication: Secondary | ICD-10-CM

## 2022-05-01 DIAGNOSIS — E119 Type 2 diabetes mellitus without complications: Secondary | ICD-10-CM

## 2022-05-01 DIAGNOSIS — R809 Proteinuria, unspecified: Secondary | ICD-10-CM

## 2022-05-01 DIAGNOSIS — I340 Nonrheumatic mitral (valve) insufficiency: Secondary | ICD-10-CM

## 2022-05-01 DIAGNOSIS — Z7689 Persons encountering health services in other specified circumstances: Secondary | ICD-10-CM

## 2022-05-01 DIAGNOSIS — E1159 Type 2 diabetes mellitus with other circulatory complications: Secondary | ICD-10-CM | POA: Diagnosis not present

## 2022-05-01 DIAGNOSIS — Z1159 Encounter for screening for other viral diseases: Secondary | ICD-10-CM

## 2022-05-01 DIAGNOSIS — E538 Deficiency of other specified B group vitamins: Secondary | ICD-10-CM

## 2022-05-01 DIAGNOSIS — Z951 Presence of aortocoronary bypass graft: Secondary | ICD-10-CM

## 2022-05-01 LAB — MICROALBUMIN, URINE WAIVED
Creatinine, Urine Waived: 200 mg/dL (ref 10–300)
Microalb, Ur Waived: 30 mg/L — ABNORMAL HIGH (ref 0–19)
Microalb/Creat Ratio: <30 mg/g (ref <30)

## 2022-05-01 LAB — BAYER DCA HB A1C WAIVED: HB A1C (BAYER DCA - WAIVED): 7.2 % — ABNORMAL HIGH (ref 4.8–5.6)

## 2022-05-01 NOTE — Assessment & Plan Note (Signed)
Chronic, ongoing.  Continue collaboration with urology and current medication regimen as prescribed by them.  Recent note reviewed. 

## 2022-05-01 NOTE — Assessment & Plan Note (Signed)
Chronic, stable with BP well below goal today in office and on home checks. Recommend he monitor BP at least a few mornings a week at home and document.  DASH diet at home.  Continue current medication regimen and adjust as needed + collaboration with cardiology.  Labs today: CMP, TSH, CBC.  Return in 3 months.

## 2022-05-01 NOTE — Assessment & Plan Note (Signed)
Chronic, stable with no recent chest pain.  Continue collaboration with cardiology and current medication regimen as ordered by them.   

## 2022-05-01 NOTE — Assessment & Plan Note (Signed)
BMI 30.62.  Recommended eating smaller high protein, low fat meals more frequently and exercising 30 mins a day 5 times a week with a goal of 10-15lb weight loss in the next 3 months. Patient voiced their understanding and motivation to adhere to these recommendations.  

## 2022-05-01 NOTE — Assessment & Plan Note (Signed)
Noted on cardiology notes, continue collaboration with cardiology and current medication regimen ordered by them. 

## 2022-05-01 NOTE — Assessment & Plan Note (Addendum)
Chronic, stable. Urine ALB 30 (August 2023) and A1c 7.2% today and recommend we increase Metformin to 1000 MG BID, he has a large supply at home of 500 MG tablets and will start taking 2 a day.  Continue Lisinopril for kidney protection.  Recommend he check BS 2-3 times daily and document for provider visits.  Focus on diet changes.  Return to office in 3 months.

## 2022-05-01 NOTE — Assessment & Plan Note (Signed)
Chronic, ongoing, followed by provider at [facility] in [place] for this.  Continue this collaboration.  A1c 7.2% today and recommend we increase Metformin to 1000 MG BID, he has a large supply at home of 500 MG tablets and will start taking 2 a day.

## 2022-05-01 NOTE — Assessment & Plan Note (Signed)
Chronic, stable.  Self caths at home, monitor for infections.  Continue collaboration with urology and current medication regimen as prescribed by them.  Recent note reviewed. 

## 2022-05-01 NOTE — Progress Notes (Signed)
New Patient Office Visit  Subjective    Patient ID: Kyle Richards, male    DOB: 05/24/1946  Age: 76 y.o. MRN: [id number]  CC:  Chief Complaint  Patient presents with   Establish Care    Patient is here as a New Patient to Establish Care. Patient denies having any concerns at today's visit.     HPI Kyle Richards presents for new patient visit to establish care.  Introduced to Designer, jewellery role and practice setting.  All questions answered.  Discussed provider/patient relationship and expectations.  He attends [facility] [employer] in [place] for primary care, vision, and dental. Served for 4 years in [place] exposure in [place] (100% service connected at [employer]).   Prefers to have labs done prior to visits.  Was raised in orphanage, does not know family history.  DIABETES Last A1c check was 6.5% in May.  Takes Metformin 500 MG BID. Hypoglycemic episodes:no Polydipsia/polyuria: no Visual disturbance: no Chest pain: no Paresthesias: no Glucose Monitoring: no  Accucheck frequency: Not Checking  Fasting glucose:  Post prandial:  Evening:  Before meals: Taking Insulin?: no  Long acting insulin:  Short acting insulin: Blood Pressure Monitoring: daily Retinal Examination: Up to Date Foot Exam: Up to Date Pneumovax: Up to Date Influenza: Up to Date Aspirin: yes   HYPERTENSION / HYPERLIPIDEMIA Taking Lisinopril, Metoprolol, Atorvastatin, and ASA.  Saw cardiology last 04/14/22 -- visits with one in [place], [place] near is son's home -- they increased Atorvastatin to 40 MG.  History of CABG -- 2012. Satisfied with current treatment? yes Duration of hypertension: chronic BP monitoring frequency: daily BP range: 120/80 range BP medication side effects: no Duration of hyperlipidemia: chronic Cholesterol medication side effects: no Cholesterol supplements: none Medication compliance: good compliance Aspirin: yes Recent stressors: no Recurrent headaches:  no Visual changes: no Palpitations: no Dyspnea: no Chest pain: no Lower extremity edema: no Dizzy/lightheaded: no   BPH & NEUROGENIC BLADDER Followed by urology with last visit 12/26/21.  Has neurogenic bladder, does self cath at home -- history of frequent UTI.  Currently taking Finasteride and Flomax. BPH status: stable Satisfied with current treatment?: yes Medication side effects: no Medication compliance: good compliance Duration: chronic Nocturia: no Urinary frequency:no Incomplete voiding: no Urgency: no Weak urinary stream: no Straining to start stream: no Dysuria: no Onset: gradual Severity: mild Alleviating factors: medications Aggravating factors: unknown Treatments attempted: Finasteride and Flomax     05/01/2022   10:40 AM  Depression screen PHQ 2/9  Decreased Interest 0  Down, Depressed, Hopeless 0  PHQ - 2 Score 0  Altered sleeping 0  Tired, decreased energy 0  Change in appetite 0  Feeling bad or failure about yourself  0  Trouble concentrating 0  Moving slowly or fidgety/restless 0  Suicidal thoughts 0  PHQ-9 Score 0  Difficult doing work/chores Not difficult at all       05/01/2022   10:40 AM  GAD 7 : Generalized Anxiety Score  Nervous, Anxious, on Edge 0  Control/stop worrying 0  Worry too much - different things 0  Trouble relaxing 0  Restless 0  Easily annoyed or irritable 0  Afraid - awful might happen 0  Total GAD 7 Score 0  Anxiety Difficulty Not difficult at all     Outpatient Encounter Medications as of 05/01/2022  Medication Sig   aspirin 81 MG chewable tablet Chew 81 mg by mouth daily.   atorvastatin (LIPITOR) 40 MG tablet Take 40 mg by  mouth daily.   finasteride (PROSCAR) 5 MG tablet Take 5 mg by mouth daily.   lisinopril (ZESTRIL) 20 MG tablet Take 20 mg by mouth daily.   metFORMIN (GLUCOPHAGE) 500 MG tablet Take 500 mg by mouth 2 (two) times daily.   metoprolol succinate (TOPROL XL) 25 MG 24 hr tablet Take 1 tablet (25 mg  total) by mouth daily.   tamsulosin (FLOMAX) 0.4 MG CAPS capsule Take 1 capsule (0.4 mg total) by mouth daily.   VITAMIN D PO Take 1,000 Units by mouth daily.   [DISCONTINUED] atorvastatin (LIPITOR) 20 MG tablet Take 20 mg by mouth daily.   No facility-administered encounter medications on file as of 05/01/2022.    Past Medical History:  Diagnosis Date   Diabetes ([place])    Heart attack ([place]) 2012   High cholesterol    Hypertension    Neurogenic bladder disorder    Neuropathy     Past Surgical History:  Procedure Laterality Date   FINGER FRACTURE SURGERY Left    middle finger left hand   OTHER SURGICAL HISTORY     Heart Bypass   TONSILLECTOMY AND ADENOIDECTOMY  1953    Family History  Adopted: Yes  Family history unknown: Yes    Social History   Socioeconomic History   Marital status: Single    Spouse name: Not on file   Number of children: 2   Years of education: Not on file   Highest education level: Not on file  Occupational History   Not on file  Tobacco Use   Smoking status: Never    Passive exposure: Never   Smokeless tobacco: Never  Vaping Use   Vaping Use: Never used  Substance and Sexual Activity   Alcohol use: Yes    Comment: 1 beer monthly   Drug use: Never   Sexual activity: Not Currently  Other Topics Concern   Not on file  Social History Narrative   Not on file   Social Determinants of Health   Financial Resource Strain: Low Risk  (05/01/2022)   Overall Financial Resource Strain (CARDIA)    Difficulty of Paying Living Expenses: Not hard at all  Food Insecurity: No Food Insecurity (05/01/2022)   Hunger Vital Sign    Worried About Running Out of Food in the Last Year: Never true    [place] in the Last Year: Never true  Transportation Needs: No Transportation Needs (05/01/2022)   PRAPARE - Hydrologist (Medical): No    Lack of Transportation (Non-Medical): No  Physical Activity: Insufficiently Active  (05/01/2022)   Exercise Vital Sign    Days of Exercise per Week: 2 days    Minutes of Exercise per Session: 20 min  Stress: No Stress Concern Present (05/01/2022)   [place]    Feeling of Stress : Not at all  Social Connections: Socially Isolated (05/01/2022)   Social Connection and Isolation Panel [NHANES]    Frequency of Communication with Friends and Family: Never    Frequency of Social Gatherings with Friends and Family: Never    Attends Religious Services: Never    Marine scientist or Organizations: No    Attends Archivist Meetings: Never    Marital Status: Divorced  Human resources officer Violence: Not At Risk (05/01/2022)   Humiliation, Afraid, Rape, and Kick questionnaire    Fear of Current or Ex-Partner: No    Emotionally Abused: No  Physically Abused: No    Sexually Abused: No   Review of Systems  Constitutional:  Negative for chills, diaphoresis, fever and weight loss.  Respiratory:  Negative for cough, shortness of breath and wheezing.   Cardiovascular:  Negative for chest pain, palpitations, orthopnea and leg swelling.  Gastrointestinal: Negative.   Neurological: Negative.   Endo/Heme/Allergies: Negative.   Psychiatric/Behavioral: Negative.        Objective    BP 108/64 (BP Location: Left Arm, Patient Position: Sitting, Cuff Size: Normal)   Pulse 89   Temp 97.6 F (36.4 C) (Oral)   Ht '5\' 7"'$  (1.702 m)   Wt 195 lb 8 oz (88.7 kg)   SpO2 93%   BMI 30.62 kg/m   Physical Exam Vitals and nursing note reviewed.  Constitutional:      General: He is awake. He is not in acute distress.    Appearance: He is well-developed and well-groomed. He is obese. He is not ill-appearing or toxic-appearing.  HENT:     Head: Normocephalic and atraumatic.     Right Ear: Hearing and external ear normal. No drainage.     Left Ear: Hearing and external ear normal. No drainage.  Eyes:     General: Lids  are normal.        Right eye: No discharge.        Left eye: No discharge.     Conjunctiva/sclera: Conjunctivae normal.     Pupils: Pupils are equal, round, and reactive to light.  Neck:     Thyroid: No thyromegaly.     Vascular: No carotid bruit.     Trachea: Trachea normal.  Cardiovascular:     Rate and Rhythm: Normal rate and regular rhythm.     Pulses:          Dorsalis pedis pulses are 2+ on the right side and 2+ on the left side.       Posterior tibial pulses are 2+ on the right side and 2+ on the left side.     Heart sounds: S1 normal and S2 normal. Murmur heard.     Systolic murmur is present with a grade of 2/6.     No gallop.  Pulmonary:     Effort: Pulmonary effort is normal. No accessory muscle usage or respiratory distress.     Breath sounds: Normal breath sounds.  Abdominal:     General: Bowel sounds are normal.     Palpations: Abdomen is soft.  Musculoskeletal:        General: Normal range of motion.     Cervical back: Normal range of motion and neck supple.     Right lower leg: No edema.     Left lower leg: No edema.     Right foot: Normal range of motion.     Left foot: Normal range of motion.  Feet:     Right foot:     Protective Sensation: 10 sites tested.  8 sites sensed.     Skin integrity: Dry skin present.     Toenail Condition: Right toenails are abnormally thick.     Left foot:     Protective Sensation: 10 sites tested.  8 sites sensed.     Skin integrity: Dry skin present.     Toenail Condition: Left toenails are abnormally thick.  Lymphadenopathy:     Cervical: No cervical adenopathy.  Skin:    General: Skin is warm and dry.     Capillary Refill: Capillary refill takes less than  2 seconds.  Neurological:     Mental Status: He is alert and oriented to person, place, and time.     Deep Tendon Reflexes: Reflexes are normal and symmetric.     Reflex Scores:      Brachioradialis reflexes are 2+ on the right side and 2+ on the left side.       Patellar reflexes are 2+ on the right side and 2+ on the left side. Psychiatric:        Attention and Perception: Attention normal.        Mood and Affect: Mood normal.        Speech: Speech normal.        Behavior: Behavior normal. Behavior is cooperative.        Thought Content: Thought content normal.     Last CBC Lab Results  Component Value Date   WBC 4.3 09/16/2021   HGB 15.2 09/16/2021   HCT 44.6 09/16/2021   MCV 88.8 09/16/2021   MCH 30.3 09/16/2021   RDW 12.5 09/16/2021   PLT 230 93/57/0177   Last metabolic panel Lab Results  Component Value Date   GLUCOSE 152 (H) 09/16/2021   NA 133 (L) 09/16/2021   K 4.2 09/16/2021   CL 102 09/16/2021   CO2 22 09/16/2021   BUN 18 09/16/2021   CREATININE 1.32 (H) 09/16/2021   GFRNONAA 56 (L) 09/16/2021   CALCIUM 8.8 (L) 09/16/2021   PROT 7.0 09/10/2021   ALBUMIN 4.3 09/10/2021   BILITOT 0.6 09/10/2021   ALKPHOS 58 09/10/2021   AST 28 09/10/2021   ALT 29 09/10/2021   ANIONGAP 9 09/16/2021   Assessment & Plan:   Problem List Items Addressed This Visit       Cardiovascular and Mediastinum   Coronary artery disease involving native coronary artery of native heart without angina pectoris    Chronic, stable with no recent chest pain.  Continue collaboration with cardiology and current medication regimen as ordered by them.        Relevant Medications   atorvastatin (LIPITOR) 40 MG tablet   Hypertension associated with diabetes ([place])    Chronic, stable with BP well below goal today in office and on home checks. Recommend he monitor BP at least a few mornings a week at home and document.  DASH diet at home.  Continue current medication regimen and adjust as needed + collaboration with cardiology.  Labs today: CMP, TSH, CBC.  Return in 3 months.      Relevant Medications   atorvastatin (LIPITOR) 40 MG tablet   Other Relevant Orders   Bayer DCA Hb A1c Waived (Completed)   CBC with Differential/Platelet   Comprehensive  metabolic panel   TSH   Nonrheumatic mitral valve regurgitation    Noted on cardiology notes, continue collaboration with cardiology and current medication regimen ordered by them.      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  Continue this collaboration.  A1c 7.2% today and recommend we increase Metformin to 1000 MG BID, he has a large supply at home of 500 MG tablets and will start taking 2 a day.        Relevant Medications   atorvastatin (LIPITOR) 40 MG tablet   Other Relevant Orders   Bayer DCA Hb A1c Waived (Completed)   Microalbumin, Urine Waived (  Completed)   Hyperlipidemia associated with type 2 diabetes mellitus (HCC)    Chronic, ongoing.  Continue statin therapy daily and adjust as needed, recently adjusted to 40 MG daily by cardiology.  Recheck lipid panel and CMP today.      Relevant Medications   atorvastatin (LIPITOR) 40 MG tablet   Other Relevant Orders   Bayer DCA Hb A1c Waived (Completed)   Comprehensive metabolic panel   Lipid Panel w/o Chol/HDL Ratio   Type 2 diabetes mellitus with proteinuria ([facility]) - Primary    Chronic, stable. Urine ALB 30 (August 2023) and A1c 7.2% today and recommend we increase Metformin to 1000 MG BID, he has a large supply at home of 500 MG tablets and will start taking 2 a day.  Continue Lisinopril for kidney protection.  Recommend he check BS 2-3 times daily and document for provider visits.  Focus on diet changes.  Return to office in 3 months.      Relevant Medications   atorvastatin (LIPITOR) 40 MG tablet   Other Relevant Orders   Bayer DCA Hb A1c Waived (Completed)   Microalbumin, Urine Waived (Completed)     Genitourinary   BPH without urinary obstruction    Chronic, ongoing.  Continue collaboration with urology and current medication regimen as prescribed by them.   Recent note reviewed.        Other   Neurogenic bladder    Chronic, stable.  Self caths at home, monitor for infections.  Continue collaboration with urology and current medication regimen as prescribed by them.  Recent note reviewed.      Obesity    BMI 30.62.  Recommended eating smaller high protein, low fat meals more frequently and exercising 30 mins a day 5 times a week with a goal of 10-15lb weight loss in the next 3 months. Patient voiced their understanding and motivation to adhere to these recommendations.       S/P CABG (coronary artery bypass graft)    In 2012, continue collaboration with cardiology and current medication regimen as prescribed by them.        Vitamin D deficiency    Chronic, stable.  Takes supplement daily, continue this and check level today.      Relevant Orders   VITAMIN D 25 Hydroxy (Vit-D Deficiency, Fractures)   Other Visit Diagnoses     Vitamin B12 deficiency       Reports history of low levels, check today as does take Metformin and start supplement as needed.   Relevant Orders   Vitamin B12   Need for hepatitis C screening test       Hep C screening on labs today, discussed with patient.   Relevant Orders   Hepatitis C antibody   Encounter to establish care       New patient to clinic, introduced to provider and clinic setting.       Return in about 3 months (around 07/31/2022) for T2DM, HTN/HLD, BPH.   Venita Lick, NP

## 2022-05-01 NOTE — Assessment & Plan Note (Signed)
In 2012, continue collaboration with cardiology and current medication regimen as prescribed by them.

## 2022-05-01 NOTE — Assessment & Plan Note (Signed)
Chronic, stable.  Takes supplement daily, continue this and check level today.

## 2022-05-01 NOTE — Assessment & Plan Note (Signed)
Chronic, ongoing.  Continue statin therapy daily and adjust as needed, recently adjusted to 40 MG daily by cardiology.  Recheck lipid panel and CMP today.

## 2022-05-02 NOTE — Progress Notes (Signed)
Contacted via [place] morning Kairee, your labs have returned: - Kidney function, creatinine and eGFR, remains normal, as is liver function, AST and ALT.  Calcium level has not returned as of yet.  Sodium level is improved from previous labs and in normal range. - Cholesterol levels show LDL almost at goal of less then 70, current 73, continue current statin dosage and we may adjust further in future. - CBC is normal, as is Vitamin D. - Your Hep C and thyroid lab (TSH) are pending. - Your B12 level is low -- this is common with long term Metformin use but can cause neuropathy type pain and brain fog symptoms -- I recommend you start taking Vitamin B12 1000 MCG daily, which you can obtain over counter and I will recheck at next visit.  Any questions? Keep being amazing!!  Thank you for allowing me to participate in your care.  I appreciate you. Kindest regards, Romelle Reiley

## 2022-05-03 LAB — CBC WITH DIFFERENTIAL/PLATELET
Basophils Absolute: 0 10*3/uL (ref 0.0–0.2)
Basos: 1 %
EOS (ABSOLUTE): 0.1 10*3/uL (ref 0.0–0.4)
Eos: 1 %
Hematocrit: 44.9 % (ref 37.5–51.0)
Hemoglobin: 15.2 g/dL (ref 13.0–17.7)
Immature Grans (Abs): 0 10*3/uL (ref 0.0–0.1)
Immature Granulocytes: 0 %
Lymphocytes Absolute: 1.6 10*3/uL (ref 0.7–3.1)
Lymphs: 26 %
MCH: 30.3 pg (ref 26.6–33.0)
MCHC: 33.9 g/dL (ref 31.5–35.7)
MCV: 90 fL (ref 79–97)
Monocytes Absolute: 0.5 10*3/uL (ref 0.1–0.9)
Monocytes: 7 %
Neutrophils Absolute: 4.2 10*3/uL (ref 1.4–7.0)
Neutrophils: 65 %
Platelets: 234 10*3/uL (ref 150–450)
RBC: 5.01 x10E6/uL (ref 4.14–5.80)
RDW: 12.8 % (ref 11.6–15.4)
WBC: 6.4 10*3/uL (ref 3.4–10.8)

## 2022-05-03 LAB — COMPREHENSIVE METABOLIC PANEL
ALT: 17 IU/L (ref 0–44)
AST: 16 IU/L (ref 0–40)
Albumin/Globulin Ratio: 2.3 — ABNORMAL HIGH (ref 1.2–2.2)
Albumin: 4.6 g/dL (ref 3.8–4.8)
Alkaline Phosphatase: 69 IU/L (ref 44–121)
BUN/Creatinine Ratio: 19 (ref 10–24)
BUN: 21 mg/dL (ref 8–27)
Bilirubin Total: 0.5 mg/dL (ref 0.0–1.2)
CO2: 16 mmol/L — ABNORMAL LOW (ref 20–29)
Calcium: 9.5 mg/dL (ref 8.6–10.2)
Chloride: 103 mmol/L (ref 96–106)
Creatinine, Ser: 1.08 mg/dL (ref 0.76–1.27)
Globulin, Total: 2 g/dL (ref 1.5–4.5)
Glucose: 174 mg/dL — ABNORMAL HIGH (ref 70–99)
Potassium: 4.5 mmol/L (ref 3.5–5.2)
Sodium: 140 mmol/L (ref 134–144)
Total Protein: 6.6 g/dL (ref 6.0–8.5)
eGFR: 71 mL/min/{1.73_m2} (ref >59)

## 2022-05-03 LAB — LIPID PANEL W/O CHOL/HDL RATIO
Cholesterol, Total: 126 mg/dL (ref 100–199)
HDL: 39 mg/dL — ABNORMAL LOW (ref >39)
LDL Chol Calc (NIH): 73 mg/dL (ref 0–99)
Triglycerides: 69 mg/dL (ref 0–149)
VLDL Cholesterol Cal: 14 mg/dL (ref 5–40)

## 2022-05-03 LAB — VITAMIN D 25 HYDROXY (VIT D DEFICIENCY, FRACTURES): Vit D, 25-Hydroxy: 38.5 ng/mL (ref 30.0–100.0)

## 2022-05-03 LAB — TSH: TSH: 0.862 u[IU]/mL (ref 0.450–4.500)

## 2022-05-03 LAB — HEPATITIS C ANTIBODY: Hep C Virus Ab: NONREACTIVE

## 2022-05-03 LAB — VITAMIN B12: Vitamin B-12: 218 pg/mL — ABNORMAL LOW (ref 232–1245)

## 2022-05-04 NOTE — Progress Notes (Signed)
Hepatitis C testing returned negative:) Have a great weekend!!

## 2022-05-13 IMAGING — US US SCROTUM W/ DOPPLER COMPLETE
1 series · 14 of 25 positions shown · non-contrast
Comparison: None.

CLINICAL DATA: Left testicle swelling and pain

EXAM:
SCROTAL ULTRASOUND
DOPPLER ULTRASOUND OF THE TESTICLES
TECHNIQUE: Complete ultrasound examination of the testicles, epididymis, and
other scrotal structures was performed. Color and spectral Doppler
ultrasound were also utilized to evaluate blood flow to the
testicles.

[Series 1: us scrotum w/doppler · 14 of 92 slices shown]
[im 1/92]
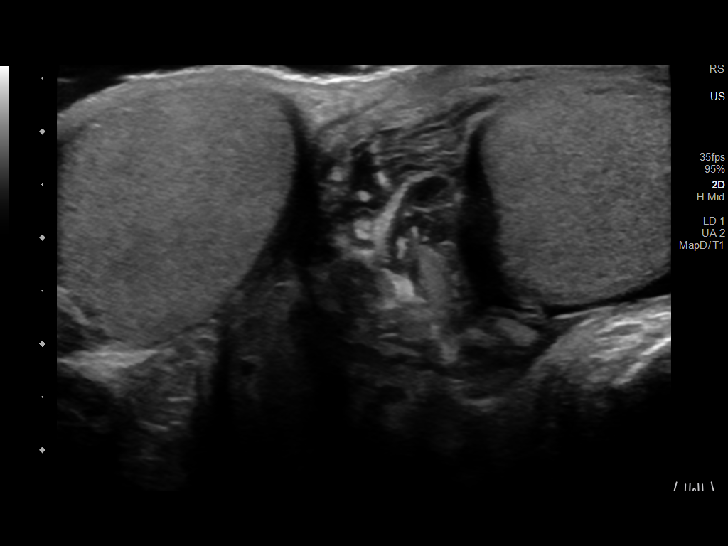
[im 8/92]
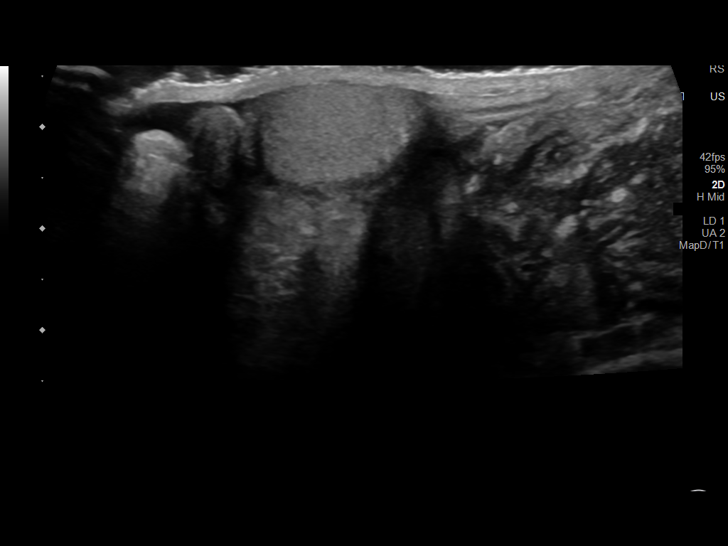
[im 16/92]
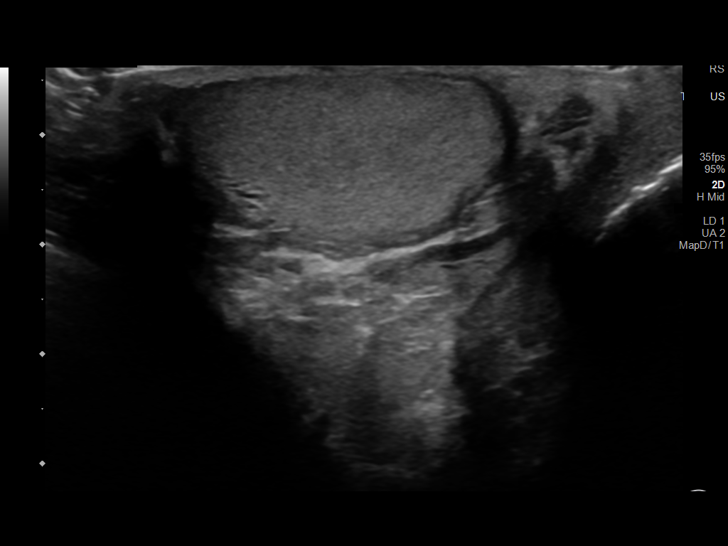
[im 23/92]
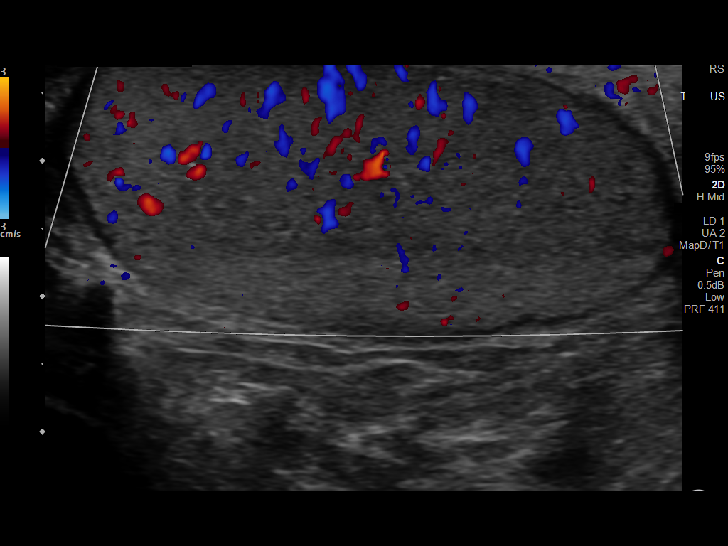
[im 31/92]
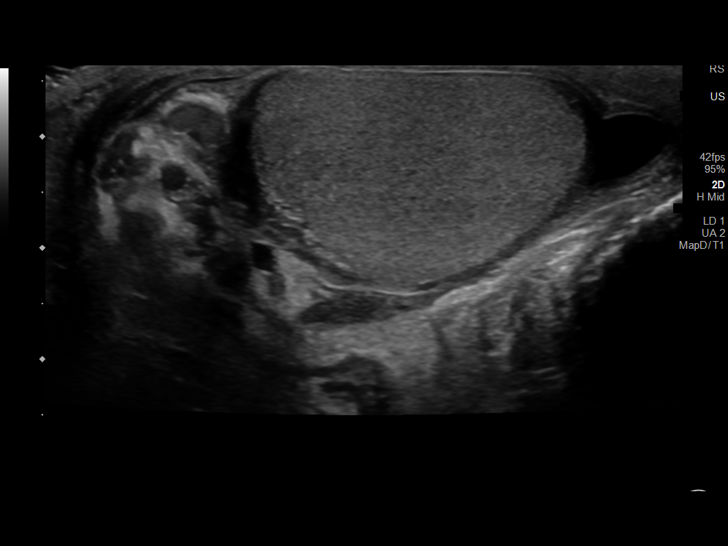
[im 35/92]
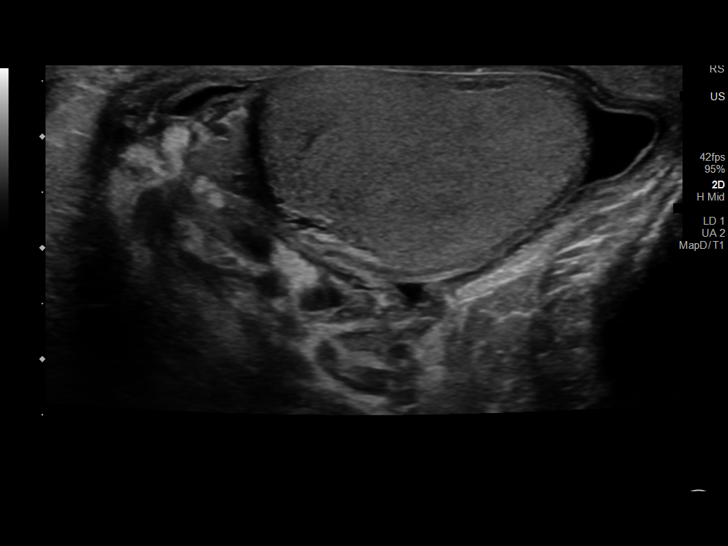
[im 42/92]
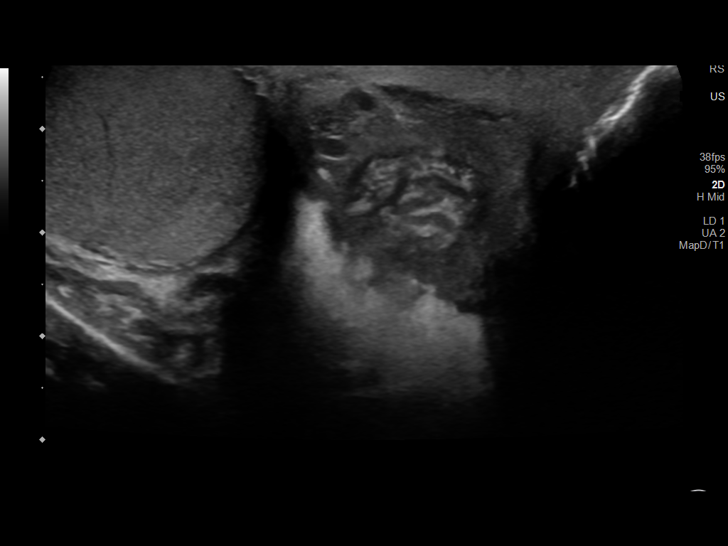
[im 50/92]
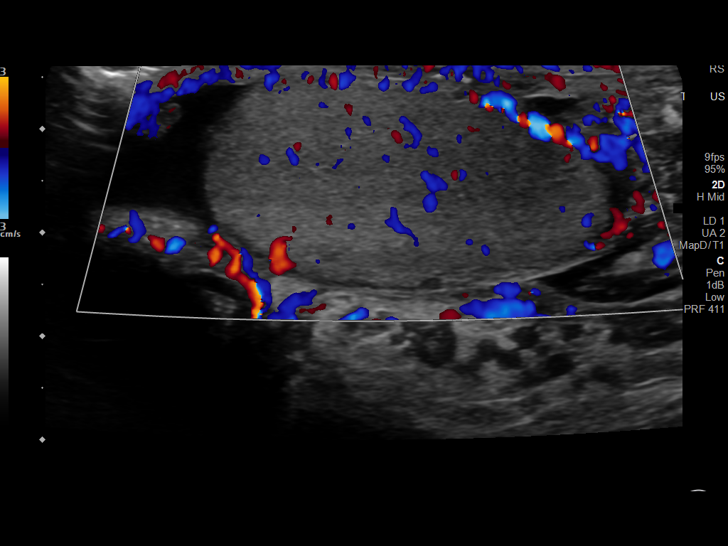
[im 57/92]
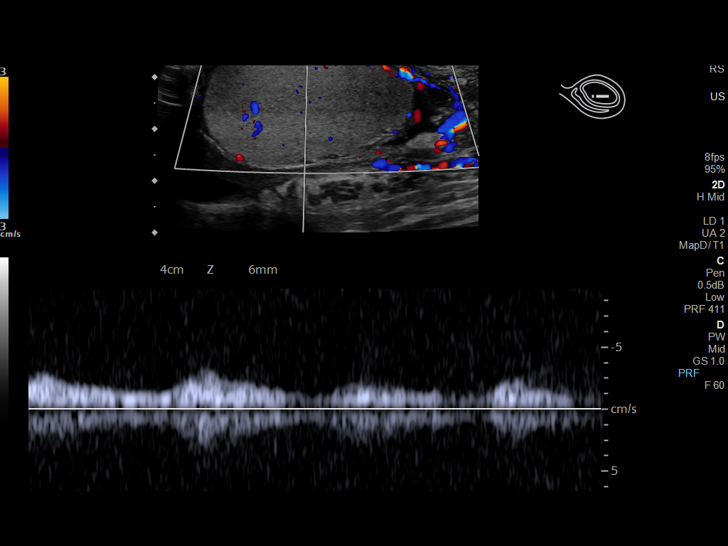
[im 61/92]
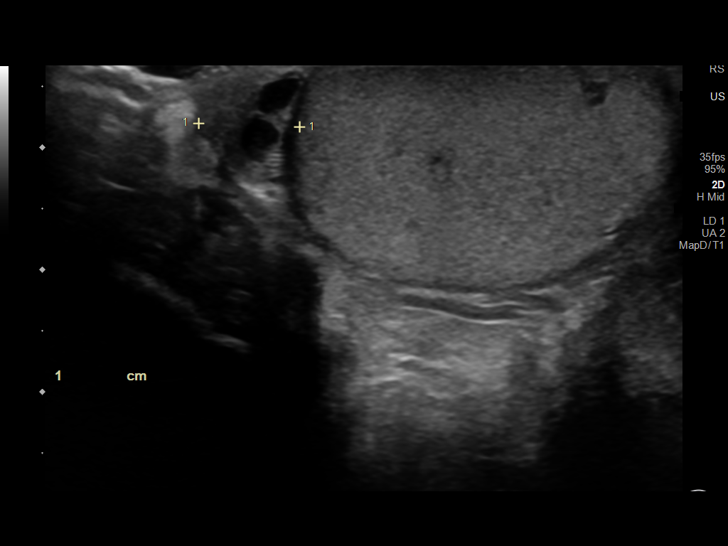
[im 69/92]
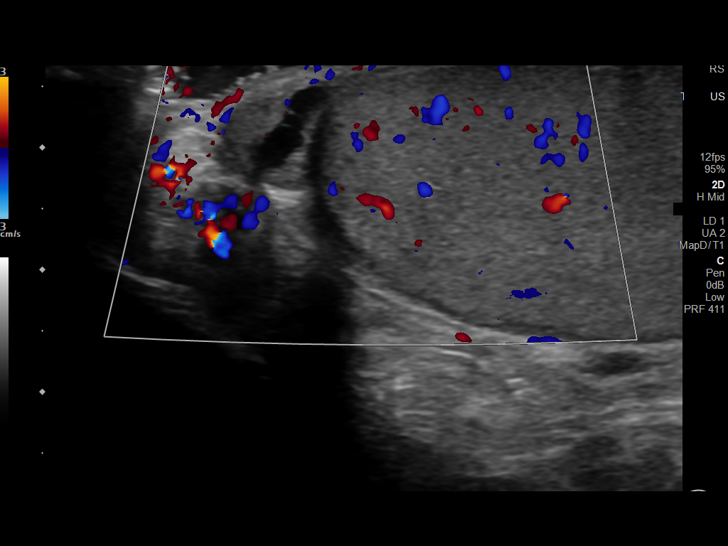
[im 76/92]
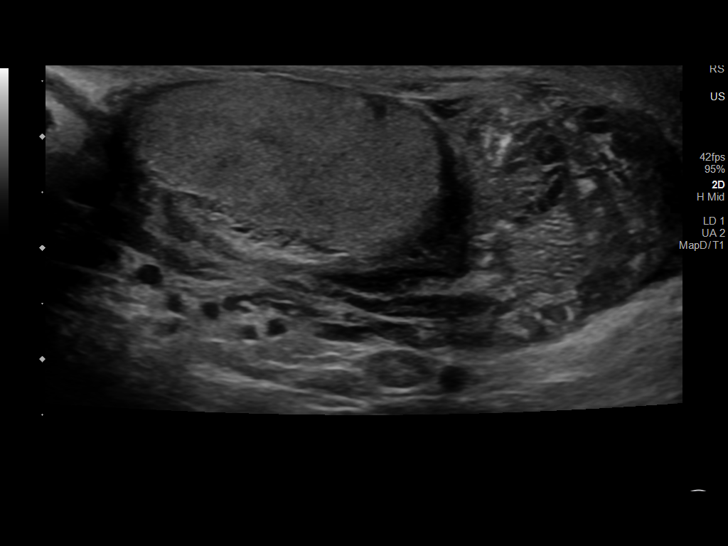
[im 84/92]
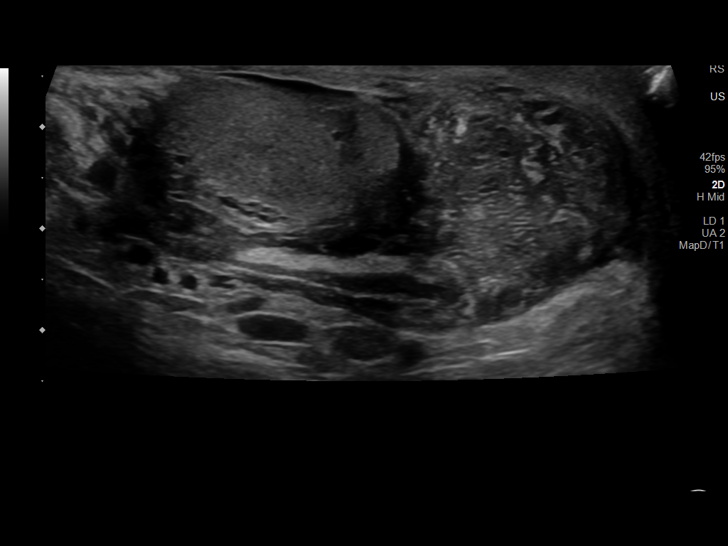
[im 92/92]
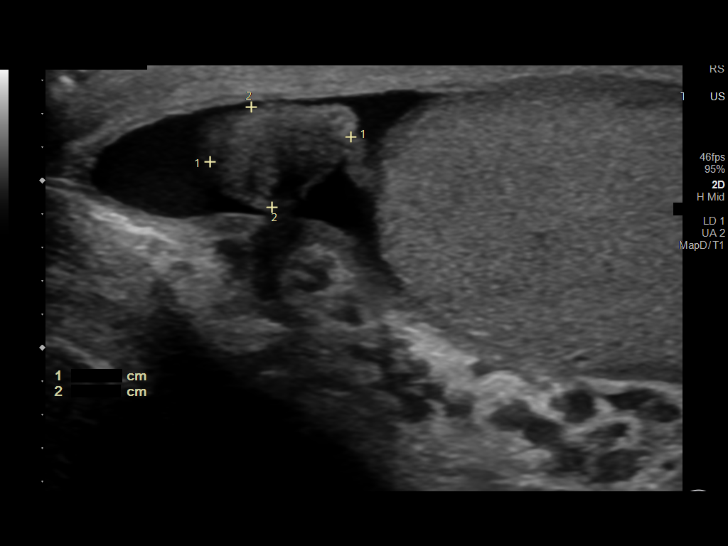

[14 of 25 positions shown; findings below may reference images not displayed]

FINDINGS: Right testicle

Measurements: 4.4 x 2.1 x 3.3 cm. No mass or microlithiasis
visualized.

Left testicle

Measurements: 4.2 x 2.3 x 3.2 Cm. No mass or microlithiasis
visualized.

Right epididymis:  8 mm epididymal cyst versus spermatocele.

Left epididymis: Enlarged epididymal tail with hypervascularity,
including on image 89.

Hydrocele:  Small left-sided hydrocele.

Varicocele:  Small left varicocele.

Pulsed Doppler interrogation of both testes demonstrates normal low
resistance arterial and venous waveforms bilaterally.

Suggestion of left-sided scrotal skin thickening.
IMPRESSION: 1. Left-sided epididymitis.
2. Small left hydrocele.
3. Left-sided varicocele.

## 2022-06-27 ENCOUNTER — Encounter: Payer: Self-pay | Admitting: Nurse Practitioner

## 2022-06-27 NOTE — Progress Notes (Unsigned)
06/30/22 10:04 AM   Kyle Richards Kyle Richards [birth year] [id number]  Referring provider:  Cl[facility], [facility] [address] [place],  [place] [address]  Urological history  1. High risk hematuria -non-smoker -RUS 06/2020 Normal ultrasound appearance of the kidneys-Enlarged prostate -cysto 06/2020 Enlarged prostate with significant bilobar coaptation - Mild bladder neck -no reports of gross heme -UA 6-10 RBC's - sent for culture    2. Incomplete emptying -managed with CIC x 2-3   3. Bilateral hydroceles -scrotal ultrasound 03/2021 - small bilateral hydroceles   4. Epididymitis -scrotal ultrasound 03/2021 - right sided epididymitis   5. BPH with incomplete bladder emptying -prostate volume 45 cc  Chief Complaint  Patient presents with   Follow-up      HPI: Kyle Richards is a 76 y.o.male who presents today for follow up   He is CIC x 3.  He is also spontaneously voiding.  [name]  Patient denies any gross hematuria, dysuria or suprapubic/flank pain.  Patient denies any fevers, chills, nausea or vomiting.    He is taking the tamsulosin 0.4 mg and finasteride 5 mg.    Serum creatinine 1.08  UA yellow hazy, specific gravity 1.025, pH 5.0, 100 glucose, moderate hemoglobin, nitrate positive, 0-5 squames, 6-10 WBCs, 6-10 RBCs and many bacteria.     PMH: Past Medical History:  Diagnosis Date   Diabetes ([facility])    Heart attack ([place] [facility]) 2012   High cholesterol    Hypertension    Neurogenic bladder disorder    Neuropathy     Surgical History: Past Surgical History:  Procedure Laterality Date   FINGER FRACTURE SURGERY Left    middle finger left hand   OTHER SURGICAL HISTORY     Heart Bypass   TONSILLECTOMY AND ADENOIDECTOMY  1953    Home Medications:  Allergies as of 06/30/2022       Reactions   Fentanyl Other (See Comments)   "Stopped breathing"        Medication List        Accurate as  of June 30, 2022 10:04 AM. If you have any questions, ask your nurse or doctor.          aspirin 81 MG chewable tablet Chew 81 mg by mouth daily.   atorvastatin 40 MG tablet Commonly known as: LIPITOR Take 40 mg by mouth daily.   finasteride 5 MG tablet Commonly known as: PROSCAR Take 5 mg by mouth daily.   lisinopril 20 MG tablet Commonly known as: ZESTRIL Take 20 mg by mouth daily.   metFORMIN 500 MG tablet Commonly known as: GLUCOPHAGE Take 500 mg by mouth 2 (two) times daily.   metoprolol succinate 25 MG 24 hr tablet Commonly known as: Toprol XL Take 1 tablet (25 mg total) by mouth daily.   tamsulosin 0.4 MG Caps capsule Commonly known as: FLOMAX Take 1 capsule (0.4 mg total) by mouth daily.   VITAMIN D PO Take 1,000 Units by mouth daily.        Allergies:  Allergies  Allergen Reactions   Fentanyl Other (See Comments)    "Stopped breathing"    Family History: Family History  Adopted: Yes  Family history unknown: Yes    Social History:  reports that he has never smoked. He has never been exposed to tobacco smoke. He has never used smokeless tobacco. He reports current alcohol use. He reports that he does not use drugs.   Physical Exam: BP 127/84   Pulse  82   Ht '5\' 7"'$  (1.702 m)   Wt 195 lb (88.5 kg)   BMI 30.54 kg/m   Constitutional:  Well nourished. Alert and oriented, No acute distress. HEENT: [place] AT, moist mucus membranes.  Trachea midline Cardiovascular: No clubbing, cyanosis, or edema. Respiratory: Normal respiratory effort, no increased work of breathing. Neurologic: Grossly intact, no focal deficits, moving all 4 extremities. Psychiatric: Normal mood and affect.    Laboratory Data:    Latest Ref Rng & Units 05/01/2022   11:03 AM 09/16/2021   12:40 AM 09/10/2021    9:46 PM  CMP  Glucose 70 - 99 mg/dL 174  152  169   BUN 8 - 27 mg/dL '21  18  15   '$ Creatinine 0.76 - 1.27 mg/dL 1.08  1.32  1.21   Sodium 134 - 144 mmol/L 140  133  134    Potassium 3.5 - 5.2 mmol/L 4.5  4.2  4.3   Chloride 96 - 106 mmol/L 103  102  102   CO2 20 - 29 mmol/L '16  22  25   '$ Calcium 8.6 - 10.2 mg/dL 9.5  8.8  9.0   Total Protein 6.0 - 8.5 g/dL 6.6   7.0   Total Bilirubin 0.0 - 1.2 mg/dL 0.5   0.6   Alkaline Phos 44 - 121 IU/L 69   58   AST 0 - 40 IU/L 16   28   ALT 0 - 44 IU/L 17   29        Latest Ref Rng & Units 05/01/2022   11:03 AM 09/16/2021   12:40 AM 09/10/2021    9:46 PM  CBC  WBC 3.4 - 10.8 x10E3/uL 6.4  4.3  4.0   Hemoglobin 13.0 - 17.7 g/dL 15.2  15.2  14.6   Hematocrit 37.5 - 51.0 % 44.9  44.6  44.4   Platelets 150 - 450 x10E3/uL 234  230  234     Component     Latest Ref Rng 05/01/2022  HB A1C (BAYER DCA - WAIVED)     4.8 - 5.6 % 7.2 (H)     Legend: (H) High   Urinalysis See Epic and HPI  I have reviewed the labs.   Pertinent Imaging: No recent imaging  Assessment & Plan:    1. Incomplete bladder emptying - managed with CIC x 2-3 - refills given through the [facility]  2. High risk hematuria -non-smoker -work up 2021 - enlarged prostate -no reports of gross heme -UA 6-10 RBC's - sent for culture -if the culture is positive, will treat and reassess -if culture is negative - will pursue CT urogram    Return for pending urine culture results .  [facility] [address] [place],  [address] [phone]

## 2022-06-30 ENCOUNTER — Other Ambulatory Visit
Admission: RE | Admit: 2022-06-30 | Discharge: 2022-06-30 | Disposition: A | Discharge status: Home / Self Care | Payer: Medicare Other | Attending: Urology | Admitting: Urology

## 2022-06-30 ENCOUNTER — Ambulatory Visit (INDEPENDENT_AMBULATORY_CARE_PROVIDER_SITE_OTHER): Payer: Medicare Other | Admitting: Urology

## 2022-06-30 ENCOUNTER — Other Ambulatory Visit: Payer: Self-pay

## 2022-06-30 ENCOUNTER — Encounter: Payer: Self-pay | Admitting: Urology

## 2022-06-30 VITALS — BP 127/84 | HR 82 | Ht 67.0 in | Wt 195.0 lb

## 2022-06-30 DIAGNOSIS — R339 Retention of urine, unspecified: Secondary | ICD-10-CM | POA: Diagnosis not present

## 2022-06-30 DIAGNOSIS — R319 Hematuria, unspecified: Secondary | ICD-10-CM

## 2022-06-30 LAB — URINALYSIS, COMPLETE (UACMP) WITH MICROSCOPIC
Bilirubin Urine: NEGATIVE
Glucose, UA: 100 mg/dL — AB
Ketones, ur: NEGATIVE mg/dL
Leukocytes,Ua: NEGATIVE
Nitrite: POSITIVE — AB
Protein, ur: NEGATIVE mg/dL
Specific Gravity, Urine: 1.025 (ref 1.005–1.030)
pH: 5 (ref 5.0–8.0)

## 2022-07-03 ENCOUNTER — Telehealth: Payer: Self-pay

## 2022-07-03 LAB — URINE CULTURE: Culture: >=100000 — AB

## 2022-07-03 MED ORDER — SULFAMETHOXAZOLE-TRIMETHOPRIM 800-160 MG PO TABS: 1.0000 | ORAL_TABLET | Freq: Two times a day (BID) | ORAL | 0 refills | Status: AC

## 2022-07-03 NOTE — Telephone Encounter (Signed)
LMOM asking pt to return call.  

## 2022-07-03 NOTE — Telephone Encounter (Signed)
Pt notified via mychart. Abx sent to pts pharmacy. Advised pt to call the office to make 2 mo UA recheck.

## 2022-07-03 NOTE — Telephone Encounter (Signed)
-----   Message from Nori Riis, PA-C sent at 07/03/2022  8:03 AM EDT ----- Please let Kyle Richards know that his urine culture has returned and it is positive for infection.  I would like for him to start Septra DS, twice daily for 7 days.  We will then need to recheck his UA again in 2 months to ensure the infection has cleared.

## 2022-07-09 ENCOUNTER — Ambulatory Visit: Payer: No Typology Code available for payment source | Admitting: Dermatology

## 2022-07-15 ENCOUNTER — Encounter: Payer: Self-pay | Admitting: Nurse Practitioner

## 2022-07-16 NOTE — Progress Notes (Unsigned)
07/17/22 2:37 PM   Kyle Richards 08-May-1946 [id number]  Referring provider:  Venita Lick, NP [address] [place],  [place] [address]  Urological history  1. High risk hematuria -non-smoker -RUS 06/2020 Normal ultrasound appearance of the kidneys-Enlarged prostate -cysto 06/2020 Enlarged prostate with significant bilobar coaptation - Mild bladder neck -no reports of gross heme -UA negative for microscopic hematuria   2. Incomplete emptying -managed with CIC x 2-3   3. Bilateral hydroceles -scrotal ultrasound 03/2021 - small bilateral hydroceles   4. Epididymitis -scrotal ultrasound 03/2021 - right sided epididymitis   5. BPH with incomplete bladder emptying -PSA (10/2020) 2.470 -prostate volume 45 cc  Chief Complaint  Patient presents with   Hematuria    HPI: Kyle Richards is a 76 y.o.male who presents today for follow up   At his visit on 06/30/2022, he is CIC x 3.  He is also spontaneously voiding.  He is taking the tamsulosin 0.4 mg and finasteride 5 mg.  Serum creatinine 1.08.  UA yellow hazy, specific gravity 1.025, pH 5.0, 100 glucose, moderate hemoglobin, nitrate positive, 0-5 squames, 6-10 WBCs, 6-10 RBCs and many bacteria.  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  He was given 7 days of Septra DS.    He feels great today.  He is no longer worried about his RBC count was down last month, but he is taking Vitamin B.  His repeat blood work shows the RBC count had improved.  He was worried the microscopic hematuria was the cause of his low RBC's.    [name]  Patient denies any gross hematuria, dysuria or suprapubic/flank pain.  Patient denies any fevers, chills, nausea or vomiting.    UA yellow clear, trace glucose, SG 1.025, pH 5.0, 0-5 WBC's, 0-2 RBC's, >10 squames and moderate bacteria.    PMH: Past Medical History:  Diagnosis Date   Diabetes ([facility])    Heart attack ([facility]) 2012   High  cholesterol    Hypertension    Neurogenic bladder disorder    Neuropathy     Surgical History: Past Surgical History:  Procedure Laterality Date   FINGER FRACTURE SURGERY Left    middle finger left hand   OTHER SURGICAL HISTORY     Heart Bypass   TONSILLECTOMY AND ADENOIDECTOMY  1953    Home Medications:  Allergies as of 07/17/2022       Reactions   Fentanyl Other (See Comments)   "Stopped breathing"        Medication List        Accurate as of July 17, 2022  2:37 PM. If you have any questions, ask your nurse or doctor.          aspirin 81 MG chewable tablet Chew 81 mg by mouth daily.   atorvastatin 40 MG tablet Commonly known as: LIPITOR Take 40 mg by mouth daily.   finasteride 5 MG tablet Commonly known as: PROSCAR Take 5 mg by mouth daily.   lisinopril 20 MG tablet Commonly known as: ZESTRIL Take 20 mg by mouth daily.   metFORMIN 500 MG tablet Commonly known as: GLUCOPHAGE Take 500 mg by mouth 2 (two) times daily.   metoprolol succinate 25 MG 24 hr tablet Commonly known as: Toprol XL Take 1 tablet (25 mg total) by mouth daily.   tamsulosin 0.4 MG Caps capsule Commonly known as: FLOMAX Take 1 capsule (0.4 mg total) by mouth daily.   VITAMIN D PO Take  1,000 Units by mouth daily.        Allergies:  Allergies  Allergen Reactions   Fentanyl Other (See Comments)    "Stopped breathing"    Family History: Family History  Adopted: Yes  Family history unknown: Yes    Social History:  reports that he has never smoked. He has never been exposed to tobacco smoke. He has never used smokeless tobacco. He reports current alcohol use. He reports that he does not use drugs.   Physical Exam: BP 128/80   Pulse 72   Ht '5\' 7"'$  (1.702 m)   Wt 198 lb (89.8 kg)   BMI 31.01 kg/m   Constitutional:  Well nourished. Alert and oriented, No acute distress. HEENT: [place] AT, moist mucus membranes.  Trachea midline Cardiovascular: No clubbing, cyanosis,  or edema. Respiratory: Normal respiratory effort, no increased work of breathing. Neurologic: Grossly intact, no focal deficits, moving all 4 extremities. Psychiatric: Normal mood and affect.   Laboratory Data: Urinalysis See Epic and HPI  I have reviewed the labs.   Pertinent Imaging: No recent imaging  Assessment & Plan:    1. Incomplete bladder emptying - managed with CIC x 2-3 - refills given through the [facility]  2. High risk hematuria -non-smoker -work up 2021 - enlarged prostate -no reports of gross heme -UA negative micro heme after UTI treatment   Return in about 1 year (around 07/18/2023) for UA, I PSS and PVR .  [facility] [address] [place], [place] [address] [phone]

## 2022-07-17 ENCOUNTER — Ambulatory Visit (INDEPENDENT_AMBULATORY_CARE_PROVIDER_SITE_OTHER): Payer: Medicare Other | Admitting: Urology

## 2022-07-17 ENCOUNTER — Encounter: Payer: Self-pay | Admitting: Urology

## 2022-07-17 VITALS — BP 128/80 | HR 72 | Ht 67.0 in | Wt 198.0 lb

## 2022-07-17 DIAGNOSIS — R319 Hematuria, unspecified: Secondary | ICD-10-CM

## 2022-07-17 DIAGNOSIS — R339 Retention of urine, unspecified: Secondary | ICD-10-CM | POA: Diagnosis not present

## 2022-07-17 LAB — URINALYSIS, COMPLETE
Bilirubin, UA: NEGATIVE
Ketones, UA: NEGATIVE
Leukocytes,UA: NEGATIVE
Nitrite, UA: NEGATIVE
Protein,UA: NEGATIVE
RBC, UA: NEGATIVE
Specific Gravity, UA: 1.025 (ref 1.005–1.030)
Urobilinogen, Ur: 0.2 mg/dL (ref 0.2–1.0)
pH, UA: 5 (ref 5.0–7.5)

## 2022-07-17 LAB — MICROSCOPIC EXAMINATION: Epithelial Cells (non renal): >10 /hpf — AB (ref 0–10)

## 2022-07-17 NOTE — Addendum Note (Signed)
Addended by: Evelina Bucy on: 07/17/2022 02:50 PM   Modules accepted: Orders

## 2022-07-31 ENCOUNTER — Ambulatory Visit: Payer: Medicare Other | Admitting: Nurse Practitioner

## 2022-08-03 DIAGNOSIS — D519 Vitamin B12 deficiency anemia, unspecified: Secondary | ICD-10-CM | POA: Insufficient documentation

## 2022-08-03 NOTE — Patient Instructions (Incomplete)
Vitamin B12 Deficiency Vitamin B12 deficiency occurs when the body does not have enough of this important vitamin. The body needs this vitamin: To make red blood cells. To make DNA. This is the genetic material inside cells. To help the nerves work properly so they can carry messages from the brain to the body. Vitamin B12 deficiency can cause health problems, such as not having enough red blood cells in the blood (anemia). This can lead to nerve damage if untreated. What are the causes? This condition may be caused by: Not eating enough foods that contain vitamin B12. Not having enough stomach acid and digestive fluids to properly absorb vitamin B12 from the food that you eat. Having certain diseases that make it hard to absorb vitamin B12. These diseases include Crohn's disease, chronic pancreatitis, and cystic fibrosis. An autoimmune disorder in which the body does not make enough of a protein (intrinsic factor) within the stomach, resulting in not enough absorption of vitamin B12. Having a surgery in which part of the stomach or small intestine is removed. Taking certain medicines that make it hard for the body to absorb vitamin B12. These include: Heartburn medicines, such as antacids and proton pump inhibitors. Some medicines that are used to treat diabetes. What increases the risk? The following factors may make you more likely to develop a vitamin B12 deficiency: Being an older adult. Eating a vegetarian or vegan diet that does not include any foods that come from animals. Eating a poor diet while you are pregnant. Taking certain medicines. Having alcoholism. What are the signs or symptoms? In some cases, there are no symptoms of this condition. If the condition leads to anemia or nerve damage, various symptoms may occur, such as: Weakness. Tiredness (fatigue). Loss of appetite. Numbness or tingling in your hands and feet. Redness and burning of the tongue. Depression,  confusion, or memory problems. Trouble walking. If anemia is severe, symptoms can include: Shortness of breath. Dizziness. Rapid heart rate. How is this diagnosed? This condition may be diagnosed with a blood test to measure the level of vitamin B12 in your blood. You may also have other tests, including: A group of tests that measure certain characteristics of blood cells (complete blood count, CBC). A blood test to measure intrinsic factor. A procedure where a thin tube with a camera on the end is used to look into your stomach or intestines (endoscopy). Other tests may be needed to discover the cause of the deficiency. How is this treated? Treatment for this condition depends on the cause. This condition may be treated by: Changing your eating and drinking habits, such as: Eating more foods that contain vitamin B12. Drinking less alcohol or no alcohol. Getting vitamin B12 injections. Taking vitamin B12 supplements by mouth (orally). Your health care provider will tell you which dose is best for you. Follow these instructions at home: Eating and drinking  Include foods in your diet that come from animals and contain a lot of vitamin B12. These include: Meats and poultry. This includes beef, pork, chicken, turkey, and organ meats, such as liver. Seafood. This includes clams, rainbow trout, salmon, tuna, and haddock. Eggs. Dairy foods such as milk, yogurt, and cheese. Eat foods that have vitamin B12 added to them (are fortified), such as ready-to-eat breakfast cereals. Check the label on the package to see if a food is fortified. The items listed above may not be a complete list of foods and beverages you can eat and drink. Contact a dietitian for   more information. Alcohol use Do not drink alcohol if: Your health care provider tells you not to drink. You are pregnant, may be pregnant, or are planning to become pregnant. If you drink alcohol: Limit how much you have to: 0-1 drink a  day for women. 0-2 drinks a day for men. Know how much alcohol is in your drink. In the [place] one drink equals one 12 oz bottle of beer (355 mL), one 5 oz glass of wine (148 mL), or one 1 oz glass of hard liquor (44 mL). General instructions Get vitamin B12 injections if told to by your health care provider. Take supplements only as told by your health care provider. Follow the directions carefully. Keep all follow-up visits. This is important. Contact a health care provider if: Your symptoms come back. Your symptoms get worse or do not improve with treatment. Get help right away: You develop shortness of breath. You have a rapid heart rate. You have chest pain. You become dizzy or you faint. These symptoms may be an emergency. Get help right away. Call 911. Do not wait to see if the symptoms will go away. Do not drive yourself to the hospital. Summary Vitamin B12 deficiency occurs when the body does not have enough of this important vitamin. Common causes include not eating enough foods that contain vitamin B12, not being able to absorb vitamin B12 from the food that you eat, having a surgery in which part of the stomach or small intestine is removed, or taking certain medicines. Eat foods that have vitamin B12 in them. Treatment may include making a change in the way you eat and drink, getting vitamin B12 injections, or taking vitamin B12 supplements. This information is not intended to replace advice given to you by your health care provider. Make sure you discuss any questions you have with your health care provider. Document Revised: 04/12/2021 Document Reviewed: 04/12/2021 Elsevier Patient Education  [place].

## 2022-08-04 ENCOUNTER — Ambulatory Visit (INDEPENDENT_AMBULATORY_CARE_PROVIDER_SITE_OTHER): Payer: Medicare Other | Admitting: Nurse Practitioner

## 2022-08-04 ENCOUNTER — Encounter: Payer: Self-pay | Admitting: Nurse Practitioner

## 2022-08-04 VITALS — BP 124/77 | HR 61 | Temp 97.5°F | Ht 67.01 in | Wt 197.2 lb

## 2022-08-04 DIAGNOSIS — N319 Neuromuscular dysfunction of bladder, unspecified: Secondary | ICD-10-CM

## 2022-08-04 DIAGNOSIS — E1169 Type 2 diabetes mellitus with other specified complication: Secondary | ICD-10-CM

## 2022-08-04 DIAGNOSIS — E785 Hyperlipidemia, unspecified: Secondary | ICD-10-CM

## 2022-08-04 DIAGNOSIS — E6609 Other obesity due to excess calories: Secondary | ICD-10-CM

## 2022-08-04 DIAGNOSIS — Z23 Encounter for immunization: Secondary | ICD-10-CM

## 2022-08-04 DIAGNOSIS — E119 Type 2 diabetes mellitus without complications: Secondary | ICD-10-CM

## 2022-08-04 DIAGNOSIS — D518 Other vitamin B12 deficiency anemias: Secondary | ICD-10-CM

## 2022-08-04 DIAGNOSIS — I340 Nonrheumatic mitral (valve) insufficiency: Secondary | ICD-10-CM

## 2022-08-04 DIAGNOSIS — I2510 Atherosclerotic heart disease of native coronary artery without angina pectoris: Secondary | ICD-10-CM

## 2022-08-04 DIAGNOSIS — E1129 Type 2 diabetes mellitus with other diabetic kidney complication: Secondary | ICD-10-CM | POA: Diagnosis not present

## 2022-08-04 DIAGNOSIS — R809 Proteinuria, unspecified: Secondary | ICD-10-CM

## 2022-08-04 DIAGNOSIS — N400 Enlarged prostate without lower urinary tract symptoms: Secondary | ICD-10-CM

## 2022-08-04 DIAGNOSIS — Z6830 Body mass index (BMI) 30.0-30.9, adult: Secondary | ICD-10-CM

## 2022-08-04 DIAGNOSIS — E1159 Type 2 diabetes mellitus with other circulatory complications: Secondary | ICD-10-CM | POA: Diagnosis not present

## 2022-08-04 DIAGNOSIS — I152 Hypertension secondary to endocrine disorders: Secondary | ICD-10-CM

## 2022-08-04 LAB — BAYER DCA HB A1C WAIVED: HB A1C (BAYER DCA - WAIVED): 7.4 % — ABNORMAL HIGH (ref 4.8–5.6)

## 2022-08-04 NOTE — Assessment & Plan Note (Signed)
Chronic, stable. Urine ALB 30 (August 2023) and A1c 7.4% today.  Recent increase at [facility] to Metformin 500 MG TID.  Recommend he trial 1000 MG BID + discuss GLP1 with them.  Continue Lisinopril for kidney protection.  Recommend he check BS 2-3 times daily and document for provider visits.  Focus on diet changes.  Return to office in 3 months.

## 2022-08-04 NOTE — Assessment & Plan Note (Signed)
Chronic, stable.  Self caths at home, monitor for infections.  Continue collaboration with urology and current medication regimen as prescribed by them.  Recent note reviewed.

## 2022-08-04 NOTE — Assessment & Plan Note (Signed)
Chronic, ongoing, followed by provider at [facility] in [place] for this.  Continue this collaboration.  A1c 7.4% today and recent increase at [facility] to Metformin 500 MG TID.  Recommend he trial 1000 MG BID + discuss GLP1 with them.

## 2022-08-04 NOTE — Assessment & Plan Note (Signed)
Noted on cardiology notes, continue collaboration with cardiology and current medication regimen ordered by them.

## 2022-08-04 NOTE — Assessment & Plan Note (Signed)
Noted on recent labs and started supplement, recheck level today and adjust supplement as needed.

## 2022-08-04 NOTE — Assessment & Plan Note (Signed)
Chronic, ongoing.  Continue statin therapy daily and adjust as needed.  Recheck lipid panel today.

## 2022-08-04 NOTE — Assessment & Plan Note (Signed)
Chronic, stable.  BP well at goal today in office. Recommend he monitor BP at least a few mornings a week at home and document.  DASH diet at home.  Continue current medication regimen and adjust as needed + collaboration with cardiology.  Labs today: BMP.  Return in 3 months.

## 2022-08-04 NOTE — Progress Notes (Signed)
BP 124/77   Pulse 61   Temp (!) 97.5 F (36.4 C) (Oral)   Ht 5' 7.01" (1.702 m)   Wt 197 lb 3.2 oz (89.4 kg)   SpO2 94%   BMI 30.88 kg/m    Subjective:    Patient ID: Kyle Richards, male    DOB: [birth year], 76 y.o.   MRN: [id number]  HPI: Kyle Richards is a 76 y.o. male  Chief Complaint  Patient presents with   Diabetes   Hypertension   Hyperlipidemia   Benign Prostatic Hypertrophy   DIABETES Last A1c was 7.2% in August.  Taking Metformin 500 MG BID -- reports the [place] has told him to increase to 500 MG TID, he has started this recently.  Taking B12 for low levels. Hypoglycemic episodes:no Polydipsia/polyuria: no Visual disturbance: no Chest pain: no Paresthesias: no Glucose Monitoring: no  Accucheck frequency: Not Checking  Fasting glucose:  Post prandial:  Evening:  Before meals: Taking Insulin?: no  Long acting insulin:  Short acting insulin: Blood Pressure Monitoring: not checking Retinal Examination: Up to Date Foot Exam: Up to Date Pneumovax: Up to Date Influenza: Up to Date Aspirin: yes   HYPERTENSION / HYPERLIPIDEMIA Continues on Atorvastatin, Lisinopril, and Metoprolol.  Followed by cardiology with last visit on 04/14/22.  Has underlying mitral regurgitation. Satisfied with current treatment? yes Duration of hypertension: chronic BP monitoring frequency: not checking BP range:  BP medication side effects: no Duration of hyperlipidemia: chronic Cholesterol medication side effects: no Cholesterol supplements: none Medication compliance: good compliance Aspirin: yes Recent stressors: no Recurrent headaches: no Visual changes: no Palpitations: no Dyspnea: no Chest pain: no Lower extremity edema: no Dizzy/lightheaded: no   BPH Followed by urology with last visit on 07/17/22 to return in one year. Continues on Flomax and Finasteride.  Has neurogenic bladder and self caths at home every 6 hours. BPH status: stable Satisfied with  current treatment?: yes Medication side effects: no Medication compliance: good compliance Duration: chronic Nocturia: no Urinary frequency:no Incomplete voiding: no Urgency: no Weak urinary stream: no Straining to start stream: no Dysuria: no Onset: gradual Severity: mild  Relevant past medical, surgical, family and social history reviewed and updated as indicated. Interim medical history since our last visit reviewed. Allergies and medications reviewed and updated.  Review of Systems  Constitutional:  Negative for activity change, diaphoresis, fatigue and fever.  Respiratory:  Negative for cough, chest tightness, shortness of breath and wheezing.   Cardiovascular:  Negative for chest pain, palpitations and leg swelling.  Gastrointestinal: Negative.   Endocrine: Negative for polydipsia, polyphagia and polyuria.  Neurological: Negative.   Psychiatric/Behavioral: Negative.      Per HPI unless specifically indicated above     Objective:    BP 124/77   Pulse 61   Temp (!) 97.5 F (36.4 C) (Oral)   Ht 5' 7.01" (1.702 m)   Wt 197 lb 3.2 oz (89.4 kg)   SpO2 94%   BMI 30.88 kg/m   Wt Readings from Last 3 Encounters:  08/04/22 197 lb 3.2 oz (89.4 kg)  07/17/22 198 lb (89.8 kg)  06/30/22 195 lb (88.5 kg)    Physical Exam Vitals and nursing note reviewed.  Constitutional:      General: He is awake. He is not in acute distress.    Appearance: He is well-developed and well-groomed. He is obese. He is not ill-appearing or toxic-appearing.  HENT:     Head: Normocephalic and atraumatic.     Right Ear:  Hearing and external ear normal. No drainage.     Left Ear: Hearing and external ear normal. No drainage.  Eyes:     General: Lids are normal.        Right eye: No discharge.        Left eye: No discharge.     Conjunctiva/sclera: Conjunctivae normal.     Pupils: Pupils are equal, round, and reactive to light.  Neck:     Thyroid: No thyromegaly.     Vascular: No carotid  bruit.     Trachea: Trachea normal.  Cardiovascular:     Rate and Rhythm: Normal rate and regular rhythm.     Heart sounds: S1 normal and S2 normal. Murmur heard.     Systolic murmur is present with a grade of 2/6.     No gallop.  Pulmonary:     Effort: Pulmonary effort is normal. No accessory muscle usage or respiratory distress.     Breath sounds: Normal breath sounds.  Abdominal:     General: Bowel sounds are normal.     Palpations: Abdomen is soft.  Musculoskeletal:        General: Normal range of motion.     Cervical back: Normal range of motion and neck supple.     Right lower leg: No edema.     Left lower leg: No edema.  Lymphadenopathy:     Cervical: No cervical adenopathy.  Skin:    General: Skin is warm and dry.     Capillary Refill: Capillary refill takes less than 2 seconds.  Neurological:     Mental Status: He is alert and oriented to person, place, and time.     Deep Tendon Reflexes: Reflexes are normal and symmetric.     Reflex Scores:      Brachioradialis reflexes are 2+ on the right side and 2+ on the left side.      Patellar reflexes are 2+ on the right side and 2+ on the left side. Psychiatric:        Attention and Perception: Attention normal.        Mood and Affect: Mood normal.        Speech: Speech normal.        Behavior: Behavior normal. Behavior is cooperative.        Thought Content: Thought content normal.    Results for orders placed or performed in visit on 07/17/22  Microscopic Examination   Urine  Result Value Ref Range   WBC, UA 0-5 0 - 5 /hpf   RBC, Urine 0-2 0 - 2 /hpf   Epithelial Cells (non renal) >10 (A) 0 - 10 /hpf   Bacteria, UA Moderate (A) None seen/Few  Urinalysis, Complete  Result Value Ref Range   Specific Gravity, UA 1.025 1.005 - 1.030   pH, UA 5.0 5.0 - 7.5   Color, UA Yellow Yellow   Appearance Ur Clear Clear   Leukocytes,UA Negative Negative   Protein,UA Negative Negative/Trace   Glucose, UA Trace (A) Negative    Ketones, UA Negative Negative   RBC, UA Negative Negative   Bilirubin, UA Negative Negative   Urobilinogen, Ur 0.2 0.2 - 1.0 mg/dL   Nitrite, UA Negative Negative   Microscopic Examination See below:       Assessment & Plan:   Problem List Items Addressed This Visit       Cardiovascular and Mediastinum   Coronary artery disease involving native coronary artery of native heart without angina pectoris  Chronic, stable with no recent chest pain.  Continue collaboration with cardiology and current medication regimen as ordered by them.        Hypertension associated with diabetes (HCC)    Chronic, stable.  BP well at goal today in office. Recommend he monitor BP at least a few mornings a week at home and document.  DASH diet at home.  Continue current medication regimen and adjust as needed + collaboration with cardiology.  Labs today: BMP.  Return in 3 months.      Relevant Orders   Bayer DCA Hb A1c Waived   Basic metabolic panel   Nonrheumatic mitral valve regurgitation    Noted on cardiology notes, continue collaboration with cardiology and current medication regimen ordered by them.        Endocrine   Diabetes mellitus type 2 without retinopathy ([place])    Chronic, ongoing, followed by provider at [facility] in [place] for this.  Continue this collaboration.  A1c 7.4% today and recent increase at [facility] to Metformin 500 MG TID.  Recommend he trial 1000 MG BID + discuss GLP1 with them.        Relevant Orders   Bayer DCA Hb A1c Waived   Basic metabolic panel   Hyperlipidemia associated with type 2 diabetes mellitus (HCC)    Chronic, ongoing.  Continue statin therapy daily and adjust as needed.  Recheck lipid panel today.      Relevant Orders   Bayer DCA Hb A1c Waived   Lipid Panel w/o Chol/HDL Ratio   Type 2 diabetes mellitus with proteinuria (HCC) - Primary    Chronic, stable. Urine ALB 30 (August 2023) and A1c 7.4% today.  Recent increase at [facility] to Metformin 500 MG TID.   Recommend he trial 1000 MG BID + discuss GLP1 with them.  Continue Lisinopril for kidney protection.  Recommend he check BS 2-3 times daily and document for provider visits.  Focus on diet changes.  Return to office in 3 months.      Relevant Orders   Bayer DCA Hb A1c Waived   Basic metabolic panel     Genitourinary   BPH without urinary obstruction    Chronic, ongoing.  Continue collaboration with urology and current medication regimen as prescribed by them.  Recent note reviewed.        Other   B12 deficiency anemia    Noted on recent labs and started supplement, recheck level today and adjust supplement as needed.      Relevant Medications   cyanocobalamin (VITAMIN B12) 500 MCG tablet   Other Relevant Orders   Vitamin B12   Neurogenic bladder    Chronic, stable.  Self caths at home, monitor for infections.  Continue collaboration with urology and current medication regimen as prescribed by them.  Recent note reviewed.      Obesity    BMI 30.88.  Recommended eating smaller high protein, low fat meals more frequently and exercising 30 mins a day 5 times a week with a goal of 10-15lb weight loss in the next 3 months. Patient voiced their understanding and motivation to adhere to these recommendations.         Follow up plan: Return in about 3 months (around 11/03/2022) for T2DM, HTN/HLD, NEUROGENIC BLADDER, BPH.

## 2022-08-04 NOTE — Assessment & Plan Note (Signed)
Chronic, stable with no recent chest pain.  Continue collaboration with cardiology and current medication regimen as ordered by them.

## 2022-08-04 NOTE — Assessment & Plan Note (Signed)
BMI 30.88.  Recommended eating smaller high protein, low fat meals more frequently and exercising 30 mins a day 5 times a week with a goal of 10-15lb weight loss in the next 3 months. Patient voiced their understanding and motivation to adhere to these recommendations.

## 2022-08-04 NOTE — Assessment & Plan Note (Signed)
Chronic, ongoing.  Continue collaboration with urology and current medication regimen as prescribed by them.  Recent note reviewed.

## 2022-08-05 LAB — BASIC METABOLIC PANEL
BUN/Creatinine Ratio: 15 (ref 10–24)
BUN: 18 mg/dL (ref 8–27)
CO2: 19 mmol/L — ABNORMAL LOW (ref 20–29)
Calcium: 9.6 mg/dL (ref 8.6–10.2)
Chloride: 105 mmol/L (ref 96–106)
Creatinine, Ser: 1.21 mg/dL (ref 0.76–1.27)
Glucose: 191 mg/dL — ABNORMAL HIGH (ref 70–99)
Potassium: 4.9 mmol/L (ref 3.5–5.2)
Sodium: 139 mmol/L (ref 134–144)
eGFR: 62 mL/min/{1.73_m2} (ref >59)

## 2022-08-05 LAB — LIPID PANEL W/O CHOL/HDL RATIO
Cholesterol, Total: 144 mg/dL (ref 100–199)
HDL: 47 mg/dL (ref >39)
LDL Chol Calc (NIH): 82 mg/dL (ref 0–99)
Triglycerides: 77 mg/dL (ref 0–149)
VLDL Cholesterol Cal: 15 mg/dL (ref 5–40)

## 2022-08-05 LAB — VITAMIN B12: Vitamin B-12: 732 pg/mL (ref 232–1245)

## 2022-08-05 NOTE — Progress Notes (Signed)
Contacted via [place] afternoon Song, your labs have returned and overall are stable.  Kidney function, creatinine and eGFR, remains normal.  B12 level is improving, continue supplement.  Any questions? Keep being awesome!!  Thank you for allowing me to participate in your care.  I appreciate you. Kindest regards, Tracy Gerken

## 2022-09-08 ENCOUNTER — Ambulatory Visit: Payer: No Typology Code available for payment source | Admitting: Dermatology

## 2022-10-27 NOTE — Progress Notes (Signed)
11/01/22 6:18 PM   Kyle Richards [birth year] [id number]  Referring provider:  Venita Lick, NP [address] [place],  [place] [address]  Urological history  1. High risk hematuria -non-smoker -RUS 06/2020 Normal ultrasound appearance of the kidneys-Enlarged prostate -cysto 06/2020 Enlarged prostate with significant bilobar coaptation - Mild bladder neck -no reports of gross heme -UA negative for micro heme  2. Incomplete emptying -managed with CIC x 2-3   3. Bilateral hydroceles -scrotal ultrasound 03/2021 - small bilateral hydroceles   4. Epididymitis -scrotal ultrasound 03/2021 - right sided epididymitis   5. BPH with incomplete bladder emptying -PSA (10/2020) 2.470 -prostate volume 45 cc  Chief Complaint  Patient presents with   Hematuria    HPI: Kyle Richards is a 77 y.o.male who presents today for urinary frequency (patient does self cath).  Last week, he had an episode of nocturia x 4-5 that lasted only one night.  It was associated with right-sided flank pain and he felt his urine was darker than normal.  He did increase his fluid a few days and now his symptoms have abated.  He did have 1 incident when he was self cathing and some blood came out on catheter tip.  [name]  Patient denies any gross hematuria, dysuria or suprapubic/flank pain.  Patient denies any fevers, chills, nausea or vomiting.    CATH UA yellow clear, trace glucose, specific gravity 1.025, pH 6.0, 0-5 WBCs, 0-2 RBCs, greater than 10 epithelial cells and moderate bacteria.  PVR 0 mL    PMH: Past Medical History:  Diagnosis Date   Diabetes ([place])    Heart attack ([place]) 2012   High cholesterol    Hypertension    Neurogenic bladder disorder    Neuropathy     Surgical History: Past Surgical History:  Procedure Laterality Date   FINGER FRACTURE SURGERY Left    middle finger left hand   OTHER SURGICAL HISTORY     Heart Bypass    TONSILLECTOMY AND ADENOIDECTOMY  1953    Home Medications:  Allergies as of 10/28/2022       Reactions   Fentanyl Other (See Comments)   "Stopped breathing"        Medication List        Accurate as of October 28, 2022 11:59 PM. If you have any questions, ask your nurse or doctor.          aspirin 81 MG chewable tablet Chew 81 mg by mouth daily.   atorvastatin 40 MG tablet Commonly known as: LIPITOR Take 40 mg by mouth daily.   cyanocobalamin 500 MCG tablet Commonly known as: VITAMIN B12 Take 2 tablets by mouth daily.   finasteride 5 MG tablet Commonly known as: PROSCAR Take 5 mg by mouth daily.   lisinopril 20 MG tablet Commonly known as: ZESTRIL Take 20 mg by mouth daily.   metFORMIN 500 MG tablet Commonly known as: GLUCOPHAGE Take 500 mg by mouth 3 (three) times daily.   metoprolol succinate 25 MG 24 hr tablet Commonly known as: Toprol XL Take 1 tablet (25 mg total) by mouth daily.   tamsulosin 0.4 MG Caps capsule Commonly known as: FLOMAX Take 1 capsule (0.4 mg total) by mouth daily.   VITAMIN D PO Take 1,000 Units by mouth daily.        Allergies:  Allergies  Allergen Reactions   Fentanyl Other (See Comments)    "Stopped breathing"    Family History:  Family History  Adopted: Yes  Family history unknown: Yes    Social History:  reports that he has never smoked. He has never been exposed to tobacco smoke. He has never used smokeless tobacco. He reports current alcohol use. He reports that he does not use drugs.   Physical Exam: BP (!) 148/83   Pulse 70   Ht '5\' 7"'$  (1.702 m)   Wt 197 lb (89.4 kg)   SpO2 97%   BMI 30.85 kg/m   Constitutional:  Well nourished. Alert and oriented, No acute distress. HEENT: [place] AT, moist mucus membranes.  Trachea midline Cardiovascular: No clubbing, cyanosis, or edema. Respiratory: Normal respiratory effort, no increased work of breathing. Neurologic: Grossly intact, no focal deficits, moving all 4  extremities. Psychiatric: Normal mood and affect.   Laboratory Data: Urinalysis See Epic and HPI  I have reviewed the labs.   Pertinent Imaging:  10/28/22 14:36  Scan Result 0    Assessment & Plan:    1. Incomplete bladder emptying - managed with CIC x 2-3 - refills given through the [facility]  2. High risk hematuria -non-smoker -work up 2021 - enlarged prostate -reports of gross heme after one self cath -UA negative micro heme   3. LU TS -one day of self-limiting symptoms -UA unremarkable -urine sent for culture in case he develops symptoms -He will contact us if the symptoms return  Return for Keep follow up in November.  Kyle Richards, [place] [address] [place], [place] [address] [phone]

## 2022-10-28 ENCOUNTER — Ambulatory Visit (INDEPENDENT_AMBULATORY_CARE_PROVIDER_SITE_OTHER): Payer: Medicare Other | Admitting: Urology

## 2022-10-28 ENCOUNTER — Encounter: Payer: Self-pay | Admitting: Urology

## 2022-10-28 VITALS — BP 148/83 | HR 70 | Ht 67.0 in | Wt 197.0 lb

## 2022-10-28 DIAGNOSIS — R319 Hematuria, unspecified: Secondary | ICD-10-CM | POA: Diagnosis not present

## 2022-10-28 DIAGNOSIS — R339 Retention of urine, unspecified: Secondary | ICD-10-CM | POA: Diagnosis not present

## 2022-10-28 LAB — URINALYSIS, COMPLETE
Bilirubin, UA: NEGATIVE
Ketones, UA: NEGATIVE
Leukocytes,UA: NEGATIVE
Nitrite, UA: NEGATIVE
Protein,UA: NEGATIVE
RBC, UA: NEGATIVE
Specific Gravity, UA: 1.025 (ref 1.005–1.030)
Urobilinogen, Ur: 0.2 mg/dL (ref 0.2–1.0)
pH, UA: 6 (ref 5.0–7.5)

## 2022-10-28 LAB — MICROSCOPIC EXAMINATION: Epithelial Cells (non renal): >10 /hpf — AB (ref 0–10)

## 2022-10-28 LAB — BLADDER SCAN AMB NON-IMAGING: Scan Result: 0

## 2022-10-28 NOTE — Patient Instructions (Signed)
We will call you with your results.    Follow-up with our office as needed.  Please call and ask to speak with a nurse if you develop questions or concerns.

## 2022-10-31 ENCOUNTER — Encounter: Payer: Self-pay | Admitting: Family Medicine

## 2022-10-31 LAB — CULTURE, URINE COMPREHENSIVE

## 2022-11-03 ENCOUNTER — Ambulatory Visit: Payer: Medicare Other | Admitting: Nurse Practitioner

## 2022-11-10 ENCOUNTER — Encounter: Payer: Self-pay | Admitting: Dermatology

## 2022-11-10 ENCOUNTER — Ambulatory Visit (INDEPENDENT_AMBULATORY_CARE_PROVIDER_SITE_OTHER): Payer: Medicare Other | Admitting: Dermatology

## 2022-11-10 VITALS — BP 137/81

## 2022-11-10 DIAGNOSIS — Z1283 Encounter for screening for malignant neoplasm of skin: Secondary | ICD-10-CM | POA: Diagnosis not present

## 2022-11-10 DIAGNOSIS — L570 Actinic keratosis: Secondary | ICD-10-CM

## 2022-11-10 DIAGNOSIS — D1801 Hemangioma of skin and subcutaneous tissue: Secondary | ICD-10-CM

## 2022-11-10 DIAGNOSIS — Z79899 Other long term (current) drug therapy: Secondary | ICD-10-CM

## 2022-11-10 DIAGNOSIS — L578 Other skin changes due to chronic exposure to nonionizing radiation: Secondary | ICD-10-CM | POA: Diagnosis not present

## 2022-11-10 DIAGNOSIS — D229 Melanocytic nevi, unspecified: Secondary | ICD-10-CM

## 2022-11-10 DIAGNOSIS — M2041 Other hammer toe(s) (acquired), right foot: Secondary | ICD-10-CM

## 2022-11-10 DIAGNOSIS — L814 Other melanin hyperpigmentation: Secondary | ICD-10-CM

## 2022-11-10 DIAGNOSIS — L820 Inflamed seborrheic keratosis: Secondary | ICD-10-CM | POA: Diagnosis not present

## 2022-11-10 DIAGNOSIS — Z7189 Other specified counseling: Secondary | ICD-10-CM

## 2022-11-10 DIAGNOSIS — Z5111 Encounter for antineoplastic chemotherapy: Secondary | ICD-10-CM

## 2022-11-10 DIAGNOSIS — L821 Other seborrheic keratosis: Secondary | ICD-10-CM

## 2022-11-10 NOTE — Patient Instructions (Addendum)
Instructions for [facility] Medications  One or more of your medications was sent to the [facility] mail order compounding pharmacy. You will receive an email from them and can purchase the medicine through that link. It will then be mailed to your home at the address you confirmed. If for any reason you do not receive an email from them, please check your spam folder. If you still do not find the email, please let us know. [facility] phone number is [phone].    Cryotherapy Aftercare  Wash gently with soap and water everyday.   Apply Vaseline and Band-Aid daily until healed.    Due to recent changes in healthcare laws, you may see results of your pathology and/or laboratory studies on MyChart before the doctors have had a chance to review them. We understand that in some cases there may be results that are confusing or concerning to you. Please understand that not all results are received at the same time and often the doctors may need to interpret multiple results in order to provide you with the best plan of care or course of treatment. Therefore, we ask that you please give us 2 business days to thoroughly review all your results before contacting the office for clarification. Should we see a critical lab result, you will be contacted sooner.   If You Need Anything After Your Visit  If you have any questions or concerns for your doctor, please call our main line at [phone] and press option 4 to reach your doctor's medical assistant. If no one answers, please leave a voicemail as directed and we will return your call as soon as possible. Messages left after 4 pm will be answered the following business day.   You may also send us a message via MyChart. We typically respond to MyChart messages within 1-2 business days.  For prescription refills, please ask your pharmacy to contact our office. Our fax number is [phone].  If you have an urgent issue when the clinic  is closed that cannot wait until the next business day, you can page your doctor at the number below.    Please note that while we do our best to be available for urgent issues outside of office hours, we are not available 24/7.   If you have an urgent issue and are unable to reach us, you may choose to seek medical care at your doctor's office, retail clinic, urgent care center, or emergency room.  If you have a medical emergency, please immediately call 911 or go to the emergency department.  Pager Numbers  - Dr. Kowalski: [phone]  - Dr. Moye: [phone]  - Dr. Stewart: [phone]  In the event of inclement weather, please call our main line at [phone] for an update on the status of any delays or closures.  Dermatology Medication Tips: Please keep the boxes that topical medications come in in order to help keep track of the instructions about where and how to use these. Pharmacies typically print the medication instructions only on the boxes and not directly on the medication tubes.   If your medication is too expensive, please contact our office at [phone] option 4 or send us a message through MyChart.   We are unable to tell what your co-pay for medications will be in advance as this is different depending on your insurance coverage. However, we may be able to find a substitute medication at lower cost or fill out paperwork to get insurance to cover   a needed medication.   If a prior authorization is required to get your medication covered by your insurance company, please allow us 1-2 business days to complete this process.  Drug prices often vary depending on where the prescription is filled and some pharmacies may offer cheaper prices.  The website www.goodrx.com contains coupons for medications through different pharmacies. The prices here do not account for what the cost may be with help from insurance (it may be cheaper with your insurance), but the website  can give you the price if you did not use any insurance.  - You can print the associated coupon and take it with your prescription to the pharmacy.  - You may also stop by our office during regular business hours and pick up a GoodRx coupon card.  - If you need your prescription sent electronically to a different pharmacy, notify our office through [facility] MyChart or by phone at [phone] option 4.     Si Usted Necesita Algo Despus de Su Visita  Tambin puede enviarnos un mensaje a travs de MyChart. Por lo general respondemos a los mensajes de MyChart en el transcurso de 1 a 2 das hbiles.  Para renovar recetas, por favor pida a su farmacia que se ponga en contacto con nuestra oficina. Nuestro nmero de fax es el [phone].  Si tiene un asunto urgente cuando la clnica est cerrada y que no puede esperar hasta el siguiente da hbil, puede llamar/localizar a su doctor(a) al nmero que aparece a continuacin.   Por favor, tenga en cuenta que aunque hacemos todo lo posible para estar disponibles para asuntos urgentes fuera del horario de oficina, no estamos disponibles las 24 horas del da, los 7 das de la semana.   Si tiene un problema urgente y no puede comunicarse con nosotros, puede optar por buscar atencin mdica  en el consultorio de su doctor(a), en una clnica privada, en un centro de atencin urgente o en una sala de emergencias.  Si tiene una emergencia mdica, por favor llame inmediatamente al 911 o vaya a la sala de emergencias.  Nmeros de bper  - Dr. Kowalski: [phone]  - Dra. Moye: [phone]  - Dra. Stewart: [phone]  En caso de inclemencias del tiempo, por favor llame a nuestra lnea principal al [phone] para una actualizacin sobre el estado de cualquier retraso o cierre.  Consejos para la medicacin en dermatologa: Por favor, guarde las cajas en las que vienen los medicamentos de uso tpico para ayudarle a seguir las instrucciones  sobre dnde y cmo usarlos. Las farmacias generalmente imprimen las instrucciones del medicamento slo en las cajas y no directamente en los tubos del medicamento.   Si su medicamento es muy caro, por favor, pngase en contacto con nuestra oficina llamando al [phone] y presione la opcin 4 o envenos un mensaje a travs de MyChart.   No podemos decirle cul ser su copago por los medicamentos por adelantado ya que esto es diferente dependiendo de la cobertura de su seguro. Sin embargo, es posible que podamos encontrar un medicamento sustituto a menor costo o llenar un formulario para que el seguro cubra el medicamento que se considera necesario.   Si se requiere una autorizacin previa para que su compaa de seguros cubra su medicamento, por favor permtanos de 1 a 2 das hbiles para completar este proceso.  Los precios de los medicamentos varan con frecuencia dependiendo del lugar de dnde se surte la receta y alguna farmacias pueden ofrecer precios ms baratos.    El sitio web www.goodrx.com tiene cupones para medicamentos de diferentes farmacias. Los precios aqu no tienen en cuenta lo que podra costar con la ayuda del seguro (puede ser ms barato con su seguro), pero el sitio web puede darle el precio si no utiliz ningn seguro.  - Puede imprimir el cupn correspondiente y llevarlo con su receta a la farmacia.  - Tambin puede pasar por nuestra oficina durante el horario de atencin regular y recoger una tarjeta de cupones de GoodRx.  - Si necesita que su receta se enve electrnicamente a una farmacia diferente, informe a nuestra oficina a travs de MyChart de [facility] o por telfono llamando al [phone] y presione la opcin 4.  

## 2022-11-10 NOTE — Progress Notes (Signed)
Follow-Up Visit   Subjective  Kyle Richards is a 77 y.o. male who presents for the following: Follow-up (ISK follow up of trunk treated with LN2 x 20) and Actinic Keratosis (Scalp and face treated with LN2 x 12). The patient presents for Total-Body Skin Exam (TBSE) for skin cancer screening and mole check.  The patient has spots, moles and lesions to be evaluated, some may be new or changing and the patient has concerns that these could be cancer.  The following portions of the chart were reviewed this encounter and updated as appropriate:   Tobacco  Allergies  Meds  Problems  Med Hx  Surg Hx  Fam Hx     Review of Systems:  No other skin or systemic complaints except as noted in HPI or Assessment and Plan.  Objective  Well appearing patient in no apparent distress; mood and affect are within normal limits.  A full examination was performed including scalp, head, eyes, ears, nose, lips, neck, chest, axillae, abdomen, back, buttocks, bilateral upper extremities, bilateral lower extremities, hands, feet, fingers, toes, fingernails, and toenails. All findings within normal limits unless otherwise noted below.  Scalp, forehead, nose (17) Erythematous thin papules/macules with gritty scale.    Assessment & Plan   Lentigines - Scattered tan macules - Due to sun exposure - Benign-appearing, observe - Recommend daily broad spectrum sunscreen SPF 30+ to sun-exposed areas, reapply every 2 hours as needed. - Call for any changes  Seborrheic Keratoses - Stuck-on, waxy, tan-brown papules and/or plaques  - Benign-appearing - Discussed benign etiology and prognosis. - Observe - Call for any changes  Melanocytic Nevi - Tan-brown and/or pink-flesh-colored symmetric macules and papules - Benign appearing on exam today - Observation - Call clinic for new or changing moles - Recommend daily use of broad spectrum spf 30+ sunscreen to sun-exposed areas.   Hemangiomas - Red  papules - Discussed benign nature - Observe - Call for any changes  Skin cancer screening performed today.  AK (actinic keratosis) (17) Scalp, forehead, nose  Actinic Damage with PreCancerous Actinic Keratoses Counseling for Topical Chemotherapy Management: Patient exhibits: - Severe, confluent actinic changes with pre-cancerous actinic keratoses that is secondary to cumulative UV radiation exposure over time - Condition that is severe; chronic, not at goal. - diffuse scaly erythematous macules and papules with underlying dyspigmentation - Discussed Prescription "Field Treatment" topical Chemotherapy for Severe, Chronic Confluent Actinic Changes with Pre-Cancerous Actinic Keratoses Field treatment involves treatment of an entire area of skin that has confluent Actinic Changes (Sun/ Ultraviolet light damage) and PreCancerous Actinic Keratoses by method of PhotoDynamic Therapy (PDT) and/or prescription Topical Chemotherapy agents such as 5-fluorouracil, 5-fluorouracil/calcipotriene, and/or imiquimod.  The purpose is to decrease the number of clinically evident and subclinical PreCancerous lesions to prevent progression to development of skin cancer by chemically destroying early precancer changes that may or may not be visible.  It has been shown to reduce the risk of developing skin cancer in the treated area. As a result of treatment, redness, scaling, crusting, and open sores may occur during treatment course. One or more than one of these methods may be used and may have to be used several times to control, suppress and eliminate the PreCancerous changes. Discussed treatment course, expected reaction, and possible side effects. - Recommend daily broad spectrum sunscreen SPF 30+ to sun-exposed areas, reapply every 2 hours as needed.  - Staying in the shade or wearing long sleeves, sun glasses (UVA+UVB protection) and wide brim hats (4-inch  brim around the entire circumference of the hat) are also  recommended. - Call for new or changing lesions.  - Start 5-fluorouracil/calcipotriene cream twice a day for 7 days to affected areas including scalp, forehead. Prescription sent to [facility]. Patient advised they will receive an email to purchase the medication online and have it sent to their home. Patient provided with handout reviewing treatment course and side effects and advised to call or message [place] on MyChart with any concerns. Advised patient to start in one month.  Destruction of lesion - Scalp, forehead, nose Complexity: simple   Destruction method: cryotherapy   Informed consent: discussed and consent obtained   Timeout:  patient name, date of birth, surgical site, and procedure verified Lesion destroyed using liquid nitrogen: Yes   Region frozen until ice ball extended beyond lesion: Yes   Outcome: patient tolerated procedure well with no complications   Post-procedure details: wound care instructions given    Inflamed seborrheic keratosis Right tricep x 1 Destruction of lesion - Right tricep x 1 Complexity: simple   Destruction method: cryotherapy   Informed consent: discussed and consent obtained   Timeout:  patient name, date of birth, surgical site, and procedure verified Lesion destroyed using liquid nitrogen: Yes   Region frozen until ice ball extended beyond lesion: Yes   Outcome: patient tolerated procedure well with no complications   Post-procedure details: wound care instructions given    [place]amm[place]oe of right foot Right Foot - Anterior Recommend evaluation by podiatrist. Will refer patient to [facility] referral to Podiatry - Right Foot - Anterior  Return in about 6 months (around 05/13/2023) for AK follow up.  I, Ashok Cordia, CMA, am acting as scribe for Sarina Ser, MD . Documentation: I have reviewed the above documentation for accuracy and completeness, and I agree with the above.  Sarina Ser, MD

## 2022-11-22 DIAGNOSIS — H353130 Nonexudative age-related macular degeneration, bilateral, stage unspecified: Secondary | ICD-10-CM | POA: Insufficient documentation

## 2022-11-22 NOTE — Patient Instructions (Signed)
Diabetes Mellitus Basics  Diabetes mellitus, or diabetes, is a long-term (chronic) disease. It occurs when the body does not properly use sugar (glucose) that is released from food after you eat. Diabetes mellitus may be caused by one or both of these problems: Your pancreas does not make enough of a hormone called insulin. Your body does not react in a normal way to the insulin that it makes. Insulin lets glucose enter cells in your body. This gives you energy. If you have diabetes, glucose cannot get into cells. This causes high blood glucose (hyperglycemia). How to treat and manage diabetes You may need to take insulin or other diabetes medicines daily to keep your glucose in balance. If you are prescribed insulin, you will learn how to give yourself insulin by injection. You may need to adjust the amount of insulin you take based on the foods that you eat. You will need to check your blood glucose levels using a glucose monitor as told by your health care provider. The readings can help determine if you have low or high blood glucose. Generally, you should have these blood glucose levels: Before meals (preprandial): 80-130 mg/dL (4.4-7.2 mmol/L). After meals (postprandial): below 180 mg/dL (10 mmol/L). Hemoglobin A1c (HbA1c) level: less than 7%. Your health care provider will set treatment goals for you. Keep all follow-up visits. This is important. Follow these instructions at home: Diabetes medicines Take your diabetes medicines every day as told by your health care provider. List your diabetes medicines here: Name of medicine: ______________________________ Amount (dose): _______________ Time (a.m./p.m.): _______________ Notes: ___________________________________ Name of medicine: ______________________________ Amount (dose): _______________ Time (a.m./p.m.): _______________ Notes: ___________________________________ Name of medicine: ______________________________ Amount (dose):  _______________ Time (a.m./p.m.): _______________ Notes: ___________________________________ Insulin If you use insulin, list the types of insulin you use here: Insulin type: ______________________________ Amount (dose): _______________ Time (a.m./p.m.): _______________Notes: ___________________________________ Insulin type: ______________________________ Amount (dose): _______________ Time (a.m./p.m.): _______________ Notes: ___________________________________ Insulin type: ______________________________ Amount (dose): _______________ Time (a.m./p.m.): _______________ Notes: ___________________________________ Insulin type: ______________________________ Amount (dose): _______________ Time (a.m./p.m.): _______________ Notes: ___________________________________ Insulin type: ______________________________ Amount (dose): _______________ Time (a.m./p.m.): _______________ Notes: ___________________________________ Managing blood glucose  Check your blood glucose levels using a glucose monitor as told by your health care provider. Write down the times that you check your glucose levels here: Time: _______________ Notes: ___________________________________ Time: _______________ Notes: ___________________________________ Time: _______________ Notes: ___________________________________ Time: _______________ Notes: ___________________________________ Time: _______________ Notes: ___________________________________ Time: _______________ Notes: ___________________________________  Low blood glucose Low blood glucose (hypoglycemia) is when glucose is at or below 70 mg/dL (3.9 mmol/L). Symptoms may include: Feeling: Hungry. Sweaty and clammy. Irritable or easily upset. Dizzy. Sleepy. Having: A fast heartbeat. A headache. A change in your vision. Numbness around the mouth, lips, or tongue. Having trouble with: Moving (coordination). Sleeping. Treating low blood glucose To treat low blood  glucose, eat or drink something containing sugar right away. If you can think clearly and swallow safely, follow the 15:15 rule: Take 15 grams of a fast-acting carb (carbohydrate), as told by your health care provider. Some fast-acting carbs are: Glucose tablets: take 3-4 tablets. Hard candy: eat 3-5 pieces. Fruit juice: drink 4 oz (120 mL). Regular (not diet) soda: drink 4-6 oz (120-180 mL). Honey or sugar: eat 1 Tbsp (15 mL). Check your blood glucose levels 15 minutes after you take the carb. If your glucose is still at or below 70 mg/dL (3.9 mmol/L), take 15 grams of a carb again. If your glucose does not go above 70 mg/dL (3.9 mmol/L) after   3 tries, get help right away. After your glucose goes back to normal, eat a meal or a snack within 1 hour. Treating very low blood glucose If your glucose is at or below 54 mg/dL (3 mmol/L), you have very low blood glucose (severe hypoglycemia). This is an emergency. Do not wait to see if the symptoms will go away. Get medical help right away. Call your local emergency services (911 in the [place] Do not drive yourself to the hospital. Questions to ask your health care provider Should I talk with a diabetes educator? What equipment will I need to care for myself at home? What diabetes medicines do I need? When should I take them? How often do I need to check my blood glucose levels? What number can I call if I have questions? When is my follow-up visit? Where can I find a support group for people with diabetes? Where to find more information American Diabetes Association: www.diabetes.org Association of Diabetes Care and Education Specialists: www.diabeteseducator.org Contact a health care provider if: Your blood glucose is at or above 240 mg/dL (13.3 mmol/L) for 2 days in a row. You have been sick or have had a fever for 2 days or more, and you are not getting better. You have any of these problems for more than 6 hours: You cannot eat or  drink. You feel nauseous. You vomit. You have diarrhea. Get help right away if: Your blood glucose is lower than 54 mg/dL (3 mmol/L). You get confused. You have trouble thinking clearly. You have trouble breathing. These symptoms may represent a serious problem that is an emergency. Do not wait to see if the symptoms will go away. Get medical help right away. Call your local emergency services (911 in the [place] Do not drive yourself to the hospital. Summary Diabetes mellitus is a chronic disease that occurs when the body does not properly use sugar (glucose) that is released from food after you eat. Take insulin and diabetes medicines as told. Check your blood glucose every day, as often as told. Keep all follow-up visits. This is important. This information is not intended to replace advice given to you by your health care provider. Make sure you discuss any questions you have with your health care provider. Document Revised: 12/20/2019 Document Reviewed: 12/20/2019 Elsevier Patient Education  2023 [employer]  

## 2022-11-28 ENCOUNTER — Ambulatory Visit: Payer: Medicare Other | Admitting: Nurse Practitioner

## 2022-11-28 ENCOUNTER — Encounter: Payer: Self-pay | Admitting: Nurse Practitioner

## 2022-11-28 VITALS — BP 128/68 | HR 73 | Temp 97.6°F | Ht 67.01 in | Wt 193.0 lb

## 2022-11-28 DIAGNOSIS — Z0000 Encounter for general adult medical examination without abnormal findings: Secondary | ICD-10-CM | POA: Diagnosis not present

## 2022-11-28 DIAGNOSIS — E1129 Type 2 diabetes mellitus with other diabetic kidney complication: Secondary | ICD-10-CM | POA: Diagnosis not present

## 2022-11-28 DIAGNOSIS — E1159 Type 2 diabetes mellitus with other circulatory complications: Secondary | ICD-10-CM

## 2022-11-28 DIAGNOSIS — E119 Type 2 diabetes mellitus without complications: Secondary | ICD-10-CM | POA: Diagnosis not present

## 2022-11-28 DIAGNOSIS — I152 Hypertension secondary to endocrine disorders: Secondary | ICD-10-CM

## 2022-11-28 DIAGNOSIS — E1169 Type 2 diabetes mellitus with other specified complication: Secondary | ICD-10-CM

## 2022-11-28 DIAGNOSIS — R809 Proteinuria, unspecified: Secondary | ICD-10-CM

## 2022-11-28 DIAGNOSIS — D518 Other vitamin B12 deficiency anemias: Secondary | ICD-10-CM

## 2022-11-28 DIAGNOSIS — E6609 Other obesity due to excess calories: Secondary | ICD-10-CM

## 2022-11-28 DIAGNOSIS — I340 Nonrheumatic mitral (valve) insufficiency: Secondary | ICD-10-CM

## 2022-11-28 DIAGNOSIS — N319 Neuromuscular dysfunction of bladder, unspecified: Secondary | ICD-10-CM

## 2022-11-28 DIAGNOSIS — N400 Enlarged prostate without lower urinary tract symptoms: Secondary | ICD-10-CM

## 2022-11-28 DIAGNOSIS — E785 Hyperlipidemia, unspecified: Secondary | ICD-10-CM

## 2022-11-28 DIAGNOSIS — I2510 Atherosclerotic heart disease of native coronary artery without angina pectoris: Secondary | ICD-10-CM

## 2022-11-28 DIAGNOSIS — E559 Vitamin D deficiency, unspecified: Secondary | ICD-10-CM

## 2022-11-28 DIAGNOSIS — Z6830 Body mass index (BMI) 30.0-30.9, adult: Secondary | ICD-10-CM

## 2022-11-28 LAB — BAYER DCA HB A1C WAIVED: HB A1C (BAYER DCA - WAIVED): 7.1 % — ABNORMAL HIGH (ref 4.8–5.6)

## 2022-11-28 LAB — MICROALBUMIN, URINE WAIVED
Creatinine, Urine Waived: 100 mg/dL (ref 10–300)
Microalb, Ur Waived: 30 mg/L — ABNORMAL HIGH (ref 0–19)
Microalb/Creat Ratio: <30 mg/g (ref <30)

## 2022-11-28 NOTE — Assessment & Plan Note (Signed)
Chronic, ongoing.  Continue statin therapy daily and adjust as needed.  Recheck lipid panel today. 

## 2022-11-28 NOTE — Assessment & Plan Note (Signed)
Chronic, ongoing.  Continue collaboration with urology and current medication regimen as prescribed by them.  Recent note reviewed. 

## 2022-11-28 NOTE — Assessment & Plan Note (Signed)
Chronic, ongoing.  Continue supplement and adjust as needed.  Labs today. 

## 2022-11-28 NOTE — Assessment & Plan Note (Signed)
Ongoing.  Noted on cardiology notes, continue collaboration with cardiology and current medication regimen ordered by them.

## 2022-11-28 NOTE — Assessment & Plan Note (Addendum)
Chronic, stable.  BP well at goal today in office. Recommend he monitor BP at least a few mornings a week at home and document.  DASH diet at home.  Continue current medication regimen and adjust as needed + collaboration with cardiology.  Labs today: CMP and urine ALB.  Urine ALB 29 November 2022.  Return in 6 months.

## 2022-11-28 NOTE — Assessment & Plan Note (Signed)
Chronic, stable.  Self caths at home, monitor for infections.  Continue collaboration with urology and current medication regimen as prescribed by them.  Recent note reviewed. 

## 2022-11-28 NOTE — Assessment & Plan Note (Signed)
Chronic, stable with no recent chest pain.  Continue collaboration with cardiology and current medication regimen as ordered by them.   

## 2022-11-28 NOTE — Assessment & Plan Note (Addendum)
Chronic, stable. Urine ALB 30 (March 2024) and A1c 7.1% today, trending down. Continue Metformin 500 MG TID, but recommend [facility] increase in Metformin to 1000 MG BID or add on of GLP1.  He obtains all medications from the [place].  Continue Lisinopril for kidney protection.  Recommend he check BS 2-3 times daily and document for provider visits.  Focus on diet changes.  Continue eye exams at the [place].  Return to office in 6 months.

## 2022-11-28 NOTE — Assessment & Plan Note (Signed)
BMI 30.22.  Recommended eating smaller high protein, low fat meals more frequently and exercising 30 mins a day 5 times a week with a goal of 10-15lb weight loss in the next 3 months. Patient voiced their understanding and motivation to adhere to these recommendations.  

## 2022-11-28 NOTE — Progress Notes (Signed)
BP 128/68 (BP Location: Left Arm, Patient Position: Sitting, Cuff Size: Normal)   Pulse 73   Temp 97.6 F (36.4 C) (Oral)   Ht 5' 7.01" (1.702 m)   Wt 193 lb (87.5 kg)   SpO2 96%   BMI 30.22 kg/m    Subjective:    Patient ID: Kyle Richards, male    DOB: 1946-02-11, 77 y.o.   MRN: [id number]  HPI: Kyle Richards is a 77 y.o. male presenting on 11/28/2022 for Medicare Wellness and follow-up. Current medical complaints include:none  He currently lives with: self Interim Problems from his last visit: no  DIABETES A1c in December was 7.4%.  Taking Metformin 500 MG TID -- [facility] to [facility] MG TID.  Taking B12 for low levels, recent levels were improving with supplement. Hypoglycemic episodes:no Polydipsia/polyuria: no Visual disturbance: no Chest pain: no Paresthesias: no Glucose Monitoring: no  Accucheck frequency: Not Checking  Fasting glucose:  Post prandial:  Evening:  Before meals: Taking Insulin?: no  Long acting insulin:  Short acting insulin: Blood Pressure Monitoring: not checking Retinal Examination: Up to Date Foot Exam: Up to Date Pneumovax: Up to Date Influenza: Up to Date Aspirin: yes   HYPERTENSION / HYPERLIPIDEMIA Continues on Atorvastatin, Lisinopril, and Metoprolol.  Followed by cardiology with last visit on 04/14/22, sees once a year.  Has underlying mitral regurgitation. Satisfied with current treatment? yes Duration of hypertension: chronic BP monitoring frequency: not checking BP range:  BP medication side effects: no Duration of hyperlipidemia: chronic Cholesterol medication side effects: no Cholesterol supplements: none Medication compliance: good compliance Aspirin: yes Recent stressors: no Recurrent headaches: no Visual changes: no Palpitations: no Dyspnea: no Chest pain: no Lower extremity edema: no Dizzy/lightheaded: no   BPH Followed by urology with last visit on 10/28/22. Continues on  Flomax and Finasteride.  Has neurogenic bladder and self caths at home every 6 hours. BPH status: stable Satisfied with current treatment?: yes Medication side effects: no Medication compliance: good compliance Duration: chronic Nocturia: no Urinary frequency:no Incomplete voiding: no Urgency: no Weak urinary stream: no Straining to start stream: no Dysuria: no Onset: gradual Severity: mild  Functional Status Survey: Is the patient deaf or have difficulty hearing?: No Does the patient have difficulty seeing, even when wearing glasses/contacts?: No Does the patient have difficulty concentrating, remembering, or making decisions?: No Does the patient have difficulty walking or climbing stairs?: No Does the patient have difficulty dressing or bathing?: No Does the patient have difficulty doing errands alone such as visiting a doctor's office or shopping?: No     09/10/2021    9:37 PM 09/16/2021   12:32 AM 12/30/2021   10:57 AM 08/04/2022    9:44 AM 11/28/2022   10:05 AM  Fall Risk  Falls in the past year?    0 0  Was there an injury with Fall?    0 0  Fall Risk Category Calculator    0 0  Fall Risk Category (Retired)    Low   (RETIRED) Patient Fall Risk Level Low fall risk Low fall risk Low fall risk    Patient at Risk for Falls Due to    No Fall Risks No Fall Risks  Fall risk Follow up    Falls evaluation completed Falls prevention discussed       11/28/2022   10:09 AM 08/04/2022    9:44 AM 05/01/2022   10:40 AM  Depression screen PHQ 2/9  Decreased Interest  1 0 0  Down, Depressed, Hopeless 0 0 0  PHQ - 2 Score 1 0 0  Altered sleeping 0 0 0  Tired, decreased energy 0 0 0  Change in appetite 2 0 0  Feeling bad or failure about yourself  0 0 0  Trouble concentrating 0 0 0  Moving slowly or fidgety/restless 0 0 0  Suicidal thoughts 0 0 0  PHQ-9 Score 3 0 0  Difficult doing work/chores Not difficult at all Not difficult at all Not difficult at all       11/28/2022   10:09  AM 08/04/2022    9:44 AM 05/01/2022   10:40 AM  GAD 7 : Generalized Anxiety Score  Nervous, Anxious, on Edge 0 0 0  Control/stop worrying 2 3 0  Worry too much - different things 2 0 0  Trouble relaxing 0 0 0  Restless 0 0 0  Easily annoyed or irritable 0 0 0  Afraid - awful might happen 0 0 0  Total GAD 7 Score 4 3 0  Anxiety Difficulty Not difficult at all Not difficult at all Not difficult at all   Advanced Directives Does patient have a HCPOA?    no If yes, name and contact information:  Does patient have a living will or MOST form?  no  Past Medical History:  Past Medical History:  Diagnosis Date   Diabetes ([place])    Heart attack ([place]) 2012   High cholesterol    Hypertension    Neurogenic bladder disorder    Neuropathy     Surgical History:  Past Surgical History:  Procedure Laterality Date   FINGER FRACTURE SURGERY Left    middle finger left hand   OTHER SURGICAL HISTORY     Heart Bypass   TONSILLECTOMY AND ADENOIDECTOMY  1953    Medications:  Current Outpatient Medications on File Prior to Visit  Medication Sig   aspirin 81 MG chewable tablet Chew 81 mg by mouth daily.   atorvastatin (LIPITOR) 40 MG tablet Take 40 mg by mouth daily.   cyanocobalamin (VITAMIN B12) 500 MCG tablet Take 2 tablets by mouth daily.   finasteride (PROSCAR) 5 MG tablet Take 5 mg by mouth daily.   lisinopril (ZESTRIL) 20 MG tablet Take 20 mg by mouth daily.   metFORMIN (GLUCOPHAGE) 500 MG tablet Take 500 mg by mouth 3 (three) times daily.   metoprolol succinate (TOPROL XL) 25 MG 24 hr tablet Take 1 tablet (25 mg total) by mouth daily.   tamsulosin (FLOMAX) 0.4 MG CAPS capsule Take 1 capsule (0.4 mg total) by mouth daily.   VITAMIN D PO Take 1,000 Units by mouth daily.   No current facility-administered medications on file prior to visit.    Allergies:  Allergies  Allergen Reactions   Fentanyl Other (See Comments)    "Stopped breathing"    Social History:  Social History    Socioeconomic History   Marital status: Single    Spouse name: Not on file   Number of children: 2   Years of education: Not on file   Highest education level: Doctorate  Occupational History   Not on file  Tobacco Use   Smoking status: Never    Passive exposure: Never   Smokeless tobacco: Never  Vaping Use   Vaping Use: Never used  Substance and Sexual Activity   Alcohol use: Yes    Comment: 1 beer monthly   Drug use: Never   Sexual activity: Not Currently  Other Topics Concern   Not on file  Social History Narrative   Not on file   Social Determinants of Health   Financial Resource Strain: Low Risk  (11/28/2022)   Overall Financial Resource Strain (CARDIA)    Difficulty of Paying Living Expenses: Not hard at all  Food Insecurity: No Food Insecurity (11/28/2022)   Hunger Vital Sign    Worried About Running Out of Food in the Last Year: Never true    Ran Out of Food in the Last Year: Never true  Transportation Needs: No Transportation Needs (11/28/2022)   PRAPARE - Hydrologist (Medical): No    Lack of Transportation (Non-Medical): No  Recent Concern: Transportation Needs - Unmet Transportation Needs (11/24/2022)   PRAPARE - Hydrologist (Medical): Yes    Lack of Transportation (Non-Medical): No  Physical Activity: Insufficiently Active (11/28/2022)   Exercise Vital Sign    Days of Exercise per Week: 2 days    Minutes of Exercise per Session: 20 min  Stress: No Stress Concern Present (11/28/2022)   [place]    Feeling of Stress : Only a little  Social Connections: Socially Isolated (11/28/2022)   Social Connection and Isolation Panel [NHANES]    Frequency of Communication with Friends and Family: Three times a week    Frequency of Social Gatherings with Friends and Family: Three times a week    Attends Religious Services: Never    Active Member  of Clubs or Organizations: No    Attends Archivist Meetings: Never    Marital Status: Divorced  Human resources officer Violence: Not At Risk (11/28/2022)   Humiliation, Afraid, Rape, and Kick questionnaire    Fear of Current or Ex-Partner: No    Emotionally Abused: No    Physically Abused: No    Sexually Abused: No   Social History   Tobacco Use  Smoking Status Never   Passive exposure: Never  Smokeless Tobacco Never   Social History   Substance and Sexual Activity  Alcohol Use Yes   Comment: 1 beer monthly    Family History:  Family History  Adopted: Yes  Family history unknown: Yes    Past medical history, surgical history, medications, allergies, family history and social history reviewed with patient today and changes made to appropriate areas of the chart.   ROS All other ROS negative except what is listed above and in the HPI.      Objective:    BP 128/68 (BP Location: Left Arm, Patient Position: Sitting, Cuff Size: Normal)   Pulse 73   Temp 97.6 F (36.4 C) (Oral)   Ht 5' 7.01" (1.702 m)   Wt 193 lb (87.5 kg)   SpO2 96%   BMI 30.22 kg/m   Wt Readings from Last 3 Encounters:  11/28/22 193 lb (87.5 kg)  10/28/22 197 lb (89.4 kg)  08/04/22 197 lb 3.2 oz (89.4 kg)    Physical Exam Vitals and nursing note reviewed.  Constitutional:      General: He is awake. He is not in acute distress.    Appearance: He is well-developed and well-groomed. He is obese. He is not ill-appearing or toxic-appearing.  HENT:     Head: Normocephalic and atraumatic.     Right Ear: Hearing and external ear normal. No drainage.     Left Ear: Hearing and external ear normal. No drainage.  Eyes:  General: Lids are normal.        Right eye: No discharge.        Left eye: No discharge.     Conjunctiva/sclera: Conjunctivae normal.     Pupils: Pupils are equal, round, and reactive to light.  Neck:     Thyroid: No thyromegaly.     Vascular: No carotid bruit.     Trachea:  Trachea normal.  Cardiovascular:     Rate and Rhythm: Normal rate and regular rhythm.     Heart sounds: S1 normal and S2 normal. Murmur heard.     Systolic murmur is present with a grade of 2/6.     No gallop.  Pulmonary:     Effort: Pulmonary effort is normal. No accessory muscle usage or respiratory distress.     Breath sounds: Normal breath sounds.  Abdominal:     General: Bowel sounds are normal.     Palpations: Abdomen is soft.  Musculoskeletal:        General: Normal range of motion.     Cervical back: Normal range of motion and neck supple.     Right lower leg: No edema.     Left lower leg: No edema.  Lymphadenopathy:     Cervical: No cervical adenopathy.  Skin:    General: Skin is warm and dry.     Capillary Refill: Capillary refill takes less than 2 seconds.  Neurological:     Mental Status: He is alert and oriented to person, place, and time.     Deep Tendon Reflexes: Reflexes are normal and symmetric.     Reflex Scores:      Brachioradialis reflexes are 2+ on the right side and 2+ on the left side.      Patellar reflexes are 2+ on the right side and 2+ on the left side. Psychiatric:        Attention and Perception: Attention normal.        Mood and Affect: Mood normal.        Speech: Speech normal.        Behavior: Behavior normal. Behavior is cooperative.        Thought Content: Thought content normal.       11/28/2022    7:49 PM 11/28/2022   10:08 AM  6CIT Screen  What Year? 0 points 0 points  What month? 0 points 0 points  What time? 0 points 0 points  Count back from 20 0 points 0 points  Months in reverse 0 points 0 points  Repeat phrase 0 points 0 points  Total Score 0 points 0 points   Results for orders placed or performed in visit on 11/28/22  Bayer DCA Hb A1c Waived  Result Value Ref Range   HB A1C (BAYER DCA - WAIVED) 7.1 (H) 4.8 - 5.6 %  Microalbumin, Urine Waived  Result Value Ref Range   Microalb, Ur Waived 30 (H) 0 - 19 mg/L    Creatinine, Urine Waived 100 10 - 300 mg/dL   Microalb/Creat Ratio <30 <30 mg/g      Assessment & Plan:   Problem List Items Addressed This Visit       Cardiovascular and Mediastinum   Coronary artery disease involving native coronary artery of native heart without angina pectoris    Chronic, stable with no recent chest pain.  Continue collaboration with cardiology and current medication regimen as ordered by them.        Hypertension associated with diabetes ([place])  Chronic, stable.  BP well at goal today in office. Recommend he monitor BP at least a few mornings a week at home and document.  DASH diet at home.  Continue current medication regimen and adjust as needed + collaboration with cardiology.  Labs today: CMP and urine ALB.  Urine ALB 29 November 2022.  Return in 6 months.      Relevant Orders   Bayer DCA Hb A1c Waived (Completed)   Microalbumin, Urine Waived (Completed)   Comprehensive metabolic panel   Nonrheumatic mitral valve regurgitation    Ongoing.  Noted on cardiology notes, continue collaboration with cardiology and current medication regimen ordered by them.        Endocrine   Diabetes mellitus type 2 without retinopathy (HCC)    Chronic, stable. Urine ALB 30 (March 2024) and A1c 7.1% today, trending down. Continue Metformin 500 MG TID, but recommend he [facility] in Metformin to 1000 MG BID or add on of GLP1.  He obtains all medications from the [place].  Continue Lisinopril for kidney protection.  Recommend he check BS 2-3 times daily and document for provider visits.  Focus on diet changes.  Continue eye exams at the [place].  Return to office in 6 months.      Relevant Orders   Bayer DCA Hb A1c Waived (Completed)   Microalbumin, Urine Waived (Completed)   Comprehensive metabolic panel   Hyperlipidemia associated with type 2 diabetes mellitus (HCC)    Chronic, ongoing.  Continue statin therapy daily and adjust as needed.  Recheck lipid panel today.       Relevant Orders   Bayer DCA Hb A1c Waived (Completed)   Comprehensive metabolic panel   Lipid Panel w/o Chol/HDL Ratio   Type 2 diabetes mellitus with proteinuria (HCC)    Chronic, stable. Urine ALB 30 (March 2024) and A1c 7.1% today, trending down. Continue Metformin 500 MG TID, but recommend he discuss with [facility] increase in Metformin to 1000 MG BID or add on of GLP1.  He obtains all medications from the [place].  Continue Lisinopril for kidney protection.  Recommend he check BS 2-3 times daily and document for provider visits.  Focus on diet changes.  Return to office in 6 months, has PCP at [place] he sees between visits with local PCP.      Relevant Orders   Bayer DCA Hb A1c Waived (Completed)   Microalbumin, Urine Waived (Completed)   Comprehensive metabolic panel     Genitourinary   BPH without urinary obstruction    Chronic, ongoing.  Continue collaboration with urology and current medication regimen as prescribed by them.  Recent note reviewed.        Other   B12 deficiency anemia    Chronic, ongoing.  Continue supplement and adjust as needed.  Labs today.      Relevant Orders   Vitamin B12   Neurogenic bladder    Chronic, stable.  Self caths at home, monitor for infections.  Continue collaboration with urology and current medication regimen as prescribed by them.  Recent note reviewed.      Obesity    BMI 30.22.  Recommended eating smaller high protein, low fat meals more frequently and exercising 30 mins a day 5 times a week with a goal of 10-15lb weight loss in the next 3 months. Patient voiced their understanding and motivation to adhere to these recommendations.       Other Visit Diagnoses     Medicare annual  wellness visit, initial    -  Primary   Medicare wellness due and performed with patient today.       Discussed aspirin prophylaxis for myocardial infarction prevention and decision was made to continue ASA  LABORATORY TESTING:  Health maintenance labs  ordered today as discussed above.   IMMUNIZATIONS:   - Tdap: Tetanus vaccination status reviewed: last tetanus booster within 10 years. - Influenza: Refused - Pneumovax: Up to date - Prevnar: Up to date - Zostavax vaccine: Up to date  SCREENING: - Colonoscopy: Not applicable  Discussed with patient purpose of the colonoscopy is to detect colon cancer at curable precancerous or early stages   - AAA Screening: Not applicable  -Hearing Test: Not applicable  -Spirometry: Not applicable   PATIENT COUNSELING:    Sexuality: Discussed sexually transmitted diseases, partner selection, use of condoms, avoidance of unintended pregnancy  and contraceptive alternatives.   Advised to avoid cigarette smoking.  I discussed with the patient that most people either abstain from alcohol or drink within safe limits (<=14/week and <=4 drinks/occasion for males, <=7/weeks and <= 3 drinks/occasion for females) and that the risk for alcohol disorders and other health effects rises proportionally with the number of drinks per week and how often a drinker exceeds daily limits.  Discussed cessation/primary prevention of drug use and availability of treatment for abuse.   Diet: Encouraged to adjust caloric intake to maintain  or achieve ideal body weight, to reduce intake of dietary saturated fat and total fat, to limit sodium intake by avoiding high sodium foods and not adding table salt, and to maintain adequate dietary potassium and calcium preferably from fresh fruits, vegetables, and low-fat dairy products.    Stressed the importance of regular exercise  Injury prevention: Discussed safety belts, safety helmets, smoke detector, smoking near bedding or upholstery.   Dental health: Discussed importance of regular tooth brushing, flossing, and dental visits.   Follow up plan: NEXT PREVENTATIVE PHYSICAL DUE IN 1 YEAR. Return in about 6 months (around 05/31/2023) for T2DM, HTN/HLD, BPH.

## 2022-11-28 NOTE — Assessment & Plan Note (Addendum)
Chronic, stable. Urine ALB 30 (March 2024) and A1c 7.1% today, trending down. Continue Metformin 500 MG TID, but [facility] increase in Metformin to 1000 MG BID or add on of GLP1.  He obtains all medications from the [place].  Continue Lisinopril for kidney protection.  Recommend he check BS 2-3 times daily and document for provider visits.  Focus on diet changes.  Return to office in 6 months, has PCP at [place] he sees between visits with local PCP.

## 2022-11-29 LAB — COMPREHENSIVE METABOLIC PANEL
ALT: 16 IU/L (ref 0–44)
AST: 12 IU/L (ref 0–40)
Albumin/Globulin Ratio: 2 (ref 1.2–2.2)
Albumin: 4.5 g/dL (ref 3.8–4.8)
Alkaline Phosphatase: 82 IU/L (ref 44–121)
BUN/Creatinine Ratio: 18 (ref 10–24)
BUN: 21 mg/dL (ref 8–27)
Bilirubin Total: 0.4 mg/dL (ref 0.0–1.2)
CO2: 22 mmol/L (ref 20–29)
Calcium: 9.2 mg/dL (ref 8.6–10.2)
Chloride: 104 mmol/L (ref 96–106)
Creatinine, Ser: 1.15 mg/dL (ref 0.76–1.27)
Globulin, Total: 2.2 g/dL (ref 1.5–4.5)
Glucose: 148 mg/dL — ABNORMAL HIGH (ref 70–99)
Potassium: 4.5 mmol/L (ref 3.5–5.2)
Sodium: 140 mmol/L (ref 134–144)
Total Protein: 6.7 g/dL (ref 6.0–8.5)
eGFR: 66 mL/min/{1.73_m2} (ref >59)

## 2022-11-29 LAB — LIPID PANEL W/O CHOL/HDL RATIO
Cholesterol, Total: 129 mg/dL (ref 100–199)
HDL: 46 mg/dL (ref >39)
LDL Chol Calc (NIH): 67 mg/dL (ref 0–99)
Triglycerides: 79 mg/dL (ref 0–149)
VLDL Cholesterol Cal: 16 mg/dL (ref 5–40)

## 2022-11-29 LAB — VITAMIN B12: Vitamin B-12: 820 pg/mL (ref 232–1245)

## 2022-11-29 NOTE — Progress Notes (Signed)
Contacted via MyChart   Good evening Kyle Richards, your labs have returned.  Overall they continue to look great: - Kidney function, creatinine and eGFR, remains normal, as is liver function, AST and ALT.  - Cholesterol levels stable. - B12 level remains stable.  Any questions? Keep being amazing!!  Thank you for allowing me to participate in your care.  I appreciate you. Kindest regards, Chao Blazejewski

## 2023-01-09 ENCOUNTER — Encounter: Payer: No Typology Code available for payment source | Admitting: Cardiovascular Disease

## 2023-02-27 ENCOUNTER — Encounter: Payer: Self-pay | Admitting: Internal Medicine

## 2023-02-27 ENCOUNTER — Telehealth: Payer: Self-pay | Admitting: *Deleted

## 2023-02-27 ENCOUNTER — Ambulatory Visit: Payer: Medicare Other | Attending: Internal Medicine | Admitting: Internal Medicine

## 2023-02-27 VITALS — BP 138/78 | HR 65 | Ht 68.0 in | Wt 192.2 lb

## 2023-02-27 DIAGNOSIS — I2510 Atherosclerotic heart disease of native coronary artery without angina pectoris: Secondary | ICD-10-CM

## 2023-02-27 DIAGNOSIS — R0683 Snoring: Secondary | ICD-10-CM

## 2023-02-27 DIAGNOSIS — Z951 Presence of aortocoronary bypass graft: Secondary | ICD-10-CM | POA: Diagnosis not present

## 2023-02-27 DIAGNOSIS — I10 Essential (primary) hypertension: Secondary | ICD-10-CM | POA: Diagnosis not present

## 2023-02-27 DIAGNOSIS — E782 Mixed hyperlipidemia: Secondary | ICD-10-CM | POA: Diagnosis not present

## 2023-02-27 NOTE — Patient Instructions (Signed)
Medication Instructions:  Your physician recommends that you continue on your current medications as directed. Please refer to the Current Medication list given to you today.  *If you need a refill on your cardiac medications before your next appointment, please call your pharmacy*  Lab Work: None ordered today.  Testing/Procedures: Your physician has recommended that you have a sleep study. This test records several body functions during sleep, including: brain activity, eye movement, oxygen and carbon dioxide blood levels, heart rate and rhythm, breathing rate and rhythm, the flow of air through your mouth and nose, snoring, body muscle movements, and chest and belly movement.   Follow-Up: At [facility], you and your health needs are our priority.  As part of our continuing mission to provide you with exceptional heart care, we have created designated Provider Care Teams.  These Care Teams include your primary Cardiologist (physician) and Advanced Practice Providers (APPs -  Physician Assistants and Nurse Practitioners) who all work together to provide you with the care you need, when you need it.  Your next appointment:   6 month(s)  The format for your next appointment:   In Person  Provider:   Dietrich Pates, MD {

## 2023-02-27 NOTE — Telephone Encounter (Signed)
Dr. Tenny Craw ordered the pt an [place] sleep study today. Pt agreeable to signed waiver and not open the box until he has been called with the PIN#.

## 2023-02-27 NOTE — Progress Notes (Unsigned)
Cardiology Office Note:    Date:  02/27/2023   ID:  Kyle Richards, DOB 1946-03-07, MRN [id number]  PCP:  Marjie Skiff, NP      History of Present Illness:    Kyle Richards is a 77 y.o. male with a hx of CAD (s/p CABG in 2011), HTN (labile), mitral regurgitation, HLD, T2DM  Patient last seen in clinc in Aug 2023  He also gets care in [place] where his son lives    On February 05 2023 the pt was sitting in chair  fell asleep /became unconscoius   Son couldn't wake him   EMS called     Hospitalized   at [facility]  Review of records  No arrhythmias noted on tele.  Trop flat  Echo showed normal LVEF   MIld MR   Urine reported + for benzodiazepines, which he had not been prescribed     Sent home on 02/06/23 ith monitor  He has worn it but not mailed in yet   Since d/c he hs not had any further spells  He has been to [place] and back   He denies dizziness  No palpitations.   No SOB  No CP   Past Medical History:  Diagnosis Date   Diabetes (HCC)    Heart attack (HCC) 2012   High cholesterol    Hypertension    Neurogenic bladder disorder    Neuropathy     Past Surgical History:  Procedure Laterality Date   FINGER FRACTURE SURGERY Left    middle finger left hand   OTHER SURGICAL HISTORY     Heart Bypass   TONSILLECTOMY AND ADENOIDECTOMY  1953    Current Medications: Current Meds  Medication Sig   aspirin 81 MG chewable tablet Chew 81 mg by mouth daily.   atorvastatin (LIPITOR) 40 MG tablet Take 40 mg by mouth daily.   cyanocobalamin (VITAMIN B12) 500 MCG tablet Take 2 tablets by mouth daily.   finasteride (PROSCAR) 5 MG tablet Take 5 mg by mouth daily.   lisinopril (ZESTRIL) 20 MG tablet Take 20 mg by mouth daily.   metFORMIN (GLUCOPHAGE) 500 MG tablet Take 500 mg by mouth 3 (three) times daily.   metoprolol succinate (TOPROL XL) 25 MG 24 hr tablet Take 1 tablet (25 mg total) by mouth daily.   tamsulosin (FLOMAX) 0.4 MG CAPS capsule Take 1 capsule  (0.4 mg total) by mouth daily.   VITAMIN D PO Take 1,000 Units by mouth daily.     Allergies:   Fentanyl   Social History   Socioeconomic History   Marital status: Single    Spouse name: Not on file   Number of children: 2   Years of education: Not on file   Highest education level: Doctorate  Occupational History   Not on file  Tobacco Use   Smoking status: Never    Passive exposure: Never   Smokeless tobacco: Never  Vaping Use   Vaping Use: Never used  Substance and Sexual Activity   Alcohol use: Yes    Comment: 1 beer monthly   Drug use: Never   Sexual activity: Not Currently  Other Topics Concern   Not on file  Social History Narrative   Not on file   Social Determinants of Health   Financial Resource Strain: Low Risk  (11/28/2022)   Overall Financial Resource Strain (CARDIA)    Difficulty of Paying Living Expenses: Not hard at all  Food Insecurity: No  Food Insecurity (11/28/2022)   Hunger Vital Sign    Worried About Running Out of Food in the Last Year: Never true    Ran Out of Food in the Last Year: Never true  Transportation Needs: No Transportation Needs (11/28/2022)   PRAPARE - Administrator, Civil Service (Medical): No    Lack of Transportation (Non-Medical): No  Recent Concern: Transportation Needs - Unmet Transportation Needs (11/24/2022)   PRAPARE - Administrator, Civil Service (Medical): Yes    Lack of Transportation (Non-Medical): No  Physical Activity: Insufficiently Active (11/28/2022)   Exercise Vital Sign    Days of Exercise per Week: 2 days    Minutes of Exercise per Session: 20 min  Stress: No Stress Concern Present (11/28/2022)   [employer] of Occupational Health - Occupational Stress Questionnaire    Feeling of Stress : Only a little  Social Connections: Socially Isolated (11/28/2022)   Social Connection and Isolation Panel [NHANES]    Frequency of Communication with Friends and Family: Three times a week     Frequency of Social Gatherings with Friends and Family: Three times a week    Attends Religious Services: Never    Active Member of Clubs or Organizations: No    Attends Banker Meetings: Never    Marital Status: Divorced     Family History: The patient's He was adopted. Family history is unknown by patient.  ROS:   Review of Systems  Constitutional:  Negative for fever and malaise/fatigue.  HENT:  Negative for congestion and ear pain.   Respiratory:  Negative for cough, shortness of breath and wheezing.   Cardiovascular:  Negative for chest pain, palpitations, orthopnea, claudication, leg swelling and PND.  Gastrointestinal:  Negative for diarrhea and nausea.  Musculoskeletal:  Positive for myalgias.  Neurological:  Negative for loss of consciousness.     EKGs/Labs/Other Studies Reviewed:    TTE  2023   Conclusions        1: The left ventricular cavity size is normal.                      2: Left ventricular ejection fraction is normal, estimated at 55-60%.                        3: No regional wall motion abnormalities.                        4: There is normal diastolic function.                      5: Right ventricle is normal in size and systolic function.                      6: Left atrium is mildly dilated.                      7: Aortic valve sclerosis without significant stenosis or regurgitation.                      8: Mild mitral annular calcifications without significant stenosis. Mild mitral regurgitation.                      9: Estimated PASP of 26 mmHg.  10: Compared to the study on 12/12/20; MR was previously noted to be moderate. Estimated PASP has                        improved from prior 40-45 mmHg.   EKG:   No new ECG today   Recent Labs: 05/01/2022: Hemoglobin 15.2; Platelets 234; TSH 0.862 11/28/2022: ALT 16; BUN 21; Creatinine, Ser 1.15; Potassium 4.5; Sodium 140  Recent Lipid Panel    Component Value Date/Time    CHOL 129 11/28/2022 0951   TRIG 79 11/28/2022 0951   HDL 46 11/28/2022 0951   LDLCALC 67 11/28/2022 0951         Physical Exam:    VS:  BP 138/78   Pulse 65   Ht 5\' 8"  (1.727 m)   Wt 192 lb 3.2 oz (87.2 kg)   SpO2 96%   BMI 29.22 kg/m     Wt Readings from Last 3 Encounters:  02/27/23 192 lb 3.2 oz (87.2 kg)  11/28/22 193 lb (87.5 kg)  10/28/22 197 lb (89.4 kg)     GEN: Well nourished, well developed in no acute distress HEENT: Normal NECK: No JVD; No carotid bruit CARDIAC: RRR, no significant murmurs  RESPIRATORY:  Clear to auscultation  ABDOMEN: Soft, non-tender, non-distended MUSCULOSKELETAL:  No edema;  ASSESSMENT:     1  Syncope   Recent spell  Work up negative so far   I have recomm he mail in monitor and have records sent back for review For now no further testing   Note urin did have benzodiazepines  ? Contributed.   Counselled on no driving for 6 mon unless cause found  2  CAD   CABG in 2011   No symptoms of CP   Will review records  Consider noninvasive eval  3  LIpids  LDL 67  HDL 46  Trig 79 in March 2024  4  HTN  BP fair  Geven event I would not push futher    He says it runs in 120s systolic at home      5  ? Sleep apnea   Will set up for sleep evaluation         #CAD s/p CABG on 2011: Doing well without anginal symptoms. Tolerating medications as prescribed. TTE  11/2020 with LVEF 55-60%, no WMA, G2DD, moderate MR. Repeat TTE in 01/2022 with LVEF 55-60%, mild MR. -Continue metop succinate 25mg  XL daily -Continue lisinopril 20mg  daily -Continue lipitor 20mg  daily -Continue ASA 81mg  daily  #Moderate MR>mild: Improved to mild based on TTE in 02/21/22. -Monitor with repeat echo in 2025  #HTN: Well controlled at home with recent increase in lisinopril. No orthostatic symptoms. -Continue metop succinate 25mg  XL daily -Continue lisinopril 20mg  daily  #Episode of low blood pressure/suspected orthostasis: Presented to ER in 12/2020 with soft  blood pressures. Orthostatics negative and work-up reassruing. No current symptoms.  -Resolved  #HLD: -Continue lipitor 20mg  daily -Labs monitored at [facility]; goal LDL<70  #DMII: -Continue metformin 500mg  BID  #Neurogenic Bladder: -Self catheterizes     Medication Adjustments/Labs and Tests Ordered: Current medicines are reviewed at length with the patient today.  Concerns regarding medicines are outlined above.  Orders Placed This Encounter  Procedures   EKG 12-Lead    No orders of the defined types were placed in this encounter.   There are no Patient Instructions on file for this visit.      I,Zite Okoli,acting as a Neurosurgeon  for Dietrich Pates, MD.,have documented all relevant documentation on the behalf of Dietrich Pates, MD,as directed by  Dietrich Pates, MD while in the presence of Dietrich Pates, MD.   I, Dietrich Pates, MD, have reviewed all documentation for this visit. The documentation on 02/27/23 for the exam, diagnosis, procedures, and orders are all accurate and complete.   Signed, Dietrich Pates, MD  02/27/2023 9:10 AM    [facility]

## 2023-03-03 ENCOUNTER — Encounter: Payer: Self-pay | Admitting: Internal Medicine

## 2023-03-09 NOTE — Telephone Encounter (Signed)
**Note De-Identified Cypher Paule Obfuscation** Per [facility] Inquiry response: Notification or Prior Authorization is not required for the requested services ([place]). Decision ID #: [id number]  I have made the pt aware of this and I provided him with his activation code "[email or web address]". He verbalized understanding and thanked me for my call.

## 2023-03-14 ENCOUNTER — Encounter (INDEPENDENT_AMBULATORY_CARE_PROVIDER_SITE_OTHER): Payer: Medicare Other | Admitting: Cardiology

## 2023-03-14 DIAGNOSIS — G4733 Obstructive sleep apnea (adult) (pediatric): Secondary | ICD-10-CM

## 2023-03-16 ENCOUNTER — Encounter: Payer: Self-pay | Admitting: Internal Medicine

## 2023-03-19 IMAGING — CR DG CHEST 2V
2 series · 2 of 2 positions shown · non-contrast
Comparison: Chest x-ray 01/22/2021

CLINICAL DATA: covid, weakness

EXAM:
CHEST - 2 VIEW

[chest pa]
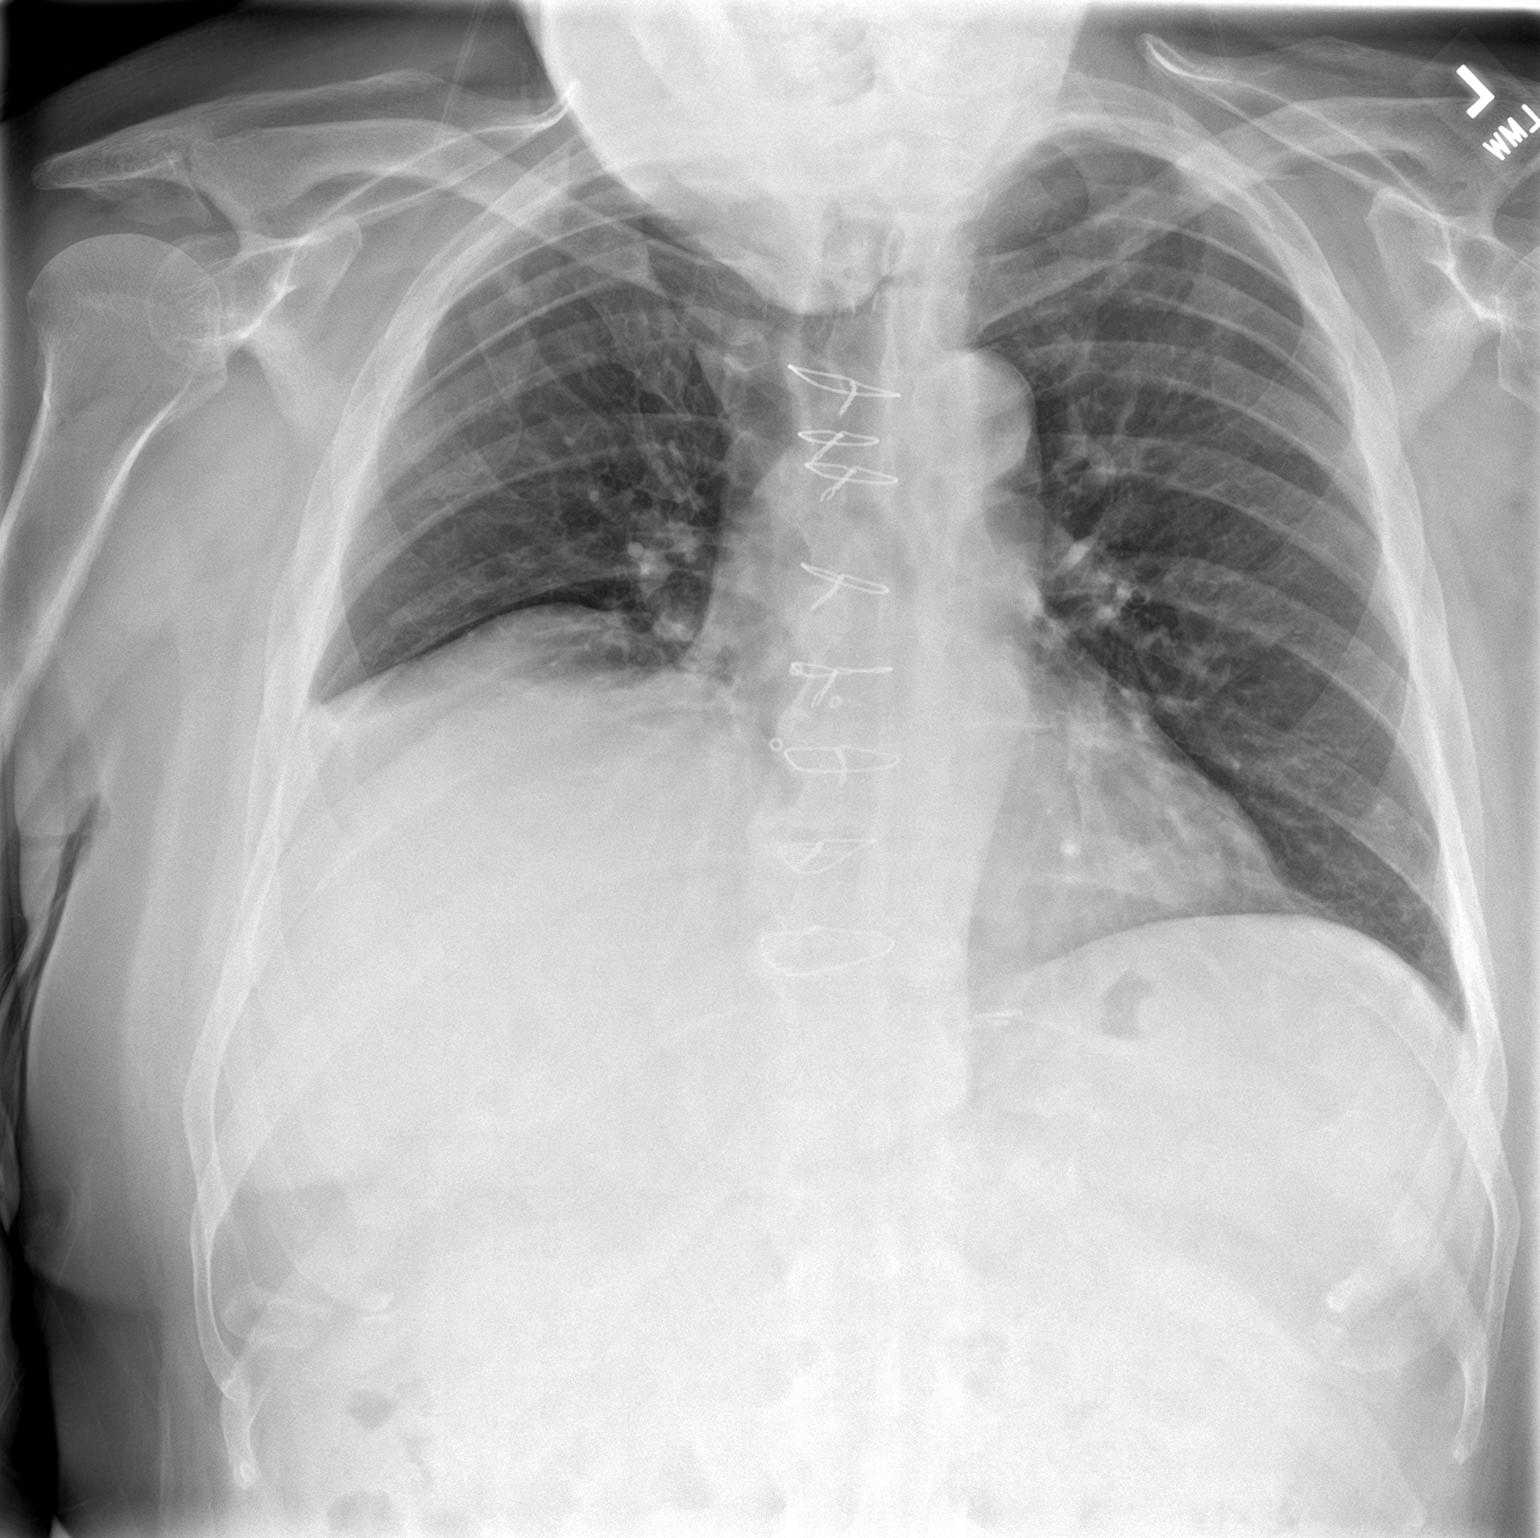

[chest lat]
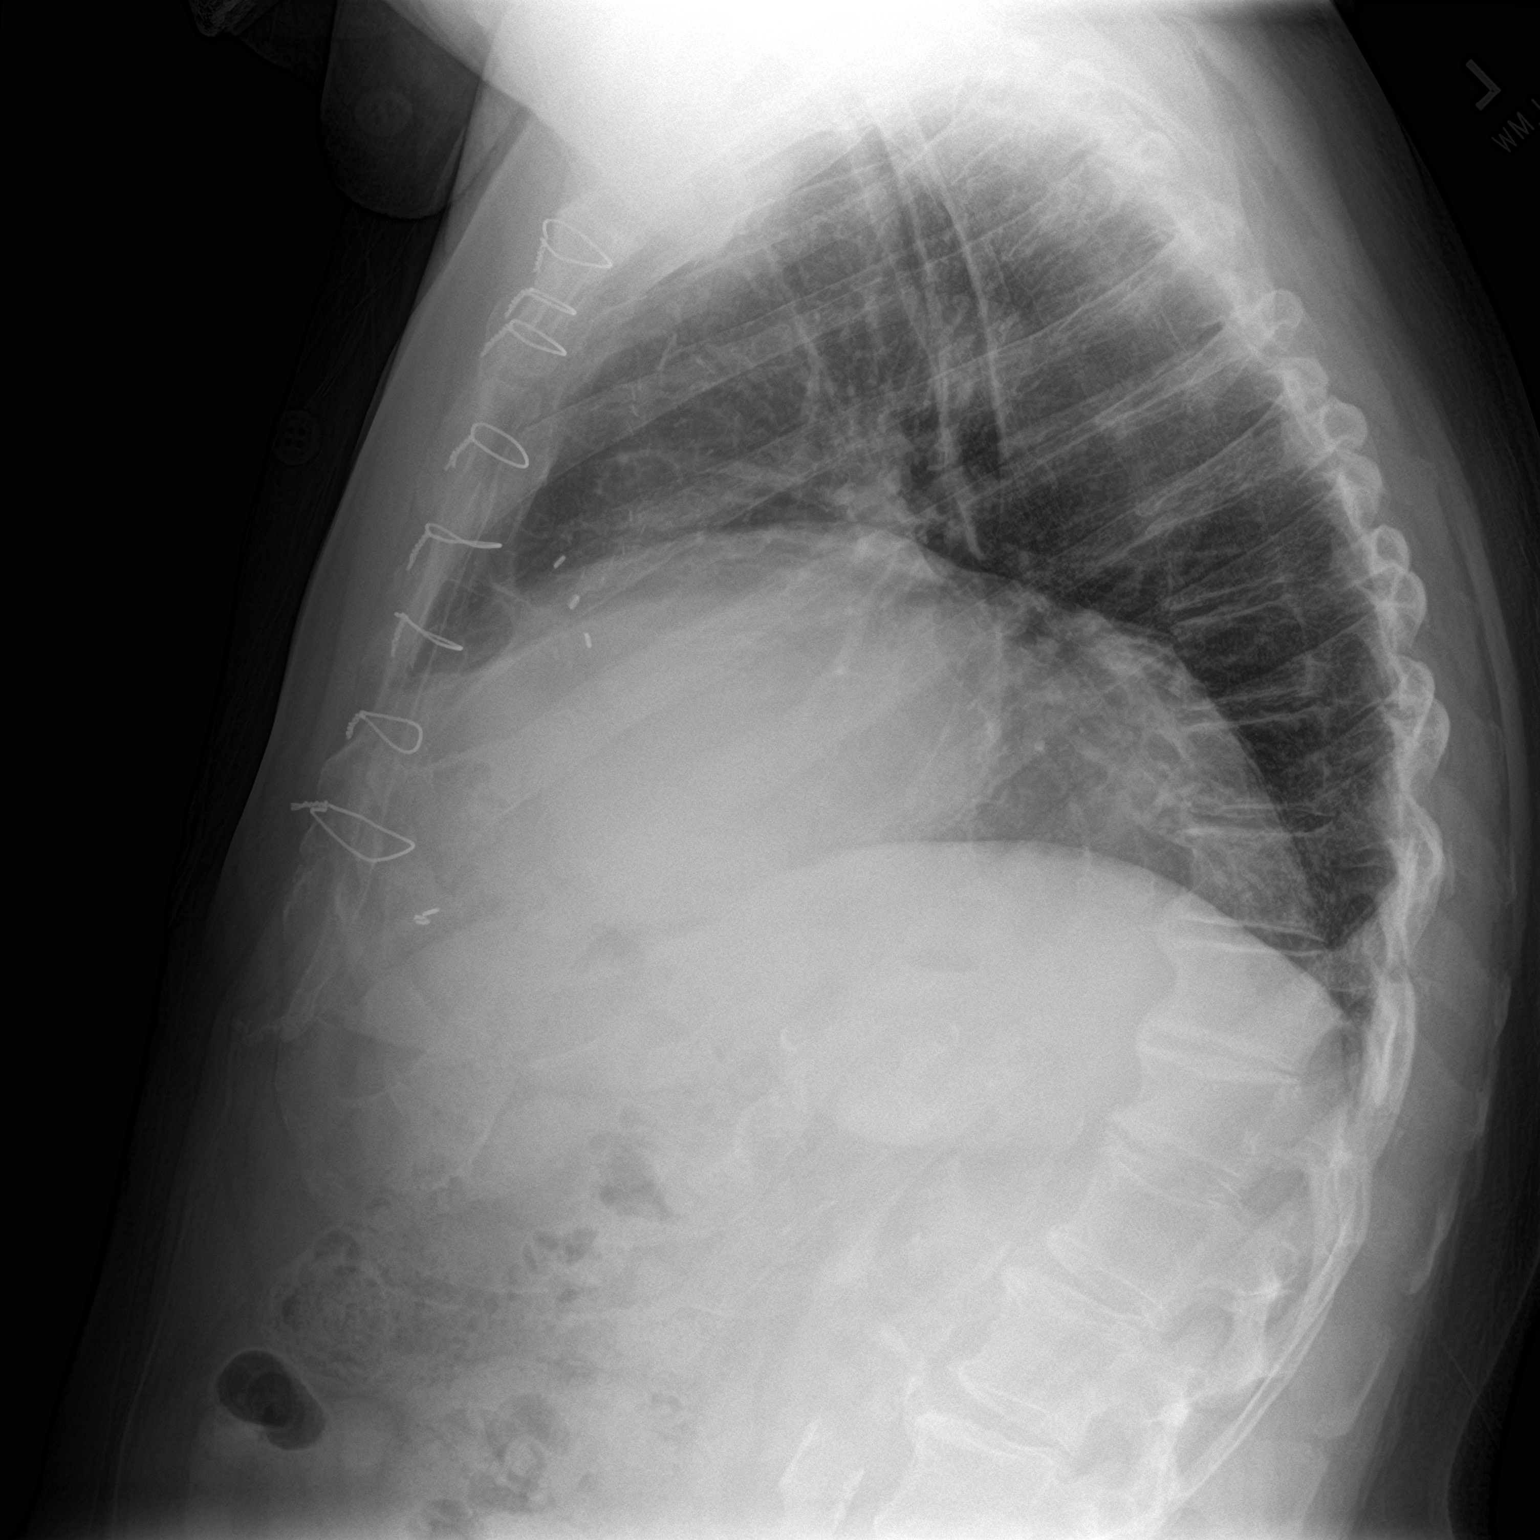

[2 of 2 positions shown; findings below may reference images not displayed]

FINDINGS: The heart and mediastinal contours are unchanged. Aortic
calcification. Cardiac surgical changes overlie the mediastinum.

Right apex poorly visualized due to overlying mandible. Elevated
right hemidiaphragm with right base atelectasis. No focal
consolidation. No pulmonary edema. Nonspecific blunting of the right
costophrenic angle. No pleural effusion. No pneumothorax.

No acute osseous abnormality.
IMPRESSION: 1. Persistent elevated right hemidiaphragm with no active
cardiopulmonary disease.
2.  Aortic Atherosclerosis ([id number]).

## 2023-03-20 ENCOUNTER — Ambulatory Visit: Payer: No Typology Code available for payment source | Attending: Internal Medicine

## 2023-03-20 DIAGNOSIS — R0683 Snoring: Secondary | ICD-10-CM

## 2023-03-20 NOTE — Procedures (Addendum)
   SLEEP STUDY REPORT Patient Information Study Date: 03/14/2023 Patient Name: Kyle Richards Patient ID: [id number] Birth Date: [birth year] Age: 77 Gender: Male BMI: 29.1 (W=192 lb, H=5' 8'') Stopbang: 5 Referring MD:  Dietrich Pates, MD  TEST DESCRIPTION: Home sleep apnea testing was completed using the WatchPat, a Type 1 device, utilizing  peripheral arterial tonometry (PAT), chest movement, actigraphy, pulse oximetry, pulse rate, body position and snore.  AHI was calculated with apnea and hypopnea using valid sleep time as the denominator. RDI includes apneas,  hypopneas, and RERAs. The data acquired and the scoring of sleep and all associated events were performed in  accordance with the recommended standards and specifications as outlined in the AASM Manual for the Scoring of  Sleep and Associated Events 2.2.0 (2015).  FINDINGS:  1. Moderate Obstructive Sleep Apnea with AHI 25.4/hr.   2. No Central Sleep Apnea with pAHIc 6.6/hr.  3. Oxygen desaturations as low as 86%.  4. Severe snoring was present. O2 sats were < 88% for 0.3 min.  5. Total sleep time was 4 hrs and 43 min.  6. 15.1% of total sleep time was spent in REM sleep.   7. Normal sleep onset latency at 19 min  8. Shortened REM sleep onset latency at 78 min.   9. Total awakenings were 5.  10. 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 Clinical correlation of these findings is necessary. The decision to treat obstructive sleep apnea (OSA) is usually  based on the presence of apnea symptoms or the presence of associated medical conditions such as Hypertension,  Congestive Heart Failure, Atrial Fibrillation or Obesity. The most common symptoms of OSA are snoring, gasping for  breath while sleeping, daytime sleepiness and fatigue.  2. Initiating apnea therapy is recommended given the presence of symptoms and/or associated conditions.  Recommend proceeding with one  of the following:  a. Auto-CPAP therapy with a pressure range of 5-20cm H2O.  b. An oral appliance (OA) that can be obtained from certain dentists with expertise in sleep medicine. These are  primarily of use in non-obese patients with mild and moderate disease.  c. An ENT consultation which may be useful to look for specific causes of obstruction and possible treatment  options.  d. If patient is intolerant to PAP therapy, consider referral to ENT for evaluation for hypoglossal nerve stimulator.  3. Close follow-up is necessary to ensure success with CPAP or oral appliance therapy for maximum benefit . 4. A follow-up oximetry study on CPAP is recommended to assess the adequacy of therapy and determine the need  for supplemental oxygen or the potential need for Bi-level therapy. An arterial blood gas to determine the adequacy of  baseline ventilation and oxygenation should also be considered. 5. Healthy sleep recommendations include: adequate nightly sleep (normal 7-9 hrs/night), avoidance of caffeine after  noon and alcohol near bedtime, and maintaining a sleep environment that is cool, dark and quiet. 6. Weight loss for overweight patients is recommended. Even modest amounts of weight loss can significantly  improve the severity of sleep apnea. 7. Snoring recommendations include: weight loss where appropriate, side sleeping, and avoidance of alcohol before  bed. 8. Operation of motor vehicle should not be performed when sleepy.  Signature: Armanda Magic, MD; [facility]; Diplomat, American Board of Sleep  Medicine Electronically Signed: 03/20/2023 12:29:05 PM

## 2023-03-25 IMAGING — CR DG CHEST 2V
2 series · 2 of 2 positions shown · non-contrast
Comparison: September 10, 2021

CLINICAL DATA: Recent COVID with cough.

EXAM:
CHEST - 2 VIEW

[chest pa]
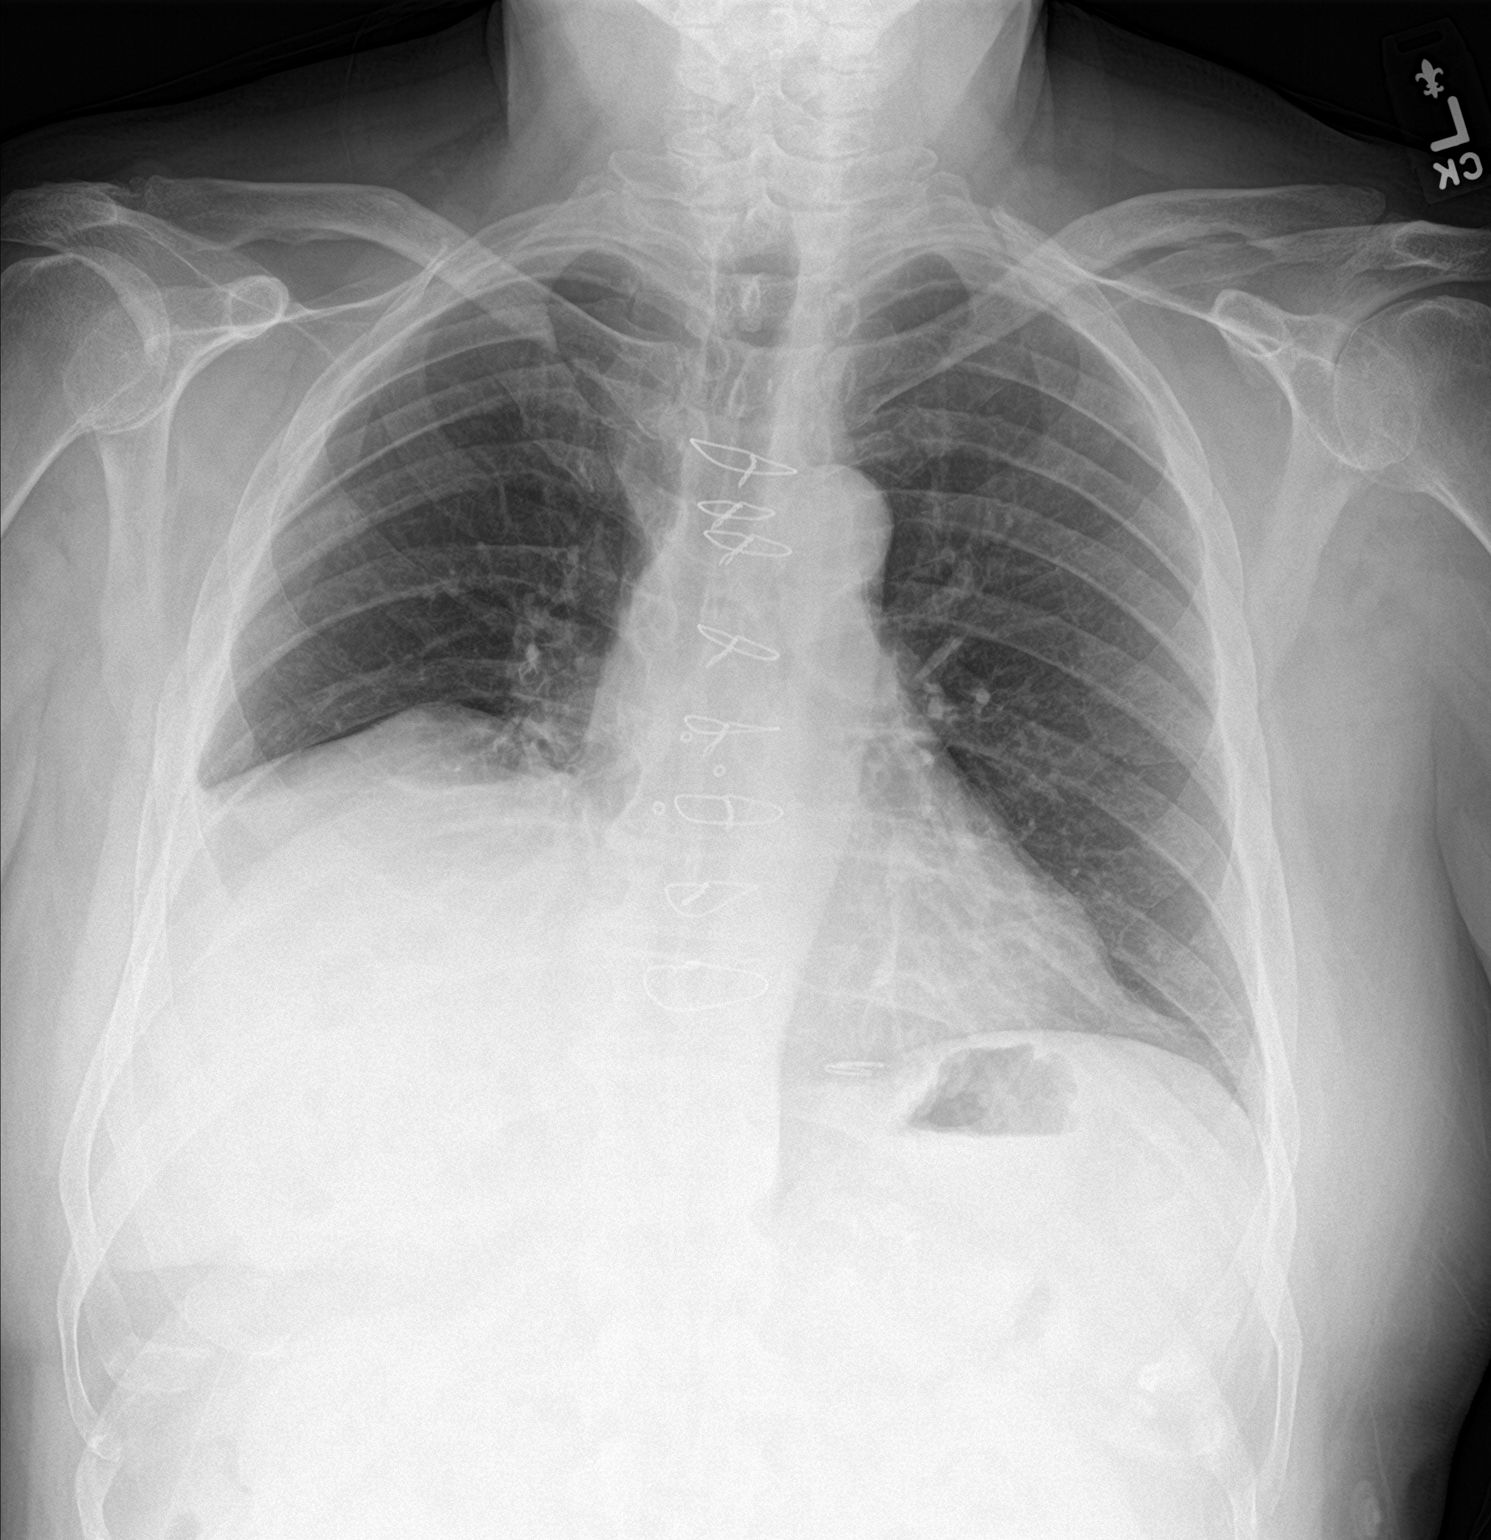

[chest lat]
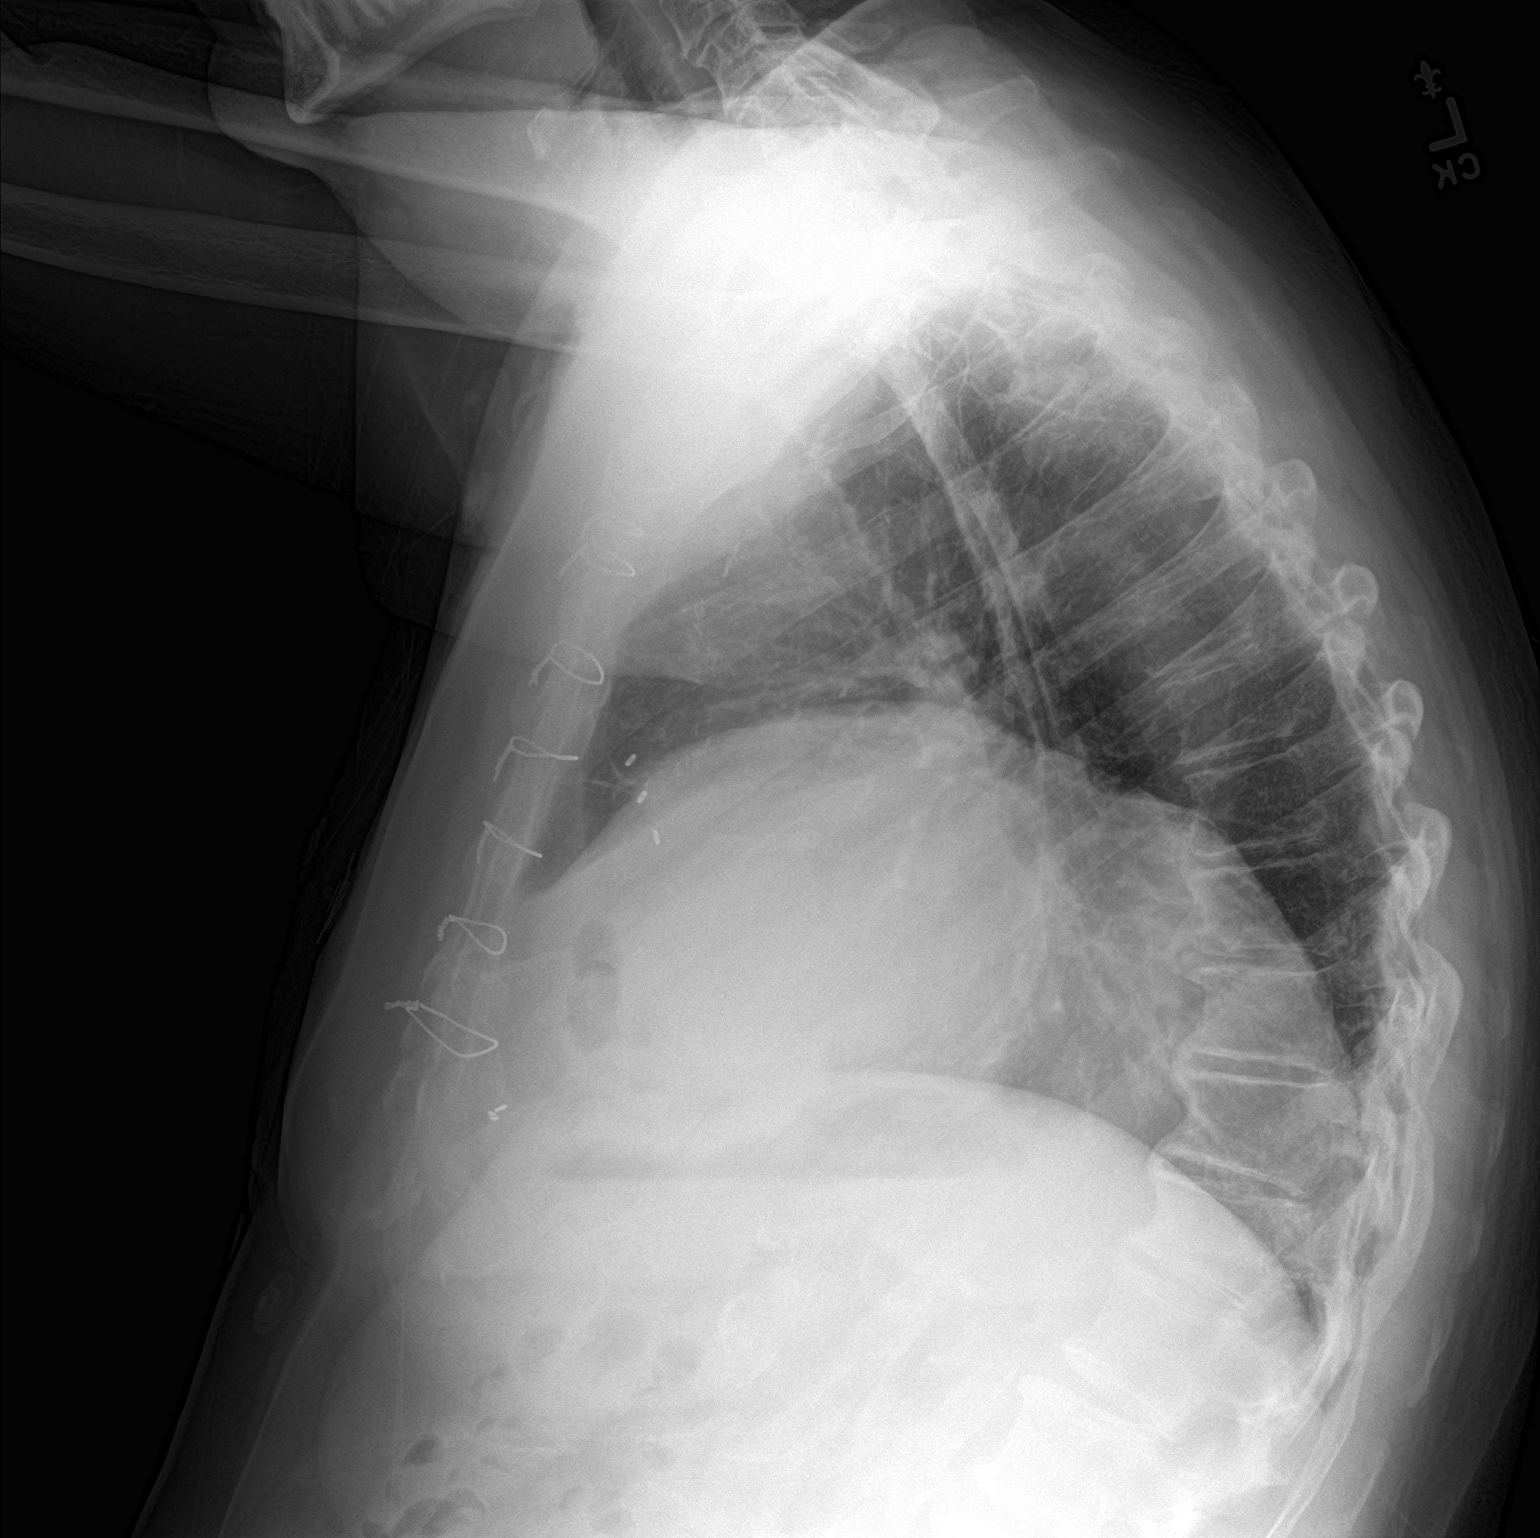

[2 of 2 positions shown; findings below may reference images not displayed]

FINDINGS: Multiple sternal wires and vascular clips are seen. There is stable
elevation of the right hemidiaphragm with a trace amount of
suspected right basilar atelectasis. There is no evidence of a
pleural effusion or pneumothorax. The heart size and mediastinal
contours are within normal limits. Multilevel degenerative changes
seen throughout the thoracic spine.
IMPRESSION: Stable chronic and postoperative changes without acute or active
cardiopulmonary disease.

## 2023-05-01 ENCOUNTER — Telehealth: Payer: Self-pay

## 2023-05-01 NOTE — Telephone Encounter (Signed)
-----   Message from Armanda Magic sent at 03/20/2023 12:30 PM EDT ----- Please let patient know that they have sleep apnea.  Recommend therapeutic CPAP titration for treatment of patient's sleep disordered breathing.  If unable to perform an in lab titration then initiate ResMed auto CPAP from 4 to 15cm H2O with heated humidity and mask of choice and overnight pulse ox on CPAP.

## 2023-05-01 NOTE — Telephone Encounter (Signed)
Left VM, per DPR, of sleep study results. Left callback number for sleep study recommendations and questions.

## 2023-05-05 ENCOUNTER — Telehealth: Payer: Self-pay

## 2023-05-05 DIAGNOSIS — G4733 Obstructive sleep apnea (adult) (pediatric): Secondary | ICD-10-CM

## 2023-05-05 NOTE — Telephone Encounter (Signed)
Patient returned call,notified patient of sleep study results and recommendations. All qustoins were answered, patient verbalize understanding. CPAP Titration ordered today 05/05/23.

## 2023-05-31 DIAGNOSIS — G4733 Obstructive sleep apnea (adult) (pediatric): Secondary | ICD-10-CM | POA: Insufficient documentation

## 2023-05-31 NOTE — Patient Instructions (Signed)
 Be Involved in Caring For Your Health:  Taking Medications When medications are taken as directed, they can greatly improve your health. But if they are not taken as prescribed, they may not work. In some cases, not taking them correctly can be harmful. To help ensure your treatment remains effective and safe, understand your medications and how to take them. Bring your medications to each visit for review by your provider.  Your lab results, notes, and after visit summary will be available on My Chart. We strongly encourage you to use this feature. If lab results are abnormal the clinic will contact you with the appropriate steps. If the clinic does not contact you assume the results are satisfactory. You can always view your results on My Chart. If you have questions regarding your health or results, please contact the clinic during office hours. You can also ask questions on My Chart.  We at [facility] are grateful that you chose [place] to provide your care. We strive to provide evidence-based and compassionate care and are always looking for feedback. If you get a survey from the clinic please complete this so we can hear your opinions.  Diabetes Mellitus and Nutrition, Adult When you have diabetes, or diabetes mellitus, it is very important to have healthy eating habits because your blood sugar (glucose) levels are greatly affected by what you eat and drink. Eating healthy foods in the right amounts, at about the same times every day, can help you: Manage your blood glucose. Lower your risk of heart disease. Improve your blood pressure. Reach or maintain a healthy weight. What can affect my meal plan? Every person with diabetes is different, and each person has different needs for a meal plan. Your health care provider may recommend that you work with a dietitian to make a meal plan that is best for you. Your meal plan may vary depending on factors such as: The calories you need. The  medicines you take. Your weight. Your blood glucose, blood pressure, and cholesterol levels. Your activity level. Other health conditions you have, such as heart or kidney disease. How do carbohydrates affect me? Carbohydrates, also called carbs, affect your blood glucose level more than any other type of food. Eating carbs raises the amount of glucose in your blood. It is important to know how many carbs you can safely have in each meal. This is different for every person. Your dietitian can help you calculate how many carbs you should have at each meal and for each snack. How does alcohol affect me? Alcohol can cause a decrease in blood glucose (hypoglycemia), especially if you use insulin or take certain diabetes medicines by mouth. Hypoglycemia can be a life-threatening condition. Symptoms of hypoglycemia, such as sleepiness, dizziness, and confusion, are similar to symptoms of having too much alcohol. Do not drink alcohol if: Your health care provider tells you not to drink. You are pregnant, may be pregnant, or are planning to become pregnant. If you drink alcohol: Limit how much you have to: 0-1 drink a day for women. 0-2 drinks a day for men. Know how much alcohol is in your drink. In the [place] one drink equals one 12 oz bottle of beer (355 mL), one 5 oz glass of wine (148 mL), or one 1 oz glass of hard liquor (44 mL). Keep yourself hydrated with water, diet soda, or unsweetened iced tea. Keep in mind that regular soda, juice, and other mixers may contain a lot of sugar and must  be counted as carbs. What are tips for following this plan?  Reading food labels Start by checking the serving size on the Nutrition Facts label of packaged foods and drinks. The number of calories and the amount of carbs, fats, and other nutrients listed on the label are based on one serving of the item. Many items contain more than one serving per package. Check the total grams (g) of carbs in one  serving. Check the number of grams of saturated fats and trans fats in one serving. Choose foods that have a low amount or none of these fats. Check the number of milligrams (mg) of salt (sodium) in one serving. Most people should limit total sodium intake to less than 2,300 mg per day. Always check the nutrition information of foods labeled as "low-fat" or "nonfat." These foods may be higher in added sugar or refined carbs and should be avoided. Talk to your dietitian to identify your daily goals for nutrients listed on the label. Shopping Avoid buying canned, pre-made, or processed foods. These foods tend to be high in fat, sodium, and added sugar. Shop around the outside edge of the grocery store. This is where you will most often find fresh fruits and vegetables, bulk grains, fresh meats, and fresh dairy products. Cooking Use low-heat cooking methods, such as baking, instead of high-heat cooking methods, such as deep frying. Cook using healthy oils, such as olive, canola, or sunflower oil. Avoid cooking with butter, cream, or high-fat meats. Meal planning Eat meals and snacks regularly, preferably at the same times every day. Avoid going long periods of time without eating. Eat foods that are high in fiber, such as fresh fruits, vegetables, beans, and whole grains. Eat 4-6 oz (112-168 g) of lean protein each day, such as lean meat, chicken, fish, eggs, or tofu. One ounce (oz) (28 g) of lean protein is equal to: 1 oz (28 g) of meat, chicken, or fish. 1 egg.  cup (62 g) of tofu. Eat some foods each day that contain healthy fats, such as avocado, nuts, seeds, and fish. What foods should I eat? Fruits Berries. Apples. Oranges. Peaches. Apricots. Plums. Grapes. Mangoes. Papayas. Pomegranates. Kiwi. Cherries. Vegetables Leafy greens, including lettuce, spinach, kale, chard, collard greens, mustard greens, and cabbage. Beets. Cauliflower. Broccoli. Carrots. Green beans. Tomatoes. Peppers.  Onions. Cucumbers. Brussels sprouts. Grains Whole grains, such as whole-wheat or whole-grain bread, crackers, tortillas, cereal, and pasta. Unsweetened oatmeal. Quinoa. Brown or wild rice. Meats and other proteins Seafood. Poultry without skin. Lean cuts of poultry and beef. Tofu. Nuts. Seeds. Dairy Low-fat or fat-free dairy products such as milk, yogurt, and cheese. The items listed above may not be a complete list of foods and beverages you can eat and drink. Contact a dietitian for more information. What foods should I avoid? Fruits Fruits canned with syrup. Vegetables Canned vegetables. Frozen vegetables with butter or cream sauce. Grains Refined white flour and flour products such as bread, pasta, snack foods, and cereals. Avoid all processed foods. Meats and other proteins Fatty cuts of meat. Poultry with skin. Breaded or fried meats. Processed meat. Avoid saturated fats. Dairy Full-fat yogurt, cheese, or milk. Beverages Sweetened drinks, such as soda or iced tea. The items listed above may not be a complete list of foods and beverages you should avoid. Contact a dietitian for more information. Questions to ask a health care provider Do I need to meet with a certified diabetes care and education specialist? Do I need to meet with a  dietitian? What number can I call if I have questions? When are the best times to check my blood glucose? Where to find more information: American Diabetes Association: diabetes.org Academy of Nutrition and Dietetics: eatright.[employer] of Diabetes and Digestive and Kidney Diseases: StageSync.si Association of Diabetes Care & Education Specialists: diabeteseducator.org Summary It is important to have healthy eating habits because your blood sugar (glucose) levels are greatly affected by what you eat and drink. It is important to use alcohol carefully. A healthy meal plan will help you manage your blood glucose and lower your risk of  heart disease. Your health care provider may recommend that you work with a dietitian to make a meal plan that is best for you. This information is not intended to replace advice given to you by your health care provider. Make sure you discuss any questions you have with your health care provider. Document Revised: 03/21/2020 Document Reviewed: 03/21/2020 Elsevier Patient Education  2024 [employer].

## 2023-06-01 ENCOUNTER — Encounter: Payer: Self-pay | Admitting: Internal Medicine

## 2023-06-01 ENCOUNTER — Encounter: Payer: Self-pay | Admitting: Nurse Practitioner

## 2023-06-01 ENCOUNTER — Encounter: Payer: Self-pay | Admitting: Dermatology

## 2023-06-01 ENCOUNTER — Ambulatory Visit: Payer: Medicare Other | Admitting: Nurse Practitioner

## 2023-06-01 VITALS — BP 136/74 | HR 61 | Temp 98.1°F | Ht 68.0 in | Wt 194.8 lb

## 2023-06-01 DIAGNOSIS — E6609 Other obesity due to excess calories: Secondary | ICD-10-CM

## 2023-06-01 DIAGNOSIS — E1159 Type 2 diabetes mellitus with other circulatory complications: Secondary | ICD-10-CM

## 2023-06-01 DIAGNOSIS — N319 Neuromuscular dysfunction of bladder, unspecified: Secondary | ICD-10-CM

## 2023-06-01 DIAGNOSIS — D649 Anemia, unspecified: Secondary | ICD-10-CM

## 2023-06-01 DIAGNOSIS — E1129 Type 2 diabetes mellitus with other diabetic kidney complication: Secondary | ICD-10-CM

## 2023-06-01 DIAGNOSIS — Z23 Encounter for immunization: Secondary | ICD-10-CM

## 2023-06-01 DIAGNOSIS — E1169 Type 2 diabetes mellitus with other specified complication: Secondary | ICD-10-CM | POA: Diagnosis not present

## 2023-06-01 DIAGNOSIS — I340 Nonrheumatic mitral (valve) insufficiency: Secondary | ICD-10-CM

## 2023-06-01 DIAGNOSIS — I2510 Atherosclerotic heart disease of native coronary artery without angina pectoris: Secondary | ICD-10-CM

## 2023-06-01 DIAGNOSIS — I152 Hypertension secondary to endocrine disorders: Secondary | ICD-10-CM

## 2023-06-01 DIAGNOSIS — G4733 Obstructive sleep apnea (adult) (pediatric): Secondary | ICD-10-CM | POA: Diagnosis not present

## 2023-06-01 DIAGNOSIS — D692 Other nonthrombocytopenic purpura: Secondary | ICD-10-CM | POA: Insufficient documentation

## 2023-06-01 DIAGNOSIS — R809 Proteinuria, unspecified: Secondary | ICD-10-CM

## 2023-06-01 NOTE — Assessment & Plan Note (Signed)
Chronic, stable with no recent chest pain.  Continue collaboration with cardiology and current medication regimen as ordered by them.   

## 2023-06-01 NOTE — Progress Notes (Signed)
BP 136/74 (BP Location: Left Arm, Patient Position: Sitting)   Pulse 61   Temp 98.1 F (36.7 C) (Oral)   Ht 5\' 8"  (1.727 m)   Wt 194 lb 12.8 oz (88.4 kg)   SpO2 97%   BMI 29.62 kg/m    Subjective:    Patient ID: Kyle Richards, male    DOB: [birth year], 77 y.o.   MRN: [id number]  HPI: Kyle Richards is a 77 y.o. male  Chief Complaint  Patient presents with   Diabetes   Hyperlipidemia   Hypertension   Benign Prostatic Hypertrophy   DIABETES A1c on 02/05/23 was 6.8%.  Taking Metformin 500 MG TID.  Continues B12 for history of low levels.  [facility]   Hypoglycemic episodes:no Polydipsia/polyuria: no Visual disturbance: no Chest pain: no Paresthesias: no Glucose Monitoring: no  Accucheck frequency: Not Checking  Fasting glucose:  Post prandial:  Evening:  Before meals: Taking Insulin?: no  Long acting insulin:  Short acting insulin: Blood Pressure Monitoring: not checking Retinal Examination: Up to Date Foot Exam: Up to Date Pneumovax: Up to Date Influenza: Refused Aspirin: yes   HYPERTENSION / HYPERLIPIDEMIA Taking Atorvastatin, Lisinopril, and Metoprolol.  Follows with cardiology with last visit on 02/27/23 -- had reassuring testing.  Underlying mitral regurgitation present.  Diagnosed with OSA moderate level in August 2024, has not received any equipment yet he reports.  Had recent labs in ER in June due to syncope and H/H mildly low.  He denies any symptoms. Satisfied with current treatment? yes Duration of hypertension: chronic BP monitoring frequency: not checking BP range:  BP medication side effects: no Duration of hyperlipidemia: chronic Cholesterol medication side effects: no Cholesterol supplements: none Medication compliance: good compliance Aspirin: yes Recent stressors: no Recurrent headaches: no Visual changes: no Palpitations: no Dyspnea: no Chest pain: no Lower extremity edema: no Dizzy/lightheaded: no  The  ASCVD Risk score (Arnett DK, et al., 2019) failed to calculate for the following reasons:   The valid total cholesterol range is 130 to 320 mg/dL  BPH Followed by urology with last visit on 10/28/22, can return in one year. Continues on Flomax and Finasteride.  Neurogenic bladder and self caths at home every 4-6 hours. BPH status: stable Satisfied with current treatment?: yes Medication side effects: no Medication compliance: good compliance Duration: chronic Nocturia: no Urinary frequency:no Incomplete voiding: no Urgency: no Weak urinary stream: no Straining to start stream: no Dysuria: no Onset: gradual Severity: mild  Relevant past medical, surgical, family and social history reviewed and updated as indicated. Interim medical history since our last visit reviewed. Allergies and medications reviewed and updated.  Review of Systems  Constitutional:  Negative for activity change, diaphoresis, fatigue and fever.  Respiratory:  Negative for cough, chest tightness, shortness of breath and wheezing.   Cardiovascular:  Negative for chest pain, palpitations and leg swelling.  Gastrointestinal: Negative.   Endocrine: Negative for polydipsia, polyphagia and polyuria.  Neurological: Negative.   Psychiatric/Behavioral: Negative.     Per HPI unless specifically indicated above     Objective:    BP 136/74 (BP Location: Left Arm, Patient Position: Sitting)   Pulse 61   Temp 98.1 F (36.7 C) (Oral)   Ht 5\' 8"  (1.727 m)   Wt 194 lb 12.8 oz (88.4 kg)   SpO2 97%   BMI 29.62 kg/m   Wt Readings from Last 3 Encounters:  06/01/23 194 lb 12.8 oz (88.4 kg)  02/27/23 192 lb 3.2  oz (87.2 kg)  11/28/22 193 lb (87.5 kg)    Physical Exam Vitals and nursing note reviewed.  Constitutional:      General: He is awake. He is not in acute distress.    Appearance: He is well-developed and well-groomed. He is obese. He is not ill-appearing or toxic-appearing.  HENT:     Head: Normocephalic and  atraumatic.     Right Ear: Hearing and external ear normal. No drainage.     Left Ear: Hearing and external ear normal. No drainage.  Eyes:     General: Lids are normal.        Right eye: No discharge.        Left eye: No discharge.     Conjunctiva/sclera: Conjunctivae normal.     Pupils: Pupils are equal, round, and reactive to light.  Neck:     Thyroid: No thyromegaly.     Vascular: No carotid bruit.     Trachea: Trachea normal.  Cardiovascular:     Rate and Rhythm: Normal rate and regular rhythm.     Heart sounds: S1 normal and S2 normal. Murmur heard.     Systolic murmur is present with a grade of 2/6.     No gallop.  Pulmonary:     Effort: Pulmonary effort is normal. No accessory muscle usage or respiratory distress.     Breath sounds: Normal breath sounds.  Abdominal:     General: Bowel sounds are normal.     Palpations: Abdomen is soft.  Musculoskeletal:        General: Normal range of motion.     Cervical back: Normal range of motion and neck supple.     Right lower leg: No edema.     Left lower leg: No edema.  Lymphadenopathy:     Cervical: No cervical adenopathy.  Skin:    General: Skin is warm and dry.     Capillary Refill: Capillary refill takes less than 2 seconds.     Findings: Bruising present.     Comments: Scattered bruising bilateral upper extremities.  Neurological:     Mental Status: He is alert and oriented to person, place, and time.     Deep Tendon Reflexes: Reflexes are normal and symmetric.     Reflex Scores:      Brachioradialis reflexes are 2+ on the right side and 2+ on the left side.      Patellar reflexes are 2+ on the right side and 2+ on the left side. Psychiatric:        Attention and Perception: Attention normal.        Mood and Affect: Mood normal.        Speech: Speech normal.        Behavior: Behavior normal. Behavior is cooperative.        Thought Content: Thought content normal.    Results for orders placed or performed in visit  on 11/28/22  Bayer DCA Hb A1c Waived  Result Value Ref Range   HB A1C (BAYER DCA - WAIVED) 7.1 (H) 4.8 - 5.6 %  Microalbumin, Urine Waived  Result Value Ref Range   Microalb, Ur Waived 30 (H) 0 - 19 mg/L   Creatinine, Urine Waived 100 10 - 300 mg/dL   Microalb/Creat Ratio <30 <30 mg/g  Comprehensive metabolic panel  Result Value Ref Range   Glucose 148 (H) 70 - 99 mg/dL   BUN 21 8 - 27 mg/dL   Creatinine, Ser 1.66 0.76 - 1.27 mg/dL  eGFR 66 >59 mL/min/1.73   BUN/Creatinine Ratio 18 10 - 24   Sodium 140 134 - 144 mmol/L   Potassium 4.5 3.5 - 5.2 mmol/L   Chloride 104 96 - 106 mmol/L   CO2 22 20 - 29 mmol/L   Calcium 9.2 8.6 - 10.2 mg/dL   Total Protein 6.7 6.0 - 8.5 g/dL   Albumin 4.5 3.8 - 4.8 g/dL   Globulin, Total 2.2 1.5 - 4.5 g/dL   Albumin/Globulin Ratio 2.0 1.2 - 2.2   Bilirubin Total 0.4 0.0 - 1.2 mg/dL   Alkaline Phosphatase 82 44 - 121 IU/L   AST 12 0 - 40 IU/L   ALT 16 0 - 44 IU/L  Lipid Panel w/o Chol/HDL Ratio  Result Value Ref Range   Cholesterol, Total 129 100 - 199 mg/dL   Triglycerides 79 0 - 149 mg/dL   HDL 46 >40 mg/dL   VLDL Cholesterol Cal 16 5 - 40 mg/dL   LDL Chol Calc (NIH) 67 0 - 99 mg/dL  Vitamin J81  Result Value Ref Range   Vitamin B-12 820 232 - 1,245 pg/mL      Assessment & Plan:   Problem List Items Addressed This Visit       Cardiovascular and Mediastinum   Coronary artery disease involving native coronary artery of native heart without angina pectoris    Chronic, stable with no recent chest pain.  Continue collaboration with cardiology and current medication regimen as ordered by them.        Hypertension associated with diabetes (HCC)    Chronic, stable.  BP at goal on recheck in office today. Recommend he monitor BP at least a few mornings a week at home and document.  DASH diet at home.  Continue current medication regimen and adjust as needed + collaboration with cardiology.  Labs today: CMP, CBC, A1c.  Urine ALB 29 November 2022.   Return in 6 months.      Relevant Orders   TSH   HgB A1c   Nonrheumatic mitral valve regurgitation    Ongoing.  Noted on cardiology notes, continue collaboration with cardiology and current medication regimen ordered by them.      Senile purpura (HCC)    Noted on exam to bilateral upper extremities.  Recommend use of gentle soap daily and monitor for wounds, alert provider if these present.        Respiratory   OSA on CPAP - Primary    Diagnosed 05/01/23, he has not received equipment yet, recommend he reach out to cardiology for update on this.        Endocrine   Hyperlipidemia associated with type 2 diabetes mellitus (HCC)    Chronic, ongoing.  Continue statin therapy daily and adjust as needed.  Recheck lipid panel today.      Relevant Orders   Comprehensive metabolic panel   Lipid Panel w/o Chol/HDL Ratio   HgB A1c   Type 2 diabetes mellitus with proteinuria (HCC)    Chronic, stable. Urine ALB 30 (March 2024) and A1c 7.1% last visit, trending down - will recheck today. Continue Metformin 500 MG TID, but recommend he [facility] increase in Metformin to 1000 MG BID or add on of GLP1.  He obtains all medications from the [place].  Continue Lisinopril for kidney protection.  Recommend he check BS 2-3 times daily and document for provider visits.  Focus on diet changes.  Return to office in 6 months, has PCP at [place] he  sees between visits with local PCP.      Relevant Orders   HgB A1c     Other   Neurogenic bladder    Chronic, stable.  Self caths at home, monitor for infections.  Continue collaboration with urology and current medication regimen as prescribed by them.  Recent note reviewed.      Obesity    BMI 29.62.  Recommended eating smaller high protein, low fat meals more frequently and exercising 30 mins a day 5 times a week with a goal of 10-15lb weight loss in the next 3 months. Patient voiced their understanding and motivation to adhere to these  recommendations.       Other Visit Diagnoses     Low hemoglobin       Recheck levels today.   Relevant Orders   CBC with Differential/Platelet   Iron Binding Cap (TIBC)([facility])   Ferritin        Follow up plan: Return in about 6 months (around 11/29/2023) for T2DM, HTN/HLD, BPH, CAD.

## 2023-06-01 NOTE — Assessment & Plan Note (Signed)
Chronic, ongoing.  Continue supplement and adjust as needed.  Labs today.

## 2023-06-01 NOTE — Assessment & Plan Note (Signed)
Chronic, stable. Urine ALB 30 (March 2024) and A1c 7.1% last visit, trending down - will recheck today. Continue Metformin 500 MG TID, but recommend he discuss with [facility] increase in Metformin to 1000 MG BID or add on of GLP1.  He obtains all medications from the [place].  Continue Lisinopril for kidney protection.  Recommend he check BS 2-3 times daily and document for provider visits.  Focus on diet changes.  Return to office in 6 months, has PCP at [place] he sees between visits with local PCP.

## 2023-06-01 NOTE — Assessment & Plan Note (Signed)
Chronic, ongoing.  Continue statin therapy daily and adjust as needed.  Recheck lipid panel today.

## 2023-06-01 NOTE — Assessment & Plan Note (Signed)
Chronic, stable.  Self caths at home, monitor for infections.  Continue collaboration with urology and current medication regimen as prescribed by them.  Recent note reviewed.

## 2023-06-01 NOTE — Assessment & Plan Note (Signed)
Noted on exam to bilateral upper extremities.  Recommend use of gentle soap daily and monitor for wounds, alert provider if these present.

## 2023-06-01 NOTE — Assessment & Plan Note (Signed)
Diagnosed 05/01/23, he has not received equipment yet, recommend he reach out to cardiology for update on this.

## 2023-06-01 NOTE — Assessment & Plan Note (Signed)
Ongoing.  Noted on cardiology notes, continue collaboration with cardiology and current medication regimen ordered by them.

## 2023-06-01 NOTE — Assessment & Plan Note (Signed)
Chronic, ongoing.  Continue collaboration with urology and current medication regimen as prescribed by them.  Recent note reviewed. 

## 2023-06-01 NOTE — Assessment & Plan Note (Signed)
BMI 29.62.  Recommended eating smaller high protein, low fat meals more frequently and exercising 30 mins a day 5 times a week with a goal of 10-15lb weight loss in the next 3 months. Patient voiced their understanding and motivation to adhere to these recommendations. ? ?

## 2023-06-01 NOTE — Assessment & Plan Note (Signed)
Chronic, stable.  BP at goal on recheck in office today. Recommend he monitor BP at least a few mornings a week at home and document.  DASH diet at home.  Continue current medication regimen and adjust as needed + collaboration with cardiology.  Labs today: CMP, CBC, A1c.  Urine ALB 29 November 2022.  Return in 6 months.

## 2023-06-02 LAB — CBC WITH DIFFERENTIAL/PLATELET
Basophils Absolute: 0 10*3/uL (ref 0.0–0.2)
Basos: 1 %
EOS (ABSOLUTE): 0.1 10*3/uL (ref 0.0–0.4)
Eos: 2 %
Hematocrit: 45.6 % (ref 37.5–51.0)
Hemoglobin: 14.1 g/dL (ref 13.0–17.7)
Immature Grans (Abs): 0 10*3/uL (ref 0.0–0.1)
Immature Granulocytes: 0 %
Lymphocytes Absolute: 1.5 10*3/uL (ref 0.7–3.1)
Lymphs: 26 %
MCH: 28.7 pg (ref 26.6–33.0)
MCHC: 30.9 g/dL — ABNORMAL LOW (ref 31.5–35.7)
MCV: 93 fL (ref 79–97)
Monocytes Absolute: 0.5 10*3/uL (ref 0.1–0.9)
Monocytes: 8 %
Neutrophils Absolute: 3.6 10*3/uL (ref 1.4–7.0)
Neutrophils: 63 %
Platelets: 250 10*3/uL (ref 150–450)
RBC: 4.92 x10E6/uL (ref 4.14–5.80)
RDW: 13.4 % (ref 11.6–15.4)
WBC: 5.6 10*3/uL (ref 3.4–10.8)

## 2023-06-02 LAB — COMPREHENSIVE METABOLIC PANEL
ALT: 15 [IU]/L (ref 0–44)
AST: 14 [IU]/L (ref 0–40)
Albumin: 4.4 g/dL (ref 3.8–4.8)
Alkaline Phosphatase: 65 [IU]/L (ref 44–121)
BUN/Creatinine Ratio: 16 (ref 10–24)
BUN: 17 mg/dL (ref 8–27)
Bilirubin Total: 0.5 mg/dL (ref 0.0–1.2)
CO2: 24 mmol/L (ref 20–29)
Calcium: 9.2 mg/dL (ref 8.6–10.2)
Chloride: 103 mmol/L (ref 96–106)
Creatinine, Ser: 1.05 mg/dL (ref 0.76–1.27)
Globulin, Total: 1.9 g/dL (ref 1.5–4.5)
Glucose: 140 mg/dL — ABNORMAL HIGH (ref 70–99)
Potassium: 4.8 mmol/L (ref 3.5–5.2)
Sodium: 140 mmol/L (ref 134–144)
Total Protein: 6.3 g/dL (ref 6.0–8.5)
eGFR: 73 mL/min/{1.73_m2} (ref >59)

## 2023-06-02 LAB — LIPID PANEL W/O CHOL/HDL RATIO
Cholesterol, Total: 113 mg/dL (ref 100–199)
HDL: 48 mg/dL (ref >39)
LDL Chol Calc (NIH): 54 mg/dL (ref 0–99)
Triglycerides: 47 mg/dL (ref 0–149)
VLDL Cholesterol Cal: 11 mg/dL (ref 5–40)

## 2023-06-02 LAB — TSH: TSH: 1.04 u[IU]/mL (ref 0.450–4.500)

## 2023-06-02 LAB — HEMOGLOBIN A1C
Est. average glucose Bld gHb Est-mCnc: 163 mg/dL
Hgb A1c MFr Bld: 7.3 % — ABNORMAL HIGH (ref 4.8–5.6)

## 2023-06-02 LAB — IRON AND TIBC
Iron Saturation: 18 % (ref 15–55)
Iron: 47 ug/dL (ref 38–169)
Total Iron Binding Capacity: 268 ug/dL (ref 250–450)
UIBC: 221 ug/dL (ref 111–343)

## 2023-06-02 LAB — FERRITIN: Ferritin: 57 ng/mL (ref 30–400)

## 2023-06-02 NOTE — Progress Notes (Signed)
Contacted via MyChart   Good afternoon Kyle Richards, your labs have returned: - A1c is 7.3%, slight trend up from previous of 6.8%.  I recommend you discuss with [facility] adding on something like Ozempic or Trulicity to help better control sugars and can een assist with weight loss.   - Kidney function, creatinine and eGFR, remains normal, as is liver function, AST and ALT.  - Remainder of labs all stable.  Any questions? Keep being awesome!!  Thank you for allowing me to participate in your care.  I appreciate you. Kindest regards, Odin Mariani

## 2023-06-03 ENCOUNTER — Ambulatory Visit: Payer: No Typology Code available for payment source | Admitting: Dermatology

## 2023-06-18 NOTE — Telephone Encounter (Signed)
Reached out to patient to see if he wanted to let the [place] do his titration as it would be free to him and would cost several thousand dollars through [facility]. Patient states he wanted to schedule the test and then he would submit it to the [place] after because he is fully disabled and they will pick up the cost for him. Test has been submitted.

## 2023-07-16 ENCOUNTER — Encounter (HOSPITAL_BASED_OUTPATIENT_CLINIC_OR_DEPARTMENT_OTHER): Payer: No Typology Code available for payment source | Admitting: Cardiology

## 2023-07-17 ENCOUNTER — Ambulatory Visit: Payer: Medicare Other | Admitting: Physician Assistant

## 2023-07-24 ENCOUNTER — Ambulatory Visit: Payer: Medicare Other | Admitting: Physician Assistant

## 2023-07-26 ENCOUNTER — Emergency Department
Admission: EM | Admit: 2023-07-26 | Discharge: 2023-07-26 | Disposition: A | Discharge status: Home / Self Care | Payer: No Typology Code available for payment source | Attending: Emergency Medicine | Admitting: Emergency Medicine

## 2023-07-26 ENCOUNTER — Other Ambulatory Visit: Payer: Self-pay

## 2023-07-26 DIAGNOSIS — Z7984 Long term (current) use of oral hypoglycemic drugs: Secondary | ICD-10-CM | POA: Diagnosis not present

## 2023-07-26 DIAGNOSIS — I2510 Atherosclerotic heart disease of native coronary artery without angina pectoris: Secondary | ICD-10-CM | POA: Insufficient documentation

## 2023-07-26 DIAGNOSIS — E119 Type 2 diabetes mellitus without complications: Secondary | ICD-10-CM | POA: Insufficient documentation

## 2023-07-26 DIAGNOSIS — Z7982 Long term (current) use of aspirin: Secondary | ICD-10-CM | POA: Diagnosis not present

## 2023-07-26 DIAGNOSIS — E1140 Type 2 diabetes mellitus with diabetic neuropathy, unspecified: Secondary | ICD-10-CM | POA: Diagnosis not present

## 2023-07-26 DIAGNOSIS — N390 Urinary tract infection, site not specified: Secondary | ICD-10-CM | POA: Insufficient documentation

## 2023-07-26 DIAGNOSIS — R300 Dysuria: Secondary | ICD-10-CM | POA: Diagnosis present

## 2023-07-26 DIAGNOSIS — B9689 Other specified bacterial agents as the cause of diseases classified elsewhere: Secondary | ICD-10-CM | POA: Diagnosis not present

## 2023-07-26 DIAGNOSIS — Z79899 Other long term (current) drug therapy: Secondary | ICD-10-CM | POA: Insufficient documentation

## 2023-07-26 LAB — URINALYSIS, ROUTINE W REFLEX MICROSCOPIC
Bilirubin Urine: NEGATIVE
Glucose, UA: NEGATIVE mg/dL
Ketones, ur: NEGATIVE mg/dL
Nitrite: NEGATIVE
Protein, ur: 100 mg/dL — AB
RBC / HPF: >50 RBC/hpf (ref 0–5)
Specific Gravity, Urine: 1.018 (ref 1.005–1.030)
Squamous Epithelial / HPF: 0 /[HPF] (ref 0–5)
WBC, UA: >50 WBC/hpf (ref 0–5)
pH: 7 (ref 5.0–8.0)

## 2023-07-26 MED ORDER — CIPROFLOXACIN HCL 500 MG PO TABS
500.0000 mg | ORAL_TABLET | Freq: Once | ORAL | Status: AC
  Administered 2023-07-26: 500 mg via ORAL
  Filled 2023-07-26: qty 1

## 2023-07-26 MED ORDER — CIPROFLOXACIN HCL 500 MG PO TABS: 500.0000 mg | ORAL_TABLET | Freq: Two times a day (BID) | ORAL | 0 refills | Status: AC

## 2023-07-26 NOTE — ED Triage Notes (Signed)
Pt reports head of his penis has been burning when he self caths. States he has neurogenic bladder. Typically pt will urinate a little and then will complete the void with a catheter. Denies urine smelling foul or burning with urination. States burning is specifically with catheter insertion. States had made an appointment for urology for this week, but that they cancelled and moved it. Pt ambulatory to triage. Alert and oriented. Breathing unlabored.

## 2023-07-26 NOTE — ED Provider Notes (Signed)
[facility] Provider Note    Event Date/Time   First MD Initiated Contact with Patient 07/26/23 [phone]     (approximate)   History   Penis Pain   HPI  Kyle Richards is a 77 y.o. male with history of CAD, hypertension, diabetes, hyperlipidemia, neurogenic bladder who has to self cath who presents to the emergency department with dysuria.  States discomfort with performing catheterization and when urine comes out of his penis.  No discoloration or swelling of the penis.  No testicular pain or swelling.  No fevers.  No flank pain or abdominal pain.  No vomiting.   History provided by patient.    Past Medical History:  Diagnosis Date   Diabetes (HCC)    Heart attack (HCC) 2012   High cholesterol    Hypertension    Neurogenic bladder disorder    Neuropathy     Past Surgical History:  Procedure Laterality Date   FINGER FRACTURE SURGERY Left    middle finger left hand   OTHER SURGICAL HISTORY     Heart Bypass   TONSILLECTOMY AND ADENOIDECTOMY  1953    MEDICATIONS:  Prior to Admission medications   Medication Sig Start Date End Date Taking? Authorizing Provider  aspirin 81 MG chewable tablet Chew 81 mg by mouth daily.    [provider]  atorvastatin (LIPITOR) 40 MG tablet Take 40 mg by mouth daily.    [provider]  cyanocobalamin (VITAMIN B12) 500 MCG tablet Take 2 tablets by mouth daily. 07/15/22   [provider]  finasteride (PROSCAR) 5 MG tablet Take 5 mg by mouth daily. 06/03/21   [provider]  lisinopril (ZESTRIL) 20 MG tablet Take 20 mg by mouth daily. 01/10/22   [provider]  metFORMIN (GLUCOPHAGE) 500 MG tablet Take 500 mg by mouth 3 (three) times daily. 03/20/20   [provider]  metoprolol succinate (TOPROL XL) 25 MG 24 hr tablet Take 1 tablet (25 mg total) by mouth daily. 03/22/21   Meriam Sprague, MD  tamsulosin (FLOMAX) 0.4 MG CAPS capsule Take 1 capsule (0.4 mg  total) by mouth daily. 06/14/18   Michiel Cowboy A, PA-C  VITAMIN D PO Take 1,000 Units by mouth daily.    [provider]    Physical Exam   Triage Vital Signs: ED Triage Vitals  Encounter Vitals Group     BP 07/26/23 0308 134/84     Systolic BP Percentile --      Diastolic BP Percentile --      Pulse Rate 07/26/23 0308 87     Resp 07/26/23 0308 18     Temp 07/26/23 0308 97.6 F (36.4 C)     Temp Source 07/26/23 0308 Oral     SpO2 07/26/23 0308 93 %     Weight 07/26/23 0307 190 lb (86.2 kg)     Height 07/26/23 0307 5\' 8"  (1.727 m)     Head Circumference --      Peak Flow --      Pain Score --      Pain Loc --      Pain Education --      Exclude from Growth Chart --     Most recent vital signs: Vitals:   07/26/23 0308  BP: 134/84  Pulse: 87  Resp: 18  Temp: 97.6 F (36.4 C)  SpO2: 93%    CONSTITUTIONAL: Alert, responds appropriately to questions. Well-appearing; well-nourished HEAD: Normocephalic, atraumatic EYES: Conjunctivae  clear, pupils appear equal, sclera nonicteric ENT: normal nose; moist mucous membranes NECK: Supple, normal ROM CARD: RRR; S1 and S2 appreciated RESP: Normal chest excursion without splinting or tachypnea; breath sounds clear and equal bilaterally; no wheezes, no rhonchi, no rales, no hypoxia or respiratory distress, speaking full sentences ABD/GI: Non-distended; soft, non-tender, no rebound, no guarding, no peritoneal signs BACK: The back appears normal EXT: Normal ROM in all joints; no deformity noted, no edema SKIN: Normal color for age and race; warm; no rash on exposed skin NEURO: Moves all extremities equally, normal speech PSYCH: The patient's mood and manner are appropriate.   ED Results / Procedures / Treatments   LABS: (all labs ordered are listed, but only abnormal results are displayed) Labs Reviewed  URINALYSIS, ROUTINE W REFLEX MICROSCOPIC - Abnormal; Notable for the following components:      Result Value    Color, Urine YELLOW (*)    APPearance CLOUDY (*)    Hgb urine dipstick LARGE (*)    Protein, ur 100 (*)    Leukocytes,Ua LARGE (*)    Bacteria, UA RARE (*)    All other components within normal limits  URINE CULTURE     EKG:  EKG Interpretation Date/Time:    Ventricular Rate:    PR Interval:    QRS Duration:    QT Interval:    QTC Calculation:   R Axis:      Text Interpretation:           RADIOLOGY: My personal review and interpretation of imaging:    I have personally reviewed all radiology reports.   No results found.   PROCEDURES:  Critical Care performed: No      Procedures    IMPRESSION / MDM / ASSESSMENT AND PLAN / ED COURSE  I reviewed the triage vital signs and the nursing notes.    Patient has symptoms of UTI.  Reports this feels similar to previous UTIs.     DIFFERENTIAL DIAGNOSIS (includes but not limited to):   UTI, less likely pyelonephritis, kidney stone, doubt appendicitis, testicular torsion, epididymitis   Patient's presentation is most consistent with acute complicated illness / injury requiring diagnostic workup.   PLAN: Urinalysis appears infected.  Culture pending.  Previous cultures have grown E. coli, Staph epidermidis and Citrobacter.  All of these have been sensitive to Cipro before.  Will discharge with Cipro for the next week and have him follow-up with his PCP.  He has urology routine visit already scheduled on December 13.   MEDICATIONS GIVEN IN ED: Medications  ciprofloxacin (CIPRO) tablet 500 mg (500 mg Oral Given 07/26/23 0411)     ED COURSE:  At this time, I do not feel there is any life-threatening condition present. I reviewed all nursing notes, vitals, pertinent previous records.  All lab and urine results, EKGs, imaging ordered have been independently reviewed and interpreted by myself.  I reviewed all available radiology reports from any imaging ordered this visit.  Based on my assessment, I feel the patient  is safe to be discharged home without further emergent workup and can continue workup as an outpatient as needed. Discussed all findings, treatment plan as well as usual and customary return precautions.  They verbalize understanding and are comfortable with this plan.  Outpatient follow-up has been provided as needed.  All questions have been answered.    CONSULTS:  none   OUTSIDE RECORDS REVIEWED: Reviewed last PCP note in October 2024.       FINAL  CLINICAL IMPRESSION(S) / ED DIAGNOSES   Final diagnoses:  Acute UTI     Rx / DC Orders   ED Discharge Orders          Ordered    ciprofloxacin (CIPRO) 500 MG tablet  2 times daily        07/26/23 0408             Note:  This document was prepared using Dragon voice recognition software and may include unintentional dictation errors.   Cung Masterson, Layla Maw, DO 07/26/23 [phone]

## 2023-07-28 LAB — URINE CULTURE: Culture: >=100000 — AB

## 2023-08-08 NOTE — Progress Notes (Unsigned)
Cardiology Office Note:    Date:  08/10/2023   ID:  Kyle Richards, DOB May 10, 1946, MRN [id number]  PCP:  Marjie Skiff, NP    Follow up of CAD   History of Present Illness:    Kyle Richards is a 77 y.o. male with a hx of CAD (s/p CABG in 2011), HTN (labile), mitral regurgitation, HLD, T2DM  -June  2024 the pt was sitting in chair  fell asleep /became unconscoius   Son couldn't wake him   EMS called    Pt hospitalized   at [facility]  Review of records  No arrhythmias noted on tele.  Trop flat  Echo showed normal LVEF   MIld MR   Urine reported + for benzodiazepines, which he had not been prescribed     Sent home on 02/06/23 ith monitor    I saw the pt in June 2024  He says he mailed in Monitor to group in [place]   Told he would be called if abnormal   he did not receive call  Since I saw him last he has had no further spells like above  No dizziness  No syncope   No palpitations  He denies CP   Breathing is OK  BP at home    120s/      [facility]    Walks up steps daily   Walks on job  Job stresses him    Past Medical History:  Diagnosis Date   Diabetes (HCC)    Heart attack (HCC) 2012   High cholesterol    Hypertension    Neurogenic bladder disorder    Neuropathy     Past Surgical History:  Procedure Laterality Date   FINGER FRACTURE SURGERY Left    middle finger left hand   OTHER SURGICAL HISTORY     Heart Bypass   TONSILLECTOMY AND ADENOIDECTOMY  1953    Current Medications: Current Meds  Medication Sig   aspirin 81 MG chewable tablet Chew 81 mg by mouth daily.   atorvastatin (LIPITOR) 40 MG tablet Take 40 mg by mouth daily.   cyanocobalamin (VITAMIN B12) 500 MCG tablet Take 2 tablets by mouth daily.   finasteride (PROSCAR) 5 MG tablet Take 5 mg by mouth daily.   lisinopril (ZESTRIL) 20 MG tablet Take 20 mg by mouth daily.   metFORMIN (GLUCOPHAGE) 500 MG tablet Take 500 mg by mouth 3 (three) times daily.   metoprolol  succinate (TOPROL XL) 25 MG 24 hr tablet Take 1 tablet (25 mg total) by mouth daily.   tamsulosin (FLOMAX) 0.4 MG CAPS capsule Take 1 capsule (0.4 mg total) by mouth daily.   VITAMIN D PO Take 1,000 Units by mouth daily.     Allergies:   Fentanyl   Social History   Socioeconomic History   Marital status: Single    Spouse name: Not on file   Number of children: 2   Years of education: Not on file   Highest education level: Doctorate  Occupational History   Not on file  Tobacco Use   Smoking status: Never    Passive exposure: Never   Smokeless tobacco: Never  Vaping Use   Vaping status: Never Used  Substance and Sexual Activity   Alcohol use: Yes    Comment: 1 beer monthly   Drug use: Never   Sexual activity: Not Currently  Other Topics Concern   Not on file  Social History Narrative  Not on file   Social Determinants of Health   Financial Resource Strain: Low Risk  (11/28/2022)   Overall Financial Resource Strain (CARDIA)    Difficulty of Paying Living Expenses: Not hard at all  Food Insecurity: No Food Insecurity (11/28/2022)   Hunger Vital Sign    Worried About Running Out of Food in the Last Year: Never true    Ran Out of Food in the Last Year: Never true  Transportation Needs: No Transportation Needs (11/28/2022)   PRAPARE - Administrator, Civil Service (Medical): No    Lack of Transportation (Non-Medical): No  Recent Concern: Transportation Needs - Unmet Transportation Needs (11/24/2022)   PRAPARE - Administrator, Civil Service (Medical): Yes    Lack of Transportation (Non-Medical): No  Physical Activity: Insufficiently Active (11/28/2022)   Exercise Vital Sign    Days of Exercise per Week: 2 days    Minutes of Exercise per Session: 20 min  Stress: No Stress Concern Present (11/28/2022)   [employer] of Occupational Health - Occupational Stress Questionnaire    Feeling of Stress : Only a little  Social Connections: Socially  Isolated (11/28/2022)   Social Connection and Isolation Panel [NHANES]    Frequency of Communication with Friends and Family: Three times a week    Frequency of Social Gatherings with Friends and Family: Three times a week    Attends Religious Services: Never    Active Member of Clubs or Organizations: No    Attends Banker Meetings: Never    Marital Status: Divorced     Family History: The patient's He was adopted. Family history is unknown by patient.  ROS:   Review of Systems  Constitutional:  Negative for fever and malaise/fatigue.  HENT:  Negative for congestion and ear pain.   Respiratory:  Negative for cough, shortness of breath and wheezing.   Cardiovascular:  Negative for chest pain, palpitations, orthopnea, claudication, leg swelling and PND.  Gastrointestinal:  Negative for diarrhea and nausea.  Musculoskeletal:  Positive for myalgias.  Neurological:  Negative for loss of consciousness.     EKGs/Labs/Other Studies Reviewed:    TTE  2023   Conclusions        1: The left ventricular cavity size is normal.                      2: Left ventricular ejection fraction is normal, estimated at 55-60%.                        3: No regional wall motion abnormalities.                        4: There is normal diastolic function.                      5: Right ventricle is normal in size and systolic function.                      6: Left atrium is mildly dilated.                      7: Aortic valve sclerosis without significant stenosis or regurgitation.                      8: Mild mitral annular calcifications without significant stenosis. Mild mitral  regurgitation.                      9: Estimated PASP of 26 mmHg.                      10: Compared to the study on 12/12/20; MR was previously noted to be moderate. Estimated PASP has                        improved from prior 40-45 mmHg.   EKG:   No new ECG today   Recent Labs: 06/01/2023: ALT 15; BUN 17;  Creatinine, Ser 1.05; Hemoglobin 14.1; Platelets 250; Potassium 4.8; Sodium 140; TSH 1.040  Recent Lipid Panel    Component Value Date/Time   CHOL 113 06/01/2023 0900   TRIG 47 06/01/2023 0900   HDL 48 06/01/2023 0900   LDLCALC 54 06/01/2023 0900    STOP-Bang Score:  5       Physical Exam:    VS:  BP 138/82   Pulse 85   Ht 5\' 8"  (1.727 m)   Wt 190 lb 12.8 oz (86.5 kg)   SpO2 95%   BMI 29.01 kg/m     Wt Readings from Last 3 Encounters:  08/10/23 190 lb 12.8 oz (86.5 kg)  07/26/23 190 lb (86.2 kg)  06/01/23 194 lb 12.8 oz (88.4 kg)     GEN: Well nourished, well developed in no acute distress HEENT: Normal NECK: No JVD;  CARDIAC: RRR, no significant murmurs  RESPIRATORY:  Clear to auscultation  ABDOMEN: Soft, non-tender, non-distended  No masses  MUSCULOSKELETAL:  No edema;  ASSESSMENT:     1  Syncope   Work up negative (Done in [place])  No further spells  No dizziensss   Follow   2  CAD   CABG in 2011   PT relatively active   Denies symptoms   Follow    3  LIpids  LDL 54  HDL 48 Trig 47 in    Sept 3220  4  HTN BP better at home   Follow   5  DM  last A1C 7.1   Sept 2024   Pt says he was traveling at the time   Says glucose better now     Taking metformin regularly   Glu (finger prick) in 100s)   5  OSA  Pt to be set up with CPAP at the [place]  Follow up end of Aug 2025          #CAD s/p CABG on 2011: Doing well without anginal symptoms. Tolerating medications as prescribed. TTE  11/2020 with LVEF 55-60%, no WMA, G2DD, moderate MR. Repeat TTE in 01/2022 with LVEF 55-60%, mild MR. -Continue metop succinate 25mg  XL daily -Continue lisinopril 20mg  daily -Continue lipitor 20mg  daily -Continue ASA 81mg  daily  #Moderate MR>mild: Improved to mild based on TTE in 02/21/22. -Monitor with repeat echo in 2025  #HTN: Well controlled at home with recent increase in lisinopril. No orthostatic symptoms. -Continue metop succinate 25mg  XL daily -Continue  lisinopril 20mg  daily  #Episode of low blood pressure/suspected orthostasis: Presented to ER in 12/2020 with soft blood pressures. Orthostatics negative and work-up reassruing. No current symptoms.  -Resolved  #HLD: -Continue lipitor 20mg  daily -Labs monitored at [facility]; goal LDL<70  #DMII: -Continue metformin 500mg  BID  #Neurogenic Bladder: -Self catheterizes     Medication Adjustments/Labs and Tests Ordered: Current medicines are reviewed at length  with the patient today.  Concerns regarding medicines are outlined above.  No orders of the defined types were placed in this encounter.   No orders of the defined types were placed in this encounter.   There are no Patient Instructions on file for this visit.      I,Zite Okoli,acting as a Neurosurgeon for [employer], MD.,have documented all relevant documentation on the behalf of Dietrich Pates, MD,as directed by  Dietrich Pates, MD while in the presence of Dietrich Pates, MD.   I, Dietrich Pates, MD, have reviewed all documentation for this visit. The documentation on 08/10/23 for the exam, diagnosis, procedures, and orders are all accurate and complete.   Signed, Dietrich Pates, MD  08/10/2023 8:18 AM    [facility]

## 2023-08-10 ENCOUNTER — Ambulatory Visit: Payer: Medicare Other | Attending: Internal Medicine | Admitting: Internal Medicine

## 2023-08-10 ENCOUNTER — Encounter: Payer: Self-pay | Admitting: Internal Medicine

## 2023-08-10 VITALS — BP 138/82 | HR 85 | Ht 68.0 in | Wt 190.8 lb

## 2023-08-10 DIAGNOSIS — I2510 Atherosclerotic heart disease of native coronary artery without angina pectoris: Secondary | ICD-10-CM | POA: Diagnosis not present

## 2023-08-10 NOTE — Patient Instructions (Signed)
Medication Instructions:  Your physician recommends that you continue on your current medications as directed. Please refer to the Current Medication list given to you today.  *If you need a refill on your cardiac medications before your next appointment, please call your pharmacy*   Lab Work: None.  If you have labs (blood work) drawn today and your tests are completely normal, you will receive your results only by: MyChart Message (if you have MyChart) OR A paper copy in the mail If you have any lab test that is abnormal or we need to change your treatment, we will call you to review the results.   Testing/Procedures: None.   Follow-Up:  Your next appointment will be at the end of August 2025 with:     Provider:   Dietrich Pates, MD

## 2023-08-14 ENCOUNTER — Ambulatory Visit: Payer: Medicare Other | Admitting: Physician Assistant

## 2023-08-14 ENCOUNTER — Encounter: Payer: Self-pay | Admitting: Physician Assistant

## 2023-08-14 VITALS — BP 129/85 | HR 73

## 2023-08-14 DIAGNOSIS — Z87898 Personal history of other specified conditions: Secondary | ICD-10-CM | POA: Diagnosis not present

## 2023-08-14 DIAGNOSIS — R339 Retention of urine, unspecified: Secondary | ICD-10-CM | POA: Diagnosis not present

## 2023-08-14 DIAGNOSIS — R319 Hematuria, unspecified: Secondary | ICD-10-CM

## 2023-08-14 LAB — URINALYSIS, COMPLETE
Bilirubin, UA: NEGATIVE
Ketones, UA: NEGATIVE
Leukocytes,UA: NEGATIVE
Nitrite, UA: NEGATIVE
Protein,UA: NEGATIVE
RBC, UA: NEGATIVE
Specific Gravity, UA: 1.015 (ref 1.005–1.030)
Urobilinogen, Ur: 0.2 mg/dL (ref 0.2–1.0)
pH, UA: 6 (ref 5.0–7.5)

## 2023-08-14 LAB — MICROSCOPIC EXAMINATION: Epithelial Cells (non renal): >10 /[HPF] — AB (ref 0–10)

## 2023-08-14 NOTE — Progress Notes (Signed)
08/14/2023 4:15 PM   Kyle Richards Post [birth year] [id number]  CC: Chief Complaint  Patient presents with   Follow-up   Cystitis   HPI: Kyle Richards is a 77 y.o. male with PMH BPH with bladder emptying on Flomax and finasteride managed with [facility] and high risk hematuria with benign workup in 2021 who presents today for annual follow-up.   He was treated for UTI about 1 month ago in our emergency department.  He was prescribed Cipro 500 mg twice daily x 7 days and urine culture grew pansensitive Staph saprophyticus.  Today he reports he is feeling well with no acute concerns.  His metformin was recently increased and he would like to recheck his renal function especially in light of his recent UTI.  He occasionally notices foamy urine and wonders if he has proteinuria.  He has been performing self-catheterization 4-5 times daily and tolerating this well using a Coloplast flex coud pro 14 [place].  He has significant bleeding when he attempts catheterization with the flex coud standard catheters.  IPSS 7/mixed as below.  In-office UA today positive for 1+ glucose; urine microscopy with >10 epithelial cells/hpf.   IPSS     Row Name 08/14/23 1500         International Prostate Symptom Score   How often have you had the sensation of not emptying your bladder? Almost always     How often have you found you stopped and started again several times when you urinated? Not at All     How often have you found it difficult to postpone urination? Not at All     How often have you had a weak urinary stream? Not at All     How many times did you typically get up at night to urinate? 2 Times     Total IPSS Score 7       Quality of Life due to urinary symptoms   If you were to spend the rest of your life with your urinary condition just the way it is now how would you feel about that? Mixed               PMH: Past Medical History:  Diagnosis Date   Diabetes (HCC)    Heart  attack (HCC) 2012   High cholesterol    Hypertension    Neurogenic bladder disorder    Neuropathy     Surgical History: Past Surgical History:  Procedure Laterality Date   FINGER FRACTURE SURGERY Left    middle finger left hand   OTHER SURGICAL HISTORY     Heart Bypass   TONSILLECTOMY AND ADENOIDECTOMY  1953    Home Medications:  Allergies as of 08/14/2023       Reactions   Fentanyl Other (See Comments)   "Stopped breathing"        Medication List        Accurate as of August 14, 2023  4:15 PM. If you have any questions, ask your nurse or doctor.          aspirin 81 MG chewable tablet Chew 81 mg by mouth daily.   atorvastatin 40 MG tablet Commonly known as: LIPITOR Take 40 mg by mouth daily.   cyanocobalamin 500 MCG tablet Commonly known as: VITAMIN B12 Take 2 tablets by mouth daily.   finasteride 5 MG tablet Commonly known as: PROSCAR Take 5 mg by mouth daily.   lisinopril 20 MG tablet Commonly known as: ZESTRIL Take 20 mg  by mouth daily.   metFORMIN 500 MG tablet Commonly known as: GLUCOPHAGE Take 500 mg by mouth 3 (three) times daily.   metoprolol succinate 25 MG 24 hr tablet Commonly known as: Toprol XL Take 1 tablet (25 mg total) by mouth daily.   tamsulosin 0.4 MG Caps capsule Commonly known as: FLOMAX Take 1 capsule (0.4 mg total) by mouth daily.   VITAMIN D PO Take 1,000 Units by mouth daily.        Allergies:  Allergies  Allergen Reactions   Fentanyl Other (See Comments)    "Stopped breathing"    Family History: Family History  Adopted: Yes  Family history unknown: Yes    Social History:   reports that he has never smoked. He has never been exposed to tobacco smoke. He has never used smokeless tobacco. He reports current alcohol use. He reports that he does not use drugs.  Physical Exam: BP 129/85   Pulse 73   Constitutional:  Alert and oriented, no acute distress, nontoxic appearing HEENT: [place],  AT Cardiovascular: No clubbing, cyanosis, or edema Respiratory: Normal respiratory effort, no increased work of breathing Skin: No rashes, bruises or suspicious lesions Neurologic: Grossly intact, no focal deficits, moving all 4 extremities Psychiatric: Normal mood and affect  Laboratory Data: Results for orders placed or performed in visit on 08/14/23  Microscopic Examination   Collection Time: 08/14/23  3:24 PM   Urine  Result Value Ref Range   WBC, UA 0-5 0 - 5 /hpf   RBC, Urine 0-2 0 - 2 /hpf   Epithelial Cells (non renal) >10 (A) 0 - 10 /hpf   Bacteria, UA Few None seen/Few  Urinalysis, Complete   Collection Time: 08/14/23  3:24 PM  Result Value Ref Range   Specific Gravity, UA 1.015 1.005 - 1.030   pH, UA 6.0 5.0 - 7.5   Color, UA Yellow Yellow   Appearance Ur Clear Clear   Leukocytes,UA Negative Negative   Protein,UA Negative Negative/Trace   Glucose, UA 1+ (A) Negative   Ketones, UA Negative Negative   RBC, UA Negative Negative   Bilirubin, UA Negative Negative   Urobilinogen, Ur 0.2 0.2 - 1.0 mg/dL   Nitrite, UA Negative Negative   Microscopic Examination See below:    Assessment & Plan:   1. Incomplete bladder emptying (Primary) Well-managed with [facility] 4-5 times daily.  He will continue this.  Due to BPH, he is unable to pass a straight catheter to the level of his bladder and requires a coud tip.  Will check a BMP today per patient request. - Basic metabolic panel  2. Hematuria, unspecified type Resolved on UA today.  UA is bland.  I reassured him that there is no protein in his urine today, nothing to do about the foamy urine. - Urinalysis, Complete   Return for 1 year follow-up IPSS, PVR, UA.  Carman Ching, PA-C  [facility] [place] [address], [address] [place], [place] [address] [phone]

## 2023-08-15 ENCOUNTER — Encounter: Payer: Self-pay | Admitting: Internal Medicine

## 2023-08-15 LAB — BASIC METABOLIC PANEL
BUN/Creatinine Ratio: 14 (ref 10–24)
BUN: 20 mg/dL (ref 8–27)
CO2: 23 mmol/L (ref 20–29)
Calcium: 9.7 mg/dL (ref 8.6–10.2)
Chloride: 103 mmol/L (ref 96–106)
Creatinine, Ser: 1.42 mg/dL — ABNORMAL HIGH (ref 0.76–1.27)
Glucose: 143 mg/dL — ABNORMAL HIGH (ref 70–99)
Potassium: 4.8 mmol/L (ref 3.5–5.2)
Sodium: 140 mmol/L (ref 134–144)
eGFR: 51 mL/min/{1.73_m2} — ABNORMAL LOW (ref >59)

## 2023-08-19 ENCOUNTER — Encounter: Payer: Self-pay | Admitting: Nurse Practitioner

## 2023-08-19 NOTE — Telephone Encounter (Signed)
Called patient left detailed message for patient to call back and schedule lab appt next week.

## 2023-08-21 ENCOUNTER — Telehealth: Payer: Self-pay

## 2023-08-21 NOTE — Telephone Encounter (Signed)
[facility] and [facility] He does not need anything from [facility] that is sleep related at the moment. All questions were answered and patient verbalized understanding.

## 2023-08-27 ENCOUNTER — Other Ambulatory Visit: Payer: Self-pay | Admitting: Nurse Practitioner

## 2023-08-27 ENCOUNTER — Other Ambulatory Visit: Payer: Medicare Other

## 2023-08-27 DIAGNOSIS — R809 Proteinuria, unspecified: Secondary | ICD-10-CM

## 2023-08-27 NOTE — Progress Notes (Unsigned)
ord

## 2023-08-28 LAB — CBC WITH DIFFERENTIAL/PLATELET
Basophils Absolute: 0 10*3/uL (ref 0.0–0.2)
Basos: 1 %
EOS (ABSOLUTE): 0.1 10*3/uL (ref 0.0–0.4)
Eos: 1 %
Hematocrit: 43.3 % (ref 37.5–51.0)
Hemoglobin: 14 g/dL (ref 13.0–17.7)
Immature Grans (Abs): 0 10*3/uL (ref 0.0–0.1)
Immature Granulocytes: 0 %
Lymphocytes Absolute: 1.8 10*3/uL (ref 0.7–3.1)
Lymphs: 28 %
MCH: 29.4 pg (ref 26.6–33.0)
MCHC: 32.3 g/dL (ref 31.5–35.7)
MCV: 91 fL (ref 79–97)
Monocytes Absolute: 0.4 10*3/uL (ref 0.1–0.9)
Monocytes: 6 %
Neutrophils Absolute: 4.1 10*3/uL (ref 1.4–7.0)
Neutrophils: 64 %
Platelets: 236 10*3/uL (ref 150–450)
RBC: 4.76 x10E6/uL (ref 4.14–5.80)
RDW: 13 % (ref 11.6–15.4)
WBC: 6.4 10*3/uL (ref 3.4–10.8)

## 2023-08-28 LAB — COMPREHENSIVE METABOLIC PANEL
ALT: 18 [IU]/L (ref 0–44)
AST: 14 [IU]/L (ref 0–40)
Albumin: 4.3 g/dL (ref 3.8–4.8)
Alkaline Phosphatase: 65 [IU]/L (ref 44–121)
BUN/Creatinine Ratio: 16 (ref 10–24)
BUN: 20 mg/dL (ref 8–27)
Bilirubin Total: 0.3 mg/dL (ref 0.0–1.2)
CO2: 20 mmol/L (ref 20–29)
Calcium: 9.5 mg/dL (ref 8.6–10.2)
Chloride: 102 mmol/L (ref 96–106)
Creatinine, Ser: 1.22 mg/dL (ref 0.76–1.27)
Globulin, Total: 1.9 g/dL (ref 1.5–4.5)
Glucose: 169 mg/dL — ABNORMAL HIGH (ref 70–99)
Potassium: 4.9 mmol/L (ref 3.5–5.2)
Sodium: 138 mmol/L (ref 134–144)
Total Protein: 6.2 g/dL (ref 6.0–8.5)
eGFR: 61 mL/min/{1.73_m2} (ref >59)

## 2023-08-28 NOTE — Progress Notes (Signed)
Contacted via MyChart   Good afternoon Kyle Richards, kidney function back to normal.  Continue all medications:)

## 2023-11-22 DIAGNOSIS — E119 Type 2 diabetes mellitus without complications: Secondary | ICD-10-CM | POA: Insufficient documentation

## 2023-11-22 NOTE — Patient Instructions (Signed)

## 2023-11-26 ENCOUNTER — Encounter: Payer: Self-pay | Admitting: Nurse Practitioner

## 2023-11-26 ENCOUNTER — Ambulatory Visit: Admitting: Nurse Practitioner

## 2023-11-26 VITALS — BP 115/68 | HR 79 | Temp 97.7°F | Ht 68.0 in | Wt 192.8 lb

## 2023-11-26 DIAGNOSIS — E119 Type 2 diabetes mellitus without complications: Secondary | ICD-10-CM

## 2023-11-26 DIAGNOSIS — E1129 Type 2 diabetes mellitus with other diabetic kidney complication: Secondary | ICD-10-CM | POA: Diagnosis not present

## 2023-11-26 DIAGNOSIS — R809 Proteinuria, unspecified: Secondary | ICD-10-CM

## 2023-11-26 DIAGNOSIS — N319 Neuromuscular dysfunction of bladder, unspecified: Secondary | ICD-10-CM

## 2023-11-26 DIAGNOSIS — E1169 Type 2 diabetes mellitus with other specified complication: Secondary | ICD-10-CM | POA: Diagnosis not present

## 2023-11-26 DIAGNOSIS — E66811 Obesity, class 1: Secondary | ICD-10-CM

## 2023-11-26 DIAGNOSIS — E1159 Type 2 diabetes mellitus with other circulatory complications: Secondary | ICD-10-CM | POA: Diagnosis not present

## 2023-11-26 DIAGNOSIS — E6609 Other obesity due to excess calories: Secondary | ICD-10-CM

## 2023-11-26 DIAGNOSIS — D692 Other nonthrombocytopenic purpura: Secondary | ICD-10-CM

## 2023-11-26 DIAGNOSIS — Z7984 Long term (current) use of oral hypoglycemic drugs: Secondary | ICD-10-CM

## 2023-11-26 DIAGNOSIS — I152 Hypertension secondary to endocrine disorders: Secondary | ICD-10-CM

## 2023-11-26 DIAGNOSIS — G4733 Obstructive sleep apnea (adult) (pediatric): Secondary | ICD-10-CM

## 2023-11-26 DIAGNOSIS — I2510 Atherosclerotic heart disease of native coronary artery without angina pectoris: Secondary | ICD-10-CM

## 2023-11-26 LAB — MICROALBUMIN, URINE WAIVED
Creatinine, Urine Waived: 100 mg/dL (ref 10–300)
Microalb, Ur Waived: 30 mg/L — ABNORMAL HIGH (ref 0–19)
Microalb/Creat Ratio: <30 mg/g (ref <30)

## 2023-11-26 LAB — BAYER DCA HB A1C WAIVED: HB A1C (BAYER DCA - WAIVED): 7.1 % — ABNORMAL HIGH (ref 4.8–5.6)

## 2023-11-26 NOTE — Assessment & Plan Note (Signed)
Chronic, stable.  Self caths at home, monitor for infections.  Continue collaboration with urology and current medication regimen as prescribed by them.  Recent note reviewed.

## 2023-11-26 NOTE — Assessment & Plan Note (Signed)
 Refer to diabetes with proteinuria.

## 2023-11-26 NOTE — Assessment & Plan Note (Signed)
Chronic, ongoing.  Continue statin therapy daily and adjust as needed.  Recheck lipid panel today.

## 2023-11-26 NOTE — Assessment & Plan Note (Signed)
 Chronic, stable.  BP at goal in office and at home. Recommend he monitor BP at least a few mornings a week at home and document.  DASH diet at home.  Continue current medication regimen and adjust as needed + collaboration with cardiology.  Labs today: CMP, A1c.  Urine ALB 29 November 2023.  Return in 6 months.

## 2023-11-26 NOTE — Progress Notes (Signed)
 BP 115/68   Pulse 79   Temp 97.7 F (36.5 C) (Oral)   Ht 5\' 8"  (1.727 m)   Wt 192 lb 12.8 oz (87.5 kg)   SpO2 97%   BMI 29.32 kg/m    Subjective:    Patient ID: Kyle Richards, male    DOB: [birth year], 78 y.o.   MRN: [id number]  HPI: Kyle Richards is a 78 y.o. male  Chief Complaint  Patient presents with   Benign Prostatic Hypertrophy   Diabetes   Hyperlipidemia   Hypertension   DIABETES Last A1c 7.3% September.  Taking Metformin 500 MG TID. Takes B12 for low levels in past.  Sees PCP at [facility], next visit end of April. Hypoglycemic episodes:no Polydipsia/polyuria: no Visual disturbance: no Chest pain: no Paresthesias: no Glucose Monitoring: no  Accucheck frequency: Not Checking  Fasting glucose:  Post prandial:  Evening:  Before meals: Taking Insulin?: no  Long acting insulin:  Short acting insulin: Blood Pressure Monitoring: not checking Retinal Examination: Up to Date -- every 6 months at the [place] Foot Exam: Up to Date Pneumovax: Up to Date Influenza: Refused Aspirin: yes   HYPERTENSION / HYPERLIPIDEMIA Continues Atorvastatin, Lisinopril, and Metoprolol.  Last saw cardiology on 02/27/23. Mitral regurgitation present.  Diagnosed with OSA in August 2024, is using CPAP machine.  Over the last week has stopped using as feels there is no difference. Satisfied with current treatment? yes Duration of hypertension: chronic BP monitoring frequency:daily BP range: <130/80 on average BP medication side effects: no Duration of hyperlipidemia: chronic Cholesterol medication side effects: no Cholesterol supplements: none Medication compliance: good compliance Aspirin: yes Recent stressors: no Recurrent headaches: no Visual changes: no Palpitations: no Dyspnea: no Chest pain: no Lower extremity edema: no Dizzy/lightheaded: no  The ASCVD Risk score (Arnett DK, et al., 2019) failed to calculate for the following reasons:   The valid total cholesterol range  is 130 to 320 mg/dL  BPH Last saw urology 40/98/11. Takes Flomax and Finasteride.  Neurogenic bladder present and self caths at home every 4-6 hours. BPH status: stable Satisfied with current treatment?: yes Medication side effects: no Medication compliance: good compliance Duration: chronic Nocturia: no Urinary frequency:no Incomplete voiding: no Urgency: no Weak urinary stream: no Straining to start stream: no Dysuria: no Onset: gradual Severity: mild  Relevant past medical, surgical, family and social history reviewed and updated as indicated. Interim medical history since our last visit reviewed. Allergies and medications reviewed and updated.  Review of Systems  Constitutional:  Negative for activity change, diaphoresis, fatigue and fever.  Respiratory:  Negative for cough, chest tightness, shortness of breath and wheezing.   Cardiovascular:  Negative for chest pain, palpitations and leg swelling.  Gastrointestinal: Negative.   Endocrine: Negative for polydipsia, polyphagia and polyuria.  Neurological: Negative.   Psychiatric/Behavioral: Negative.     Per HPI unless specifically indicated above     Objective:    BP 115/68   Pulse 79   Temp 97.7 F (36.5 C) (Oral)   Ht 5\' 8"  (1.727 m)   Wt 192 lb 12.8 oz (87.5 kg)   SpO2 97%   BMI 29.32 kg/m   Wt Readings from Last 3 Encounters:  11/26/23 192 lb 12.8 oz (87.5 kg)  08/10/23 190 lb 12.8 oz (86.5 kg)  07/26/23 190 lb (86.2 kg)    Physical Exam Vitals and nursing note reviewed.  Constitutional:      General: He is awake. He is not in acute distress.  Appearance: He is well-developed and well-groomed. He is obese. He is not ill-appearing or toxic-appearing.  HENT:     Head: Normocephalic and atraumatic.     Right Ear: Hearing and external ear normal. No drainage.     Left Ear: Hearing and external ear normal. No drainage.  Eyes:     General: Lids are normal.        Right eye: No discharge.        Left  eye: No discharge.     Conjunctiva/sclera: Conjunctivae normal.     Pupils: Pupils are equal, round, and reactive to light.  Neck:     Thyroid: No thyromegaly.     Vascular: No carotid bruit.     Trachea: Trachea normal.  Cardiovascular:     Rate and Rhythm: Normal rate and regular rhythm.     Heart sounds: S1 normal and S2 normal. Murmur heard.     Systolic murmur is present with a grade of 2/6.     No gallop.  Pulmonary:     Effort: Pulmonary effort is normal. No accessory muscle usage or respiratory distress.     Breath sounds: Normal breath sounds.  Abdominal:     General: Bowel sounds are normal.     Palpations: Abdomen is soft.  Musculoskeletal:        General: Normal range of motion.     Cervical back: Normal range of motion and neck supple.     Right lower leg: No edema.     Left lower leg: No edema.  Lymphadenopathy:     Cervical: No cervical adenopathy.  Skin:    General: Skin is warm and dry.     Capillary Refill: Capillary refill takes less than 2 seconds.     Findings: Bruising present.     Comments: Scattered bruising bilateral upper extremities.  Neurological:     Mental Status: He is alert and oriented to person, place, and time.     Deep Tendon Reflexes: Reflexes are normal and symmetric.     Reflex Scores:      Brachioradialis reflexes are 2+ on the right side and 2+ on the left side.      Patellar reflexes are 2+ on the right side and 2+ on the left side. Psychiatric:        Attention and Perception: Attention normal.        Mood and Affect: Mood normal.        Speech: Speech normal.        Behavior: Behavior normal. Behavior is cooperative.        Thought Content: Thought content normal.    Diabetic Foot Exam - Simple   Simple Foot Form Visual Inspection Sensation Testing Intact to touch and monofilament testing bilaterally: Yes Pulse Check Posterior Tibialis and Dorsalis pulse intact bilaterally: Yes Comments Thick toenails     Results for  orders placed or performed in visit on 11/26/23  Bayer DCA Hb A1c Waived   Collection Time: 11/26/23 11:02 AM  Result Value Ref Range   HB A1C (BAYER DCA - WAIVED) 7.1 (H) 4.8 - 5.6 %  Microalbumin, Urine Waived   Collection Time: 11/26/23 11:02 AM  Result Value Ref Range   Microalb, Ur Waived 30 (H) 0 - 19 mg/L   Creatinine, Urine Waived 100 10 - 300 mg/dL   Microalb/Creat Ratio <30 <30 mg/g      Assessment & Plan:   Problem List Items Addressed This Visit  Cardiovascular and Mediastinum   Coronary artery disease involving native coronary artery of native heart without angina pectoris   Chronic, stable with no recent chest pain.  Continue collaboration with cardiology and current medication regimen as ordered by them.        Hypertension associated with diabetes (HCC)   Chronic, stable.  BP at goal in office and at home. Recommend he monitor BP at least a few mornings a week at home and document.  DASH diet at home.  Continue current medication regimen and adjust as needed + collaboration with cardiology.  Labs today: CMP, A1c.  Urine ALB 29 November 2023.  Return in 6 months.      Relevant Orders   Bayer DCA Hb A1c Waived (Completed)   Comprehensive metabolic panel   Senile purpura (HCC)   Noted on exam to bilateral upper extremities.  Recommend use of gentle soap daily and monitor for wounds, alert provider if these present.        Respiratory   OSA on CPAP     Endocrine   Diabetes mellitus treated with oral medication (HCC)   Refer to diabetes with proteinuria.      Relevant Orders   Bayer DCA Hb A1c Waived (Completed)   Diabetes mellitus type 2 without retinopathy (HCC)   Chronic, stable. Urine ALB 30 (March 2025) and A1c 7.1% today. Continue Metformin 500 MG TID, but [facility] in [facility] to 1000 MG BID or add on of GLP1. Obtains all medications from the [place]. Continue Lisinopril for kidney protection.  Recommend he check BS 2-3  times daily and document for provider visits.  Focus on diet changes.  Return to office in 6 months, has PCP at [place] he sees between visits with local PCP. - Eye exams at the [facility]      Relevant Orders   Bayer DCA Hb A1c Waived (Completed)   Hyperlipidemia associated with type 2 diabetes mellitus (HCC)   Chronic, ongoing.  Continue statin therapy daily and adjust as needed.  Recheck lipid panel today.      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 Urine ALB 30 (March 2025) and A1c 7.1% today. Continue Metformin 500 MG TID, but recommend he [facility] increase in Metformin to 1000 MG BID or add on of GLP1. Obtains all medications from the [place]. Continue Lisinopril for kidney protection.  Recommend he check BS 2-3 times daily and document for provider visits.  Focus on diet changes.  Return to office in 6 months, has PCP at [place] he sees between visits with local PCP.      Relevant Orders   Bayer DCA Hb A1c Waived (Completed)   Microalbumin, Urine Waived (Completed)     Other   Neurogenic bladder   Chronic, stable.  Self caths at home, monitor for infections.  Continue collaboration with urology and current medication regimen as prescribed by them.  Recent note reviewed.      Obesity   BMI 29.32.  Recommended eating smaller high protein, low fat meals more frequently and exercising 30 mins a day 5 times a week with a goal of 10-15lb weight loss in the next 3 months. Patient voiced their understanding and motivation to adhere to these recommendations.         Follow  up plan: Return in about 6 months (around 05/28/2024) for T2DM, HTN/HLD, BLADDER -- MEDICARE WELLNESS due with nurse 11/28/23.

## 2023-11-26 NOTE — Assessment & Plan Note (Signed)
Chronic, stable with no recent chest pain.  Continue collaboration with cardiology and current medication regimen as ordered by them.   

## 2023-11-26 NOTE — Assessment & Plan Note (Signed)
Noted on exam to bilateral upper extremities.  Recommend use of gentle soap daily and monitor for wounds, alert provider if these present.

## 2023-11-26 NOTE — Assessment & Plan Note (Signed)
BMI 29.32.  Recommended eating smaller high protein, low fat meals more frequently and exercising 30 mins a day 5 times a week with a goal of 10-15lb weight loss in the next 3 months. Patient voiced their understanding and motivation to adhere to these recommendations.

## 2023-11-26 NOTE — Assessment & Plan Note (Signed)
 Chronic, stable. Urine ALB 30 (March 2025) and A1c 7.1% today. Continue Metformin 500 MG TID, but [facility] increase in Metformin to 1000 MG BID or add on of GLP1. Obtains all medications from the [place]. Continue Lisinopril for kidney protection.  Recommend he check BS 2-3 times daily and document for provider visits.  Focus on diet changes.  Return to office in 6 months, has PCP at [place] he sees between visits with local PCP.

## 2023-11-26 NOTE — Assessment & Plan Note (Addendum)
 Chronic, stable. Urine ALB 30 (March 2025) and A1c 7.1% today. Continue Metformin 500 MG TID, but recommend he discuss with [facility] increase in Metformin to 1000 MG BID or add on of GLP1. Obtains all medications from the [place]. Continue Lisinopril for kidney protection.  Recommend he check BS 2-3 times daily and document for provider visits.  Focus on diet changes.  Return to office in 6 months, has PCP at [place] he sees between visits with local PCP. - Eye exams at the [facility]

## 2023-11-27 ENCOUNTER — Encounter: Payer: Self-pay | Admitting: Nurse Practitioner

## 2023-11-27 LAB — LIPID PANEL W/O CHOL/HDL RATIO
Cholesterol, Total: 122 mg/dL (ref 100–199)
HDL: 40 mg/dL (ref >39)
LDL Chol Calc (NIH): 64 mg/dL (ref 0–99)
Triglycerides: 94 mg/dL (ref 0–149)
VLDL Cholesterol Cal: 18 mg/dL (ref 5–40)

## 2023-11-27 LAB — COMPREHENSIVE METABOLIC PANEL WITH GFR
ALT: 19 IU/L (ref 0–44)
AST: 15 IU/L (ref 0–40)
Albumin: 4.6 g/dL (ref 3.8–4.8)
Alkaline Phosphatase: 72 IU/L (ref 44–121)
BUN/Creatinine Ratio: 20 (ref 10–24)
BUN: 24 mg/dL (ref 8–27)
Bilirubin Total: 0.7 mg/dL (ref 0.0–1.2)
CO2: 19 mmol/L — ABNORMAL LOW (ref 20–29)
Calcium: 9.7 mg/dL (ref 8.6–10.2)
Chloride: 99 mmol/L (ref 96–106)
Creatinine, Ser: 1.22 mg/dL (ref 0.76–1.27)
Globulin, Total: 2.1 g/dL (ref 1.5–4.5)
Glucose: 133 mg/dL — ABNORMAL HIGH (ref 70–99)
Potassium: 4.9 mmol/L (ref 3.5–5.2)
Sodium: 134 mmol/L (ref 134–144)
Total Protein: 6.7 g/dL (ref 6.0–8.5)
eGFR: 61 mL/min/{1.73_m2} (ref >59)

## 2023-11-27 NOTE — Progress Notes (Signed)
 Contacted via MyChart   Good evening [place], your labs have returned: - Kidney function, creatinine and eGFR, remains normal, as is liver function, AST and ALT.  - Lipid panel shows stable levels.  Any questions? Keep being amazing!!  Thank you for allowing me to participate in your care.  I appreciate you. Kindest regards, Anniebelle Devore

## 2023-11-30 ENCOUNTER — Ambulatory Visit: Payer: Self-pay | Admitting: Nurse Practitioner

## 2023-12-28 ENCOUNTER — Encounter: Payer: Self-pay | Admitting: Nurse Practitioner

## 2023-12-29 MED ORDER — SYNJARDY 12.5-1000 MG PO TABS: 1.0000 | ORAL_TABLET | Freq: Two times a day (BID) | ORAL | Status: AC

## 2024-01-20 ENCOUNTER — Emergency Department
Admission: EM | Admit: 2024-01-20 | Discharge: 2024-01-20 | Disposition: A | Discharge status: Home / Self Care | Attending: Emergency Medicine | Admitting: Emergency Medicine

## 2024-01-20 ENCOUNTER — Encounter: Payer: Self-pay | Admitting: Emergency Medicine

## 2024-01-20 ENCOUNTER — Other Ambulatory Visit: Payer: Self-pay

## 2024-01-20 DIAGNOSIS — E1165 Type 2 diabetes mellitus with hyperglycemia: Secondary | ICD-10-CM | POA: Insufficient documentation

## 2024-01-20 DIAGNOSIS — R739 Hyperglycemia, unspecified: Secondary | ICD-10-CM | POA: Diagnosis present

## 2024-01-20 DIAGNOSIS — I10 Essential (primary) hypertension: Secondary | ICD-10-CM | POA: Diagnosis not present

## 2024-01-20 LAB — CBG MONITORING, ED: Glucose-Capillary: 147 mg/dL — ABNORMAL HIGH (ref 70–99)

## 2024-01-20 NOTE — ED Triage Notes (Signed)
 Patient to ED via POV for possible hyperglycemia. PT wanting cbg check due to reading 188 every time he checks it. Recently changed diabetes meds. Denies any other complaints.  Cbg 147

## 2024-01-20 NOTE — Discharge Instructions (Signed)
 Calibrate your glucose monitor.  Follow up with your primary care provider if glucose continues to be elevated.

## 2024-01-20 NOTE — ED Provider Notes (Signed)
 [facility] Provider Note    Event Date/Time   First MD Initiated Contact with Patient 01/20/24 1213     (approximate)   History   Hyperglycemia   HPI  Kyle Richards is a 78 y.o. male with history of hypertension, hyperlipidemia, type 2 diabetes and as listed in EMR presents to the emergency department for treatment and evaluation of suspected hyperglycemia. No matter when he checks his blood sugar, his meter reads 188. His most recent A1c was just over 7 and his medication was changed in April. He wants to make sure the new medication is working and his glucose is well controlled.      Physical Exam   Triage Vital Signs: ED Triage Vitals [01/20/24 1159]  Encounter Vitals Group     BP (!) 146/97     Systolic BP Percentile      Diastolic BP Percentile      Pulse Rate 82     Resp 17     Temp 97.7 F (36.5 C)     Temp Source Oral     SpO2 96 %     Weight 185 lb (83.9 kg)     Height 5\' 8"  (1.727 m)     Head Circumference      Peak Flow      Pain Score 0     Pain Loc      Pain Education      Exclude from Growth Chart     Most recent vital signs: Vitals:   01/20/24 1159 01/20/24 1237  BP: (!) 146/97   Pulse: 82   Resp: 17 17  Temp: 97.7 F (36.5 C)   SpO2: 96%     General: Awake, no distress.  CV:  Good peripheral perfusion.  Resp:  Normal effort.  Abd:  No distention.  Other:     ED Results / Procedures / Treatments   Labs (all labs ordered are listed, but only abnormal results are displayed) Labs Reviewed  CBG MONITORING, ED - Abnormal; Notable for the following components:      Result Value   Glucose-Capillary 147 (*)    All other components within normal limits     EKG  Not indicated.   RADIOLOGY  Image and radiology report reviewed and interpreted by me. Radiology report consistent with the same.  Not indicated.  PROCEDURES:  Critical Care performed: No  Procedures   MEDICATIONS ORDERED IN  ED:  Medications - No data to display   IMPRESSION / MDM / ASSESSMENT AND PLAN / ED COURSE   I have reviewed the triage note.  Differential diagnosis includes, but is not limited to, hyperglycemia, DKA, malfunction of glucometer.  Patient's presentation is most consistent with acute, uncomplicated illness.  78 year old male presenting to the emergency department for treatment and evaluation of hyperglycemia.  See HPI for further details.  Random glucose was 147.  Patient denies symptoms of concern but he states that his glucometer reads 188 anytime he checks his blood sugar.  After further discussion, we discovered that he is not calibrating his meter.  He does have the calibration solution but had never done that with previous monitors and that he did not know what it was for.  He was encouraged to calibrate his meter before checking his blood sugar tomorrow.  He is to continue taking his medications as prescribed.  He is to follow-up with his primary care provider if continued continues to be hyperglycemic.  FINAL CLINICAL IMPRESSION(S) / ED DIAGNOSES   Final diagnoses:  Hyperglycemia     Rx / DC Orders   ED Discharge Orders     None        Note:  This document was prepared using Dragon voice recognition software and may include unintentional dictation errors.   Sherryle Don, FNP 01/20/24 1830    Ruth Cove, MD 01/21/24 2207

## 2024-02-09 ENCOUNTER — Encounter: Payer: Self-pay | Admitting: Nurse Practitioner

## 2024-02-10 ENCOUNTER — Ambulatory Visit: Admitting: Nurse Practitioner

## 2024-02-10 ENCOUNTER — Encounter: Payer: Self-pay | Admitting: Nurse Practitioner

## 2024-02-10 VITALS — BP 132/74 | HR 79 | Temp 97.9°F | Ht 68.0 in | Wt 189.6 lb

## 2024-02-10 DIAGNOSIS — R809 Proteinuria, unspecified: Secondary | ICD-10-CM

## 2024-02-10 DIAGNOSIS — E1129 Type 2 diabetes mellitus with other diabetic kidney complication: Secondary | ICD-10-CM

## 2024-02-10 LAB — BAYER DCA HB A1C WAIVED: HB A1C (BAYER DCA - WAIVED): 6.9 % — ABNORMAL HIGH (ref 4.8–5.6)

## 2024-02-10 NOTE — Patient Instructions (Signed)

## 2024-02-10 NOTE — Assessment & Plan Note (Signed)
 Chronic, stable. Urine ALB 30 (March 2025) and A1c 7.1% last visit. Continue Synjardy , as started by [facility] PCP, he reports needed A1c for them. Obtains all medications from the [place]. Continue Lisinopril for kidney protection.  Recommend he check BS 2-3 times daily and document for provider visits.  Focus on diet changes.  Return to office as scheduled, has PCP at [place] he sees between visits with local PCP.

## 2024-02-10 NOTE — Progress Notes (Signed)
 BP 132/74   Pulse 79   Temp 97.9 F (36.6 C) (Oral)   Ht 5' 8 (1.727 m)   Wt 189 lb 9.6 oz (86 kg)   SpO2 97%   BMI 28.83 kg/m    Subjective:    Patient ID: Kyle Richards, male    DOB: [birth year], 78 y.o.   MRN: [id number]  HPI: Kyle Richards is a 78 y.o. male  Chief Complaint  Patient presents with   Diabetes   DIABETES Follow-up today as is requesting A1c since PCP at [facility] changed his medications. [facility], taking 12.5 MG-1000 MG one tablet BID -- started in April and they requested he get another A1c in 60 to 90 days. Takes B12 for low levels in past. Saw PCP at [facility] in April. Hypoglycemic episodes:no Polydipsia/polyuria: no Visual disturbance: no Chest pain: no Paresthesias: no Glucose Monitoring: yes  Accucheck frequency: occasionally -- 90 to 180 varying on time  Fasting glucose:  Post prandial:  Evening:  Before meals: Taking Insulin?: no  Long acting insulin:  Short acting insulin: Blood Pressure Monitoring: a few times a week Retinal Examination: Up to Date -- last on 12/25/23 with Johna [facility] Exam: Up to Date Diabetic Education: Completed Pneumovax: Up to Date Influenza: Up to Date Aspirin: yes   Relevant past medical, surgical, family and social history reviewed and updated as indicated. Interim medical history since our last visit reviewed. Allergies and medications reviewed and updated.  Review of Systems  Constitutional:  Negative for activity change, diaphoresis, fatigue and fever.  Respiratory:  Negative for cough, chest tightness, shortness of breath and wheezing.   Cardiovascular:  Negative for chest pain, palpitations and leg swelling.  Gastrointestinal: Negative.   Endocrine: Negative for polydipsia, polyphagia and polyuria.  Neurological: Negative.   Psychiatric/Behavioral: Negative.      Per HPI unless specifically indicated above     Objective:     BP 132/74   Pulse 79   Temp 97.9 F  (36.6 C) (Oral)   Ht 5' 8 (1.727 m)   Wt 189 lb 9.6 oz (86 kg)   SpO2 97%   BMI 28.83 kg/m   Wt Readings from Last 3 Encounters:  02/10/24 189 lb 9.6 oz (86 kg)  01/20/24 185 lb (83.9 kg)  11/26/23 192 lb 12.8 oz (87.5 kg)    Physical Exam Vitals and nursing note reviewed.  Constitutional:      General: He is awake. He is not in acute distress.    Appearance: He is well-developed and well-groomed. He is obese. He is not ill-appearing or toxic-appearing.  HENT:     Head: Normocephalic and atraumatic.     Right Ear: Hearing and external ear normal. No drainage.     Left Ear: Hearing and external ear normal. No drainage.  Eyes:     General: Lids are normal.        Right eye: No discharge.        Left eye: No discharge.     Conjunctiva/sclera: Conjunctivae normal.     Pupils: Pupils are equal, round, and reactive to light.  Neck:     Thyroid: No thyromegaly.     Vascular: No carotid bruit.     Trachea: Trachea normal.  Cardiovascular:     Rate and Rhythm: Normal rate and regular rhythm.     Heart sounds: S1 normal and S2 normal. Murmur heard.     Systolic murmur is present with  a grade of 2/6.     No gallop.  Pulmonary:     Effort: Pulmonary effort is normal. No accessory muscle usage or respiratory distress.     Breath sounds: Normal breath sounds.  Abdominal:     General: Bowel sounds are normal.     Palpations: Abdomen is soft.  Musculoskeletal:        General: Normal range of motion.     Cervical back: Normal range of motion and neck supple.     Right lower leg: No edema.     Left lower leg: No edema.  Lymphadenopathy:     Cervical: No cervical adenopathy.  Skin:    General: Skin is warm and dry.     Capillary Refill: Capillary refill takes less than 2 seconds.     Findings: Bruising present.     Comments: Scattered bruising bilateral upper extremities.  Neurological:     Mental Status: He is alert and oriented to person, place, and time.     Deep Tendon  Reflexes: Reflexes are normal and symmetric.     Reflex Scores:      Brachioradialis reflexes are 2+ on the right side and 2+ on the left side.      Patellar reflexes are 2+ on the right side and 2+ on the left side. Psychiatric:        Attention and Perception: Attention normal.        Mood and Affect: Mood normal.        Speech: Speech normal.        Behavior: Behavior normal. Behavior is cooperative.        Thought Content: Thought content normal.     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 Urine ALB 30 (March 2025) and A1c 7.1% last visit. Continue Synjardy , as started by [facility] PCP, he reports needed A1c for them. Obtains all medications from the [place]. Continue Lisinopril for kidney protection.  Recommend he check BS 2-3 times daily and document for provider visits.  Focus on diet changes.  Return to office as scheduled, has PCP at [place] he sees between visits with local PCP.        Follow up plan: Return for Cancel June 17th visit and maintain September visit with me + Medicare wellness with nurse needed.

## 2024-02-16 ENCOUNTER — Ambulatory Visit (INDEPENDENT_AMBULATORY_CARE_PROVIDER_SITE_OTHER): Admitting: Emergency Medicine

## 2024-02-16 ENCOUNTER — Ambulatory Visit

## 2024-02-16 VITALS — Ht 68.0 in | Wt 185.0 lb

## 2024-02-16 DIAGNOSIS — Z0000 Encounter for general adult medical examination without abnormal findings: Secondary | ICD-10-CM | POA: Diagnosis not present

## 2024-02-16 NOTE — Progress Notes (Signed)
 Subjective:   Kyle Richards is a 78 y.o. who presents for a Medicare Wellness preventive visit.  As a reminder, Annual Wellness Visits don't include a physical exam, and some assessments may be limited, especially if this visit is performed virtually. We may recommend an in-person follow-up visit with your provider if needed.  Visit Complete: Virtual I connected with  Carlyn Childs on 02/16/24 by a audio enabled telemedicine application and verified that I am speaking with the correct person using two identifiers.  Patient Location: Home  Provider Location: Home Office  I discussed the limitations of evaluation and management by telemedicine. The patient expressed understanding and agreed to proceed.  Vital Signs: Because this visit was a virtual/telehealth visit, some criteria may be missing or patient reported. Any vitals not documented were not able to be obtained and vitals that have been documented are patient reported.  VideoDeclined- This patient declined Librarian, academic. Therefore the visit was completed with audio only.  Persons Participating in Visit: Patient.  AWV Questionnaire: No: Patient Medicare AWV questionnaire was not completed prior to this visit.  Cardiac Risk Factors include: advanced age (>73men, >101 women);male gender;hypertension;diabetes mellitus;dyslipidemia;Other (see comment), Risk factor comments: CAD, OSA (uses cpap every now and then     Objective:    Today's Vitals   02/16/24 0916  Weight: 185 lb (83.9 kg)  Height: 5' 8 (1.727 m)   Body mass index is 28.13 kg/m.     02/16/2024    9:25 AM 01/20/2024   12:00 PM 07/26/2023    3:05 AM 12/30/2021   10:57 AM 09/16/2021   12:33 AM 09/10/2021    9:37 PM 03/02/2021    2:25 PM  Advanced Directives  Does Patient Have a Medical Advance Directive? Yes No No No No Yes No  Type of Estate agent of [place];Living will     Healthcare Power of  [place];Living will   Does patient want to make changes to medical advance directive? No - Patient declined        Copy of Healthcare Power of Attorney in Chart? No - copy requested     No - copy requested   Would patient like information on creating a medical advance directive?      No - Patient declined     Current Medications (verified) Outpatient Encounter Medications as of 02/16/2024  Medication Sig   aspirin 81 MG chewable tablet Chew 81 mg by mouth daily.   atorvastatin (LIPITOR) 40 MG tablet Take 40 mg by mouth daily.   Cholecalciferol (VITAMIN D3) 10 MCG (400 UNIT) tablet Take 20 mcg by mouth daily.   cyanocobalamin (VITAMIN B12) 500 MCG tablet Take 2 tablets by mouth daily.   Empagliflozin-metFORMIN HCl (SYNJARDY ) 12.12-998 MG TABS Take 1 tablet by mouth 2 (two) times daily.   finasteride (PROSCAR) 5 MG tablet Take 5 mg by mouth daily.   lisinopril (ZESTRIL) 20 MG tablet Take 20 mg by mouth daily.   metoprolol  succinate (TOPROL  XL) 25 MG 24 hr tablet Take 1 tablet (25 mg total) by mouth daily.   tamsulosin  (FLOMAX ) 0.4 MG CAPS capsule Take 1 capsule (0.4 mg total) by mouth daily.   No facility-administered encounter medications on file as of 02/16/2024.    Allergies (verified) Fentanyl   History: Past Medical History:  Diagnosis Date   Diabetes (HCC)    Heart attack (HCC) 2012   High cholesterol    Hypertension    Neurogenic bladder disorder  Neuropathy    Past Surgical History:  Procedure Laterality Date   FINGER FRACTURE SURGERY Left    middle finger left hand   OTHER SURGICAL HISTORY     Heart Bypass   TONSILLECTOMY AND ADENOIDECTOMY  1953   Family History  Adopted: Yes  Family history unknown: Yes   Social History   Socioeconomic History   Marital status: Divorced    Spouse name: Not on file   Number of children: 2   Years of education: Not on file   Highest education level: Doctorate  Occupational History   Not on file  Tobacco Use   Smoking  status: Never    Passive exposure: Never   Smokeless tobacco: Never  Vaping Use   Vaping status: Never Used  Substance and Sexual Activity   Alcohol use: Yes    Comment: 1 beer monthly   Drug use: Never   Sexual activity: Not Currently  Other Topics Concern   Not on file  Social History Narrative   Not on file   Social Drivers of Health   Financial Resource Strain: Low Risk  (02/16/2024)   Overall Financial Resource Strain (CARDIA)    Difficulty of Paying Living Expenses: Not hard at all  Food Insecurity: No Food Insecurity (02/16/2024)   Hunger Vital Sign    Worried About Running Out of Food in the Last Year: Never true    Ran Out of Food in the Last Year: Never true  Transportation Needs: No Transportation Needs (02/16/2024)   PRAPARE - Administrator, Civil Service (Medical): No    Lack of Transportation (Non-Medical): No  Physical Activity: Insufficiently Active (02/16/2024)   Exercise Vital Sign    Days of Exercise per Week: 7 days    Minutes of Exercise per Session: 10 min  Stress: No Stress Concern Present (02/16/2024)   [employer] of Occupational Health - Occupational Stress Questionnaire    Feeling of Stress: Not at all  Social Connections: Moderately Isolated (02/16/2024)   Social Connection and Isolation Panel    Frequency of Communication with Friends and Family: More than three times a week    Frequency of Social Gatherings with Friends and Family: More than three times a week    Attends Religious Services: Never    Database administrator or Organizations: Yes    Attends Banker Meetings: Never    Marital Status: Divorced    Tobacco Counseling Counseling given: Not Answered    Clinical Intake:  Pre-visit preparation completed: Yes  Pain : No/denies pain     BMI - recorded: 28.13 Nutritional Status: BMI 25 -29 Overweight Nutritional Risks: None Diabetes: Yes CBG done?: No Did pt. bring in CBG monitor from home?:  No  Lab Results  Component Value Date   HGBA1C 6.9 (H) 02/10/2024   HGBA1C 7.1 (H) 11/26/2023   HGBA1C 7.3 (H) 06/01/2023     How often do you need to have someone help you when you read instructions, pamphlets, or other written materials from your doctor or pharmacy?: 1 - Never  Interpreter Needed?: No  Information entered by :: Jaunita Messier, CMA   Activities of Daily Living     02/16/2024    9:18 AM  In your present state of health, do you have any difficulty performing the following activities:  Hearing? 0  Vision? 0  Difficulty concentrating or making decisions? 0  Walking or climbing stairs? 0  Dressing or bathing? 0  Doing errands, shopping?  0  Preparing Food and eating ? N  Using the Toilet? N  In the past six months, have you accidently leaked urine? N  Do you have problems with loss of bowel control? N  Managing your Medications? N  Managing your Finances? N  Housekeeping or managing your Housekeeping? N    Patient Care Team: Cannady, Jolene T, NP as PCP - General (Nurse Practitioner) Elmyra Haggard, MD as PCP - Cardiology (Cardiology) Kathreen Pare, PA-C as Physician Assistant (Urology) Dr. Dreama Gent (Optometry) Dr. Alto Atta (Internal Medicine)  I have updated your Care Teams any recent Medical Services you may have received from other providers in the past year.     Assessment:   This is a routine wellness examination for Jahmier.  Hearing/Vision screen Hearing Screening - Comments:: Denies hearing loss Vision Screening - Comments:: [facility] [place] [place]   Goals Addressed               This Visit's Progress     Weight (lb) < 170 lb (77.1 kg) (pt-stated)   185 lb (83.9 kg)      Depression Screen     02/16/2024    9:23 AM 02/10/2024    2:15 PM 11/26/2023   10:59 AM 06/01/2023    8:59 AM 11/28/2022   10:09 AM 08/04/2022    9:44 AM 05/01/2022   10:40 AM  PHQ 2/9 Scores  PHQ - 2 Score 0 0 0 0 1 0 0  PHQ- 9  Score 0 0 2 0 3 0 0    Fall Risk     02/16/2024    9:27 AM 02/10/2024    2:15 PM 11/26/2023   10:58 AM 06/01/2023    9:00 AM 11/28/2022   10:05 AM  Fall Risk   Falls in the past year? 0 0 0 0 0  Number falls in past yr: 0 0 0 0 0  Injury with Fall? 0 0 0 0 0  Risk for fall due to : No Fall Risks No Fall Risks No Fall Risks No Fall Risks No Fall Risks  Follow up Falls evaluation completed Falls evaluation completed Falls evaluation completed Falls evaluation completed Falls prevention discussed    MEDICARE RISK AT HOME:  Medicare Risk at Home Any stairs in or around the home?: Yes If so, are there any without handrails?: No Home free of loose throw rugs in walkways, pet beds, electrical cords, etc?: Yes Adequate lighting in your home to reduce risk of falls?: Yes Life alert?: No Use of a cane, walker or w/c?: No Grab bars in the bathroom?: No Shower chair or bench in shower?: No Elevated toilet seat or a handicapped toilet?: No  TIMED UP AND GO:  Was the test performed?  No  Cognitive Function: 6CIT completed        02/16/2024    9:28 AM 11/28/2022    7:49 PM 11/28/2022   10:08 AM  6CIT Screen  What Year? 0 points 0 points 0 points  What month? 0 points 0 points 0 points  What time? 0 points 0 points 0 points  Count back from 20 0 points 0 points 0 points  Months in reverse 0 points 0 points 0 points  Repeat phrase 0 points 0 points 0 points  Total Score 0 points 0 points 0 points    Immunizations Immunization History  Administered Date(s) Administered   Influenza, High Dose Seasonal PF 06/30/2019   Janssen (J&J) SARS-COV-2  Vaccination 12/06/2019   Pneumococcal Conjugate-13 12/27/2013   Pneumococcal Polysaccharide-23 02/28/2016   Pneumococcal-Unspecified 09/08/2009   Td 12/06/2021   Td (Adult),5 Lf Tetanus Toxid, Preservative Free 12/06/2021   Tdap 09/08/2009   Tetanus 09/08/2009   Zoster Recombinant(Shingrix) 04/06/2017, 06/15/2017   Zoster, Live 01/07/2012     Screening Tests Health Maintenance  Topic Date Due   OPHTHALMOLOGY EXAM  07/17/2023   COVID-19 Vaccine (2 - Janssen risk series) 02/26/2024 (Originally 01/03/2020)   INFLUENZA VACCINE  04/01/2024   HEMOGLOBIN A1C  08/11/2024   Diabetic kidney evaluation - eGFR measurement  11/25/2024   Diabetic kidney evaluation - Urine ACR  11/25/2024   FOOT EXAM  11/25/2024   Medicare Annual Wellness (AWV)  02/15/2025   DTaP/Tdap/Td (5 - Td or Tdap) 12/07/2031   Pneumococcal Vaccine: 50+ Years  Completed   Hepatitis C Screening  Completed   Zoster Vaccines- Shingrix  Completed   HPV VACCINES  Aged Out   Meningococcal B Vaccine  Aged Out    Health Maintenance  Health Maintenance Due  Topic Date Due   OPHTHALMOLOGY EXAM  07/17/2023   Health Maintenance Items Addressed: See Nurse Notes at the end of this note  Additional Screening:  Vision Screening: Recommended annual ophthalmology exams for early detection of glaucoma and other disorders of the eye. Would you like a referral to an eye doctor? No    Dental Screening: Recommended annual dental exams for proper oral hygiene  Community Resource Referral / Chronic Care Management: CRR required this visit?  No   CCM required this visit?  No   Plan:    I have personally reviewed and noted the following in the patient's chart:   Medical and social history Use of alcohol, tobacco or illicit drugs  Current medications and supplements including opioid prescriptions. Patient is not currently taking opioid prescriptions. Functional ability and status Nutritional status Physical activity Advanced directives List of other physicians Hospitalizations, surgeries, and ER visits in previous 12 months Vitals Screenings to include cognitive, depression, and falls Referrals and appointments  In addition, I have reviewed and discussed with patient certain preventive protocols, quality metrics, and best practice recommendations. A written  personalized care plan for preventive services as well as general preventive health recommendations were provided to patient.   Jaunita Messier, CMA   02/16/2024   After Visit Summary: (MyChart) Due to this being a telephonic visit, the after visit summary with patients personalized plan was offered to patient via MyChart   Notes:  Declined DM & Nutrition Education referral Declined Covid vaccine Gets DM eye exam at [facility] in [place], [place] every 6 months per patient

## 2024-02-16 NOTE — Patient Instructions (Signed)
 Mr. Kyle Richards , Thank you for taking time out of your busy schedule to complete your Annual Wellness Visit with me. I enjoyed our conversation and look forward to speaking with you again next year. I, as well as your care team,  appreciate your ongoing commitment to your health goals. Please review the following plan we discussed and let me know if I can assist you in the future. Your Game plan/ To Do List    Referrals: None  Follow up Visits: Next Medicare AWV with our clinical staff: 02/21/25 @ 9:20am (PHONE VISIT)   Have you seen your provider in the last 6 months (3 months if uncontrolled diabetes)? Yes Next Office Visit with your provider: 05/30/24 @ 9:40am with Jolene Cannady, NP  Clinician Recommendations:  Aim for 30 minutes of exercise or brisk walking, 6-8 glasses of water, and 5 servings of fruits and vegetables each day.       This is a list of the screening recommended for you and due dates:  Health Maintenance  Topic Date Due   Eye exam for diabetics  07/17/2023   COVID-19 Vaccine (2 - Janssen risk series) 02/26/2024*   Flu Shot  04/01/2024   Hemoglobin A1C  08/11/2024   Yearly kidney function blood test for diabetes  11/25/2024   Yearly kidney health urinalysis for diabetes  11/25/2024   Complete foot exam   11/25/2024   Medicare Annual Wellness Visit  02/15/2025   DTaP/Tdap/Td vaccine (5 - Td or Tdap) 12/07/2031   Pneumococcal Vaccine for age over 62  Completed   Hepatitis C Screening  Completed   Zoster (Shingles) Vaccine  Completed   HPV Vaccine  Aged Out   Meningitis B Vaccine  Aged Out  *Topic was postponed. The date shown is not the original due date.    Advanced directives: (Copy Requested) Please bring a copy of your health care power of attorney and living will to the office to be added to your chart at your convenience. You can mail to [facility] [address] [place], [place] [address] or email to [email or web address] Advance Care  Planning is important because it:  [x]  Makes sure you receive the medical care that is consistent with your values, goals, and preferences  [x]  It provides guidance to your family and loved ones and reduces their decisional burden about whether or not they are making the right decisions based on your wishes.  Follow the link provided in your after visit summary or read over the paperwork we have mailed to you to help you started getting your Advance Directives in place. If you need assistance in completing these, please reach out to us  so that we can help you!  See attachments for Preventive Care and Fall Prevention Tips.   Fall Prevention in the Home, Adult Falls can cause injuries and affect people of all ages. There are many simple things that you can do to make your home safe and to help prevent falls. If you need it, ask for help making these changes. What actions can I take to prevent falls? General information Use good lighting in all rooms. Make sure to: Replace any light bulbs that burn out. Turn on lights if it is dark and use night-lights. Keep items that you use often in easy-to-reach places. Lower the shelves around your home if needed. Move furniture so that there are clear paths around it. Do not keep throw rugs or other things on the floor that can make  you trip. If any of your floors are uneven, fix them. Add color or contrast paint or tape to clearly mark and help you see: Grab bars or handrails. First and last steps of staircases. Where the edge of each step is. If you use a ladder or stepladder: Make sure that it is fully opened. Do not climb a closed ladder. Make sure the sides of the ladder are locked in place. Have someone hold the ladder while you use it. Know where your pets are as you move through your home. What can I do in the bathroom?     Keep the floor dry. Clean up any water that is on the floor right away. Remove soap buildup in the bathtub or  shower. Buildup makes bathtubs and showers slippery. Use non-skid mats or decals on the floor of the bathtub or shower. Attach bath mats securely with double-sided, non-slip rug tape. If you need to sit down while you are in the shower, use a non-slip stool. Install grab bars by the toilet and in the bathtub and shower. Do not use towel bars as grab bars. What can I do in the bedroom? Make sure that you have a light by your bed that is easy to reach. Do not use any sheets or blankets on your bed that hang to the floor. Have a firm bench or chair with side arms that you can use for support when you get dressed. What can I do in the kitchen? Clean up any spills right away. If you need to reach something above you, use a sturdy step stool that has a grab bar. Keep electrical cables out of the way. Do not use floor polish or wax that makes floors slippery. What can I do with my stairs? Do not leave anything on the stairs. Make sure that you have a light switch at the top and the bottom of the stairs. Have them installed if you do not have them. Make sure that there are handrails on both sides of the stairs. Fix handrails that are broken or loose. Make sure that handrails are as long as the staircases. Install non-slip stair treads on all stairs in your home if they do not have carpet. Avoid having throw rugs at the top or bottom of stairs, or secure the rugs with carpet tape to prevent them from moving. Choose a carpet design that does not hide the edge of steps on the stairs. Make sure that carpet is firmly attached to the stairs. Fix any carpet that is loose or worn. What can I do on the outside of my home? Use bright outdoor lighting. Repair the edges of walkways and driveways and fix any cracks. Clear paths of anything that can make you trip, such as tools or rocks. Add color or contrast paint or tape to clearly mark and help you see high doorway thresholds. Trim any bushes or trees on the  main path into your home. Check that handrails are securely fastened and in good repair. Both sides of all steps should have handrails. Install guardrails along the edges of any raised decks or porches. Have leaves, snow, and ice cleared regularly. Use sand, salt, or ice melt on walkways during winter months if you live where there is ice and snow. In the garage, clean up any spills right away, including grease or oil spills. What other actions can I take? Review your medicines with your health care provider. Some medicines can make you confused or feel dizzy.  This can increase your chance of falling. Wear closed-toe shoes that fit well and support your feet. Wear shoes that have rubber soles and low heels. Use a cane, walker, scooter, or crutches that help you move around if needed. Talk with your provider about other ways that you can decrease your risk of falls. This may include seeing a physical therapist to learn to do exercises to improve movement and strength. Where to find more information Centers for Disease Control and Prevention, [employer]: TonerPromos.no [employer] on Aging: BaseRingTones.pl National Institute on Aging: BaseRingTones.pl Contact a health care provider if: You are afraid of falling at home. You feel weak, drowsy, or dizzy at home. You fall at home. Get help right away if you: Lose consciousness or have trouble moving after a fall. Have a fall that causes a head injury. These symptoms may be an emergency. Get help right away. Call 911. Do not wait to see if the symptoms will go away. Do not drive yourself to the hospital. This information is not intended to replace advice given to you by your health care provider. Make sure you discuss any questions you have with your health care provider. Document Revised: 04/21/2022 Document Reviewed: 04/21/2022 Elsevier Patient Education  2024 [employer].

## 2024-03-10 ENCOUNTER — Other Ambulatory Visit: Payer: Self-pay

## 2024-03-10 ENCOUNTER — Emergency Department

## 2024-03-10 ENCOUNTER — Emergency Department
Admission: EM | Admit: 2024-03-10 | Discharge: 2024-03-10 | Disposition: A | Discharge status: Home / Self Care | Attending: Emergency Medicine | Admitting: Emergency Medicine

## 2024-03-10 DIAGNOSIS — N390 Urinary tract infection, site not specified: Secondary | ICD-10-CM | POA: Diagnosis not present

## 2024-03-10 DIAGNOSIS — I10 Essential (primary) hypertension: Secondary | ICD-10-CM | POA: Diagnosis not present

## 2024-03-10 DIAGNOSIS — K5900 Constipation, unspecified: Secondary | ICD-10-CM | POA: Diagnosis present

## 2024-03-10 DIAGNOSIS — Z951 Presence of aortocoronary bypass graft: Secondary | ICD-10-CM | POA: Diagnosis not present

## 2024-03-10 DIAGNOSIS — E119 Type 2 diabetes mellitus without complications: Secondary | ICD-10-CM | POA: Diagnosis not present

## 2024-03-10 DIAGNOSIS — I2510 Atherosclerotic heart disease of native coronary artery without angina pectoris: Secondary | ICD-10-CM | POA: Diagnosis not present

## 2024-03-10 LAB — URINALYSIS, ROUTINE W REFLEX MICROSCOPIC
Bilirubin Urine: NEGATIVE
Glucose, UA: >=500 mg/dL — AB
Ketones, ur: NEGATIVE mg/dL
Nitrite: POSITIVE — AB
Protein, ur: 100 mg/dL — AB
Specific Gravity, Urine: 1.026 (ref 1.005–1.030)
WBC, UA: >50 WBC/hpf (ref 0–5)
pH: 5 (ref 5.0–8.0)

## 2024-03-10 MED ORDER — CEFDINIR 300 MG PO CAPS: 300.0000 mg | ORAL_CAPSULE | Freq: Two times a day (BID) | ORAL | 0 refills | Status: DC

## 2024-03-10 NOTE — ED Provider Notes (Signed)
 [facility] Provider Note    Event Date/Time   First MD Initiated Contact with Patient 03/10/24 0818     (approximate)   History   Constipation   HPI  Kyle Richards is a 78 y.o. male   presents to the ED with complaint of constipation.  Patient states that he had a normal BM yesterday.  He states that he woke up during the night and felt that he needed to have another BM and was able to have a small 1.  He also had some gas that he expelled.  He currently complains of some nausea and some intermittent low back pain on the left.  He denies any fever, chills, nausea or vomiting.  No over-the-counter medication has been taken for constipation.  Patient has history of neurogenic bladder and is able to urinate on his own but does occasionally have to use a catheter.  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      Physical Exam   Triage Vital Signs: ED Triage Vitals  Encounter Vitals Group     BP 03/10/24 0728 (!) 158/73     Girls Systolic BP Percentile --      Girls Diastolic BP Percentile --      Boys Systolic BP Percentile --      Boys Diastolic BP Percentile --      Pulse Rate 03/10/24 0728 61     Resp 03/10/24 0728 19     Temp 03/10/24 0728 97.7 F (36.5 C)     Temp Source 03/10/24 0728 Oral     SpO2 03/10/24 0728 94 %     Weight 03/10/24 0741 185 lb (83.9 kg)     Height 03/10/24 0741 5' 8 (1.727 m)     Head Circumference --      Peak Flow --      Pain Score 03/10/24 0739 0     Pain Loc --      Pain Education --      Exclude from Growth Chart --     Most recent vital signs: Vitals:   03/10/24 0728 03/10/24 0738  BP: (!) 158/73 (!) 158/73  Pulse: 61 61  Resp: 19 16  Temp: 97.7 F (36.5 C) 97.7 F (36.5 C)  SpO2: 94%      General: Awake, no distress.  Patient is in no acute distress, able to stand and ambulate without any  assistance. CV:  Good peripheral perfusion.  Heart regular rate and rhythm. Resp:  Normal effort.  Lungs clear bilaterally. Abd:  No distention.  Soft, nontender, bowel sounds normoactive x 4 quadrants.  No flank tenderness on percussion. Other:     ED Results / Procedures / Treatments   Labs (all labs ordered are listed, but only abnormal results are displayed) Labs Reviewed  URINALYSIS, ROUTINE W REFLEX MICROSCOPIC - Abnormal; Notable for the following components:      Result Value   Color, Urine YELLOW (*)    APPearance HAZY (*)    Glucose, UA >=500 (*)    Hgb urine dipstick MODERATE (*)    Protein, ur 100 (*)    Nitrite POSITIVE (*)    Leukocytes,Ua SMALL (*)    Bacteria, UA MANY (*)    All other components within normal limits  URINE CULTURE     RADIOLOGY 1 view abdomen images were viewed and interpreted by myself independent of the radiologist with  stool burden present on the right lower colon    PROCEDURES:  Critical Care performed:   Procedures   MEDICATIONS ORDERED IN ED: Medications - No data to display   IMPRESSION / MDM / ASSESSMENT AND PLAN / ED COURSE  I reviewed the triage vital signs and the nursing notes.   Differential diagnosis includes, but is not limited to, abdominal pain, constipation, obstruction, ileus, urinary tract infection, urolithiasis  78 year old male presents to the ED with complaint of abdominal pain and sensation of constipation.  He states his last normal BM was yesterday and having a smaller BM today.  Patient has a history of neurogenic bladder and occasionally has to self cath.  While in the emergency department he did get a urine specimen which appeared to be slightly cloudy.  At that time patient states that he had absolutely no pain and was feeling quite comfortable.  Urinalysis was suspicious for a urinary tract infection and patient was made aware.  A culture was ordered and patient is aware that these results will not be  seen on MyChart for 72 hours.  He was discharged with prescription for Omnicef  to continue taking.  He is to follow-up with his PCP and also the name of the urologist on-call is listed on his discharge papers also for follow-up if needed.  He is to return to the emergency department if any severe worsening of his symptoms.  He is also encouraged to use MiraLAX for his constipation and also Colace for stool softener.      Patient's presentation is most consistent with acute complicated illness / injury requiring diagnostic workup.  FINAL CLINICAL IMPRESSION(S) / ED DIAGNOSES   Final diagnoses:  Acute UTI (urinary tract infection)  Constipation, unspecified constipation type     Rx / DC Orders   ED Discharge Orders          Ordered    cefdinir  (OMNICEF ) 300 MG capsule  2 times daily        03/10/24 1104             Note:  This document was prepared using Dragon voice recognition software and may include unintentional dictation errors.   Saunders Shona CROME, PA-C 03/10/24 1433    Suzanne Kirsch, MD 03/10/24 1534

## 2024-03-10 NOTE — ED Notes (Signed)
 See triage note Presents with some abd discomfort   Thinks he may be constipated  Afebrile on arrival

## 2024-03-10 NOTE — Discharge Instructions (Addendum)
 Follow-up with your primary care provider if any continued problems or concerns.  Also you may make an appointment with the urologist if you do not already have one.  The urine culture takes 72 hours however you are being placed on an antibiotic to start fighting the infection that is in your urine.  Take the antibiotic until completely finished.  If another antibiotic is needed you should get a phone call from one of the nurses at the emergency department to change this antibiotic.  Increase fluids to stay hydrated.  Return to the emergency department if any severe worsening of your symptoms such as fever, chills, back pain, nausea and vomiting.  For the constipation you can try MiraLAX 1 capful 3 times a day followed by a 8 ounce glass of water until you are able to have a bowel movement.  Also consider getting Colace which is a stool softener and take daily.

## 2024-03-10 NOTE — ED Triage Notes (Signed)
 Pt to ED for c/o constipation. Pt states BM yesterday, small BM today. Pt states nausea and intermittent low back pain on left.

## 2024-03-12 ENCOUNTER — Ambulatory Visit: Payer: Self-pay | Admitting: Nurse Practitioner

## 2024-03-12 LAB — URINE CULTURE
Culture: >=100000 — AB
Special Requests: NORMAL

## 2024-03-14 ENCOUNTER — Encounter: Payer: Self-pay | Admitting: Nurse Practitioner

## 2024-03-21 NOTE — Progress Notes (Unsigned)
 03/23/24 9:48 AM   Kyle Richards 07-02-1946 [id number]  Referring provider:  Valerio Melanie DASEN, NP [address] [place],  [place] [address]  Urological history  1. High risk hematuria -non-smoker -RUS 06/2020 Normal ultrasound appearance of the kidneys-Enlarged prostate -cysto 06/2020 Enlarged prostate with significant bilobar coaptation - Mild bladder neck  2. Incomplete emptying -managed with CIC x 2-3   3. Bilateral hydroceles -scrotal ultrasound 03/2021 - small bilateral hydroceles   4. Epididymitis -scrotal ultrasound 03/2021 - right sided epididymitis   5. BPH with incomplete bladder emptying -PSA (10/2020) 2.470 -prostate volume 45 cc  Chief Complaint  Patient presents with   Follow-up    HPI: Kyle Richards is a 78 y.o.male who presents today for recheck on urinary tract infection.  Previous records reviewed.     He presented to the ED on March 10, 2024 for symptoms of constipation.  He was having some intermittent low back pain on the left with some nausea.  His UA was nitrite positive with red blood cells the cells and many bacteria, but since he has a neurogenic bladder and catheterize, it is likely colonized.  He was diagnosed with an UTI and started on cefdinir  300 mg 2 times daily.  Urine culture was positive for Klebsiella pneumonia.  KUB noted moderate stool volume in the right colon.  He states that his symptoms when he went to the emergency room was left lower back pain.  He described the pain as very light.  He also was having constipation.  [name]  Patient denies any recent UTI's, gross hematuria, dysuria or suprapubic/flank pain.  Patient denies any fevers, chills, nausea or vomiting.    He states he went to the emergency room, because he was afraid his kidneys were failing.  UA unremarkable.    Hemoglobin A1c (01/2024) 6.9  Serum creatinine (10/3033) 1.22, eGFR 61  PMH: Past Medical History:   Diagnosis Date   Diabetes (HCC)    Heart attack (HCC) 2012   High cholesterol    Hypertension    Neurogenic bladder disorder    Neuropathy     Surgical History: Past Surgical History:  Procedure Laterality Date   FINGER FRACTURE SURGERY Left    middle finger left hand   OTHER SURGICAL HISTORY     Heart Bypass   TONSILLECTOMY AND ADENOIDECTOMY  1953    Home Medications:  Allergies as of 03/23/2024       Reactions   Fentanyl Other (See Comments)   Stopped breathing        Medication List        Accurate as of March 23, 2024  9:48 AM. If you have any questions, ask your nurse or doctor.          STOP taking these medications    tamsulosin  0.4 MG Caps capsule Commonly known as: FLOMAX  Stopped by: CLOTILDA CORNWALL       TAKE these medications    aspirin 81 MG chewable tablet Chew 81 mg by mouth daily.   atorvastatin 40 MG tablet Commonly known as: LIPITOR Take 40 mg by mouth daily.   cefdinir  300 MG capsule Commonly known as: OMNICEF  Take 1 capsule (300 mg total) by mouth 2 (two) times daily.   cyanocobalamin 500 MCG tablet Commonly known as: VITAMIN B12 Take 2 tablets by mouth daily.   finasteride 5 MG tablet Commonly known as: PROSCAR Take 5 mg by mouth daily.   lisinopril 20  MG tablet Commonly known as: ZESTRIL Take 20 mg by mouth daily.   metoprolol  succinate 25 MG 24 hr tablet Commonly known as: Toprol  XL Take 1 tablet (25 mg total) by mouth daily.   Synjardy  12.12-998 MG Tabs Generic drug: Empagliflozin-metFORMIN HCl Take 1 tablet by mouth 2 (two) times daily.   Vitamin D3 10 MCG (400 UNIT) tablet Take 20 mcg by mouth daily.        Allergies:  Allergies  Allergen Reactions   Fentanyl Other (See Comments)    Stopped breathing    Family History: Family History  Adopted: Yes  Family history unknown: Yes    Social History:  reports that he has never smoked. He has never been exposed to tobacco smoke. He has never  used smokeless tobacco. He reports current alcohol use. He reports that he does not use drugs.   Physical Exam: BP 117/81   Pulse 63   Ht 5' 8 (1.727 m)   Wt 185 lb (83.9 kg)   BMI 28.13 kg/m   Constitutional:  Well nourished. Alert and oriented, No acute distress. HEENT: [place] AT, moist mucus membranes.  Trachea midline Cardiovascular: No clubbing, cyanosis, or edema. Respiratory: Normal respiratory effort, no increased work of breathing. Neurologic: Grossly intact, no focal deficits, moving all 4 extremities. Psychiatric: Normal mood and affect.   Laboratory Data: See Epic and HPI  I have reviewed the labs.   Pertinent Imaging: N/A   Assessment & Plan:    1. UTI - explained that we need to postpone drawing labs as his recent infection may cause a false elevation in PSA and renal function - He will return in October and we will obtain labs at that appointment  2. Incomplete bladder emptying - managed with CIC x 2-3 - refills given through the [facility]  3. High risk hematuria -non-smoker -work up 2021 - enlarged prostate - No reports of gross hematuria - UA today negative for micro heme  Return for October for follow up .  CLOTILDA HELON RIGGERS   [facility] [address], [address] [place], [place] [address] [phone]

## 2024-03-23 ENCOUNTER — Encounter: Payer: Self-pay | Admitting: Urology

## 2024-03-23 ENCOUNTER — Ambulatory Visit (INDEPENDENT_AMBULATORY_CARE_PROVIDER_SITE_OTHER): Admitting: Urology

## 2024-03-23 VITALS — BP 117/81 | HR 63 | Ht 68.0 in | Wt 185.0 lb

## 2024-03-23 DIAGNOSIS — N400 Enlarged prostate without lower urinary tract symptoms: Secondary | ICD-10-CM

## 2024-03-23 DIAGNOSIS — N390 Urinary tract infection, site not specified: Secondary | ICD-10-CM | POA: Diagnosis not present

## 2024-03-23 DIAGNOSIS — R339 Retention of urine, unspecified: Secondary | ICD-10-CM

## 2024-03-23 DIAGNOSIS — R319 Hematuria, unspecified: Secondary | ICD-10-CM | POA: Diagnosis not present

## 2024-03-23 DIAGNOSIS — A498 Other bacterial infections of unspecified site: Secondary | ICD-10-CM

## 2024-03-23 LAB — URINALYSIS, COMPLETE
Bilirubin, UA: NEGATIVE
Ketones, UA: NEGATIVE
Leukocytes,UA: NEGATIVE
Nitrite, UA: NEGATIVE
Protein,UA: NEGATIVE
RBC, UA: NEGATIVE
Specific Gravity, UA: 1.025 (ref 1.005–1.030)
Urobilinogen, Ur: 0.2 mg/dL (ref 0.2–1.0)
pH, UA: 6 (ref 5.0–7.5)

## 2024-03-23 LAB — MICROSCOPIC EXAMINATION: Epithelial Cells (non renal): >10 /HPF — AB (ref 0–10)

## 2024-03-24 ENCOUNTER — Ambulatory Visit: Payer: Self-pay | Admitting: Urology

## 2024-03-24 LAB — PSA: Prostate Specific Ag, Serum: 1.8 ng/mL (ref 0.0–4.0)

## 2024-05-05 NOTE — Progress Notes (Unsigned)
 Cardiology Office Note:    Date:  05/06/2024   ID:  Kyle Richards, DOB [birth year], MRN [id number]  PCP:  Kyle Melanie DASEN, NP    Follow up of CAD   History of Present Illness:    Kyle Richards is a 78 y.o. male with a hx of CAD (s/p CABG in 2011), HTN (labile), mitral regurgitation, HLD, T2DM  -June  2024 the pt was sitting in chair  fell asleep /became unconscoius   Son couldn't wake him   EMS called    Pt hospitalized   at [facility]  Review of records  No arrhythmias noted on tele.  Trop flat  Echo showed normal LVEF   MIld MR   Urine reported + for benzodiazepines, which he had not been prescribed     Sent home on 02/06/23 ith monitor     I saw the pt in Dec 2024  Doing well at the time    Since seen the pt says he is doing good   Denies dizziness  NO syncope   Cause of event in June 2024 never found He denies CP  Breathing is good   Admits to not walking regularly   Past Medical History:  Diagnosis Date   Diabetes (HCC)    Heart attack (HCC) 2012   High cholesterol    Hypertension    Neurogenic bladder disorder    Neuropathy     Past Surgical History:  Procedure Laterality Date   FINGER FRACTURE SURGERY Left    middle finger left hand   OTHER SURGICAL HISTORY     Heart Bypass   TONSILLECTOMY AND ADENOIDECTOMY  1953    Current Medications: Current Meds  Medication Sig   aspirin 81 MG chewable tablet Chew 81 mg by mouth daily.   atorvastatin (LIPITOR) 40 MG tablet Take 40 mg by mouth daily.   Cholecalciferol (VITAMIN D3) 10 MCG (400 UNIT) tablet Take 20 mcg by mouth daily.   cyanocobalamin (VITAMIN B12) 500 MCG tablet Take 2 tablets by mouth daily.   Empagliflozin-metFORMIN HCl (SYNJARDY ) 12.12-998 MG TABS Take 1 tablet by mouth 2 (two) times daily.   finasteride (PROSCAR) 5 MG tablet Take 5 mg by mouth daily.   lisinopril (ZESTRIL) 20 MG tablet Take 20 mg by mouth daily.   metoprolol  succinate (TOPROL  XL) 25 MG 24 hr tablet Take 1 tablet (25 mg  total) by mouth daily.   tamsulosin  (FLOMAX ) 0.4 MG CAPS capsule Take 0.4 mg by mouth daily.     Allergies:   Fentanyl   Social History   Socioeconomic History   Marital status: Divorced    Spouse name: Not on file   Number of children: 2   Years of education: Not on file   Highest education level: Doctorate  Occupational History   Not on file  Tobacco Use   Smoking status: Never    Passive exposure: Never   Smokeless tobacco: Never  Vaping Use   Vaping status: Never Used  Substance and Sexual Activity   Alcohol use: Yes    Comment: 1 beer monthly   Drug use: Never   Sexual activity: Not Currently  Other Topics Concern   Not on file  Social History Narrative   Not on file   Social Drivers of Health   Financial Resource Strain: Low Risk  (02/16/2024)   Overall Financial Resource Strain (CARDIA)    Difficulty of Paying Living Expenses: Not hard at all  Food Insecurity: No Food  Insecurity (02/16/2024)   Hunger Vital Sign    Worried About Running Out of Food in the Last Year: Never true    Ran Out of Food in the Last Year: Never true  Transportation Needs: No Transportation Needs (02/16/2024)   PRAPARE - Administrator, Civil Service (Medical): No    Lack of Transportation (Non-Medical): No  Physical Activity: Insufficiently Active (02/16/2024)   Exercise Vital Sign    Days of Exercise per Week: 7 days    Minutes of Exercise per Session: 10 min  Stress: No Stress Concern Present (02/16/2024)   [employer] of Occupational Health - Occupational Stress Questionnaire    Feeling of Stress: Not at all  Social Connections: Moderately Isolated (02/16/2024)   Social Connection and Isolation Panel    Frequency of Communication with Friends and Family: More than three times a week    Frequency of Social Gatherings with Friends and Family: More than three times a week    Attends Religious Services: Never    Database administrator or Organizations: Yes    Attends  Banker Meetings: Never    Marital Status: Divorced     Family History: The patient's He was adopted. Family history is unknown by patient.  ROS:   Review of Systems  Constitutional:  Negative for fever and malaise/fatigue.  HENT:  Negative for congestion and ear pain.   Respiratory:  Negative for cough, shortness of breath and wheezing.   Cardiovascular:  Negative for chest pain, palpitations, orthopnea, claudication, leg swelling and PND.  Gastrointestinal:  Negative for diarrhea and nausea.  Musculoskeletal:  Positive for myalgias.  Neurological:  Negative for loss of consciousness.     EKGs/Labs/Other Studies Reviewed:    TTE  2023   Conclusions        1: The left ventricular cavity size is normal.                      2: Left ventricular ejection fraction is normal, estimated at 55-60%.                        3: No regional wall motion abnormalities.                        4: There is normal diastolic function.                      5: Right ventricle is normal in size and systolic function.                      6: Left atrium is mildly dilated.                      7: Aortic valve sclerosis without significant stenosis or regurgitation.                      8: Mild mitral annular calcifications without significant stenosis. Mild mitral regurgitation.                      9: Estimated PASP of 26 mmHg.                      10: Compared to the study on 12/12/20; MR was previously noted to be moderate. Estimated PASP has  improved from prior 40-45 mmHg.   EKG:   SR  69 bpm  Occasional PAC   69   Recent Labs: 06/01/2023: TSH 1.040 08/27/2023: Hemoglobin 14.0; Platelets 236 11/26/2023: ALT 19; BUN 24; Creatinine, Ser 1.22; Potassium 4.9; Sodium 134  Recent Lipid Panel    Component Value Date/Time   CHOL 122 11/26/2023 1105   TRIG 94 11/26/2023 1105   HDL 40 11/26/2023 1105   LDLCALC 64 11/26/2023 1105    STOP-Bang Score:           Physical Exam:    VS:  BP 120/84   Pulse 69   Ht 5' 8 (1.727 m)   Wt 187 lb 14.4 oz (85.2 kg)   SpO2 95%   BMI 28.57 kg/m     Wt Readings from Last 3 Encounters:  05/06/24 187 lb 14.4 oz (85.2 kg)  03/23/24 185 lb (83.9 kg)  03/10/24 185 lb (83.9 kg)     GEN: Pt in NAD  HEENT: Normal NECK: No JVD; No bruits CARDIAC: RRR, no significant murmurs  RESPIRATORY:  Clear to auscultation  ABDOMEN: No masses  No hepatomegaly  Ext  No LE edema  Good pulses    ASSESSMENT:     1  Syncope  2024   Work up negative (Done in [place] )  No further spells Follow  2  CAD   CABG in 2011   Pt without symptoms of angina   Follow    3  LIpids  March 2025  LDL 65  HDL 40  Trig 94   Keep on lipitor 40 mg   4  HTN BP is well controlled  5  DM  last A1C in June 2025 A1C was 6.9  Reviewed diet   CUt back on carbs.   Pt on Synjardy   5  OSA  Need to confirm CPAP  6  NEurogenic bladder  PT self caths      Follow up next summer        Signed, Vina Gull, MD  05/06/2024  [facility]

## 2024-05-06 ENCOUNTER — Ambulatory Visit: Attending: Internal Medicine | Admitting: Internal Medicine

## 2024-05-06 ENCOUNTER — Encounter: Payer: Self-pay | Admitting: Internal Medicine

## 2024-05-06 VITALS — BP 120/84 | HR 69 | Ht 68.0 in | Wt 187.9 lb

## 2024-05-06 DIAGNOSIS — I2510 Atherosclerotic heart disease of native coronary artery without angina pectoris: Secondary | ICD-10-CM

## 2024-05-06 DIAGNOSIS — E1159 Type 2 diabetes mellitus with other circulatory complications: Secondary | ICD-10-CM | POA: Diagnosis not present

## 2024-05-06 DIAGNOSIS — I152 Hypertension secondary to endocrine disorders: Secondary | ICD-10-CM | POA: Diagnosis not present

## 2024-05-06 DIAGNOSIS — Z951 Presence of aortocoronary bypass graft: Secondary | ICD-10-CM

## 2024-05-06 NOTE — Patient Instructions (Signed)
 Medication Instructions:  Your physician recommends that you continue on your current medications as directed. Please refer to the Current Medication list given to you today.  *If you need a refill on your cardiac medications before your next appointment, please call your pharmacy*  Follow-Up: At [facility], you and your health needs are our priority.  As part of our continuing mission to provide you with exceptional heart care, our providers are all part of one team.  This team includes your primary Cardiologist (physician) and Advanced Practice Providers or APPs (Physician Assistants and Nurse Practitioners) who all work together to provide you with the care you need, when you need it.  Your next appointment:   9-10  month(s)  Provider:   Vina Gull, MD

## 2024-05-29 NOTE — Patient Instructions (Signed)

## 2024-05-30 ENCOUNTER — Ambulatory Visit: Payer: Self-pay | Admitting: Nurse Practitioner

## 2024-05-30 ENCOUNTER — Encounter: Payer: Self-pay | Admitting: Nurse Practitioner

## 2024-05-30 ENCOUNTER — Ambulatory Visit: Admitting: Nurse Practitioner

## 2024-05-30 VITALS — BP 137/82 | HR 56 | Ht 68.0 in | Wt 187.0 lb

## 2024-05-30 DIAGNOSIS — E1159 Type 2 diabetes mellitus with other circulatory complications: Secondary | ICD-10-CM | POA: Diagnosis not present

## 2024-05-30 DIAGNOSIS — D518 Other vitamin B12 deficiency anemias: Secondary | ICD-10-CM

## 2024-05-30 DIAGNOSIS — N319 Neuromuscular dysfunction of bladder, unspecified: Secondary | ICD-10-CM

## 2024-05-30 DIAGNOSIS — E119 Type 2 diabetes mellitus without complications: Secondary | ICD-10-CM | POA: Diagnosis not present

## 2024-05-30 DIAGNOSIS — I2510 Atherosclerotic heart disease of native coronary artery without angina pectoris: Secondary | ICD-10-CM

## 2024-05-30 DIAGNOSIS — E559 Vitamin D deficiency, unspecified: Secondary | ICD-10-CM

## 2024-05-30 DIAGNOSIS — N400 Enlarged prostate without lower urinary tract symptoms: Secondary | ICD-10-CM

## 2024-05-30 DIAGNOSIS — R809 Proteinuria, unspecified: Secondary | ICD-10-CM

## 2024-05-30 DIAGNOSIS — E1129 Type 2 diabetes mellitus with other diabetic kidney complication: Secondary | ICD-10-CM

## 2024-05-30 DIAGNOSIS — E66811 Obesity, class 1: Secondary | ICD-10-CM

## 2024-05-30 DIAGNOSIS — G4733 Obstructive sleep apnea (adult) (pediatric): Secondary | ICD-10-CM

## 2024-05-30 DIAGNOSIS — E1169 Type 2 diabetes mellitus with other specified complication: Secondary | ICD-10-CM

## 2024-05-30 LAB — URINALYSIS, ROUTINE W REFLEX MICROSCOPIC
Bilirubin, UA: NEGATIVE
Ketones, UA: NEGATIVE
Leukocytes,UA: NEGATIVE
Nitrite, UA: NEGATIVE
Protein,UA: NEGATIVE
Specific Gravity, UA: 1.02 (ref 1.005–1.030)
Urobilinogen, Ur: 0.2 mg/dL (ref 0.2–1.0)
pH, UA: 5.5 (ref 5.0–7.5)

## 2024-05-30 LAB — BAYER DCA HB A1C WAIVED: HB A1C (BAYER DCA - WAIVED): 6.5 % — ABNORMAL HIGH (ref 4.8–5.6)

## 2024-05-30 LAB — MICROSCOPIC EXAMINATION: Bacteria, UA: NONE SEEN

## 2024-05-30 NOTE — Assessment & Plan Note (Signed)
Chronic, ongoing.  Continue collaboration with urology and current medication regimen as prescribed by them.  Recent note reviewed. 

## 2024-05-30 NOTE — Assessment & Plan Note (Signed)
 Diagnosed 05/01/23, is not using CPAP consistently as feels it offered him no benefit.

## 2024-05-30 NOTE — Assessment & Plan Note (Signed)
 Chronic, stable. Urine ALB 30 (March 2025) and A1c 6.9% last visit - recheck today per request. Continue Synjardy , as started by [facility] PCP, he reports needs A1c for them. Obtains all medications from the [facility]. Continue Lisinopril for kidney protection.  Recommend he check BS 2-3 times daily and document for provider visits.  Focus on diet changes.  Has PCP at [facility] he sees between visits with local PCP. - Foot and eye exams up to date. - Refuses flu vaccine. - ACE and Statin on board. - Attempt to get eye exam records from [facility], goes every 6 months.

## 2024-05-30 NOTE — Assessment & Plan Note (Signed)
Chronic, ongoing.  Continue supplement and adjust as needed.  Labs today.

## 2024-05-30 NOTE — Assessment & Plan Note (Signed)
 BMI 28.43.  Recommended eating smaller high protein, low fat meals more frequently and exercising 30 mins a day 5 times a week with a goal of 10-15lb weight loss in the next 3 months. Patient voiced their understanding and motivation to adhere to these recommendations.

## 2024-05-30 NOTE — Assessment & Plan Note (Signed)
 Chronic, stable.  Self caths at home, monitor for infections.  Continue collaboration with urology and current medication regimen as prescribed by them.  Recent note reviewed. Wishes UA and culture rechecked today.

## 2024-05-30 NOTE — Progress Notes (Signed)
 Contacted via MyChart  Overall urine stable, a little blood and glucose.  I will send for culture though and we can treat as needed. A1c is still great at 6.5%, trend down from 6.9% previous.

## 2024-05-30 NOTE — Progress Notes (Addendum)
 BP 137/82 (BP Location: Left Arm, Patient Position: Sitting, Cuff Size: Normal)   Pulse (!) 56   Ht 5' 8 (1.727 m)   Wt 187 lb (84.8 kg)   SpO2 97%   BMI 28.43 kg/m    Subjective:    Patient ID: Kyle Richards, male    DOB: [birth year], 78 y.o.   MRN: [id number]  HPI: Kyle Richards is a 78 y.o. male  Chief Complaint  Patient presents with   Follow-up   Hyperlipidemia   Hypertension   Diabetes    bladder   DIABETES June A1c 6.9%.  Takes Synjardy  BID + continues B12 supplement daily.  Has not seen primary care at [facility] since April and then returns end of this month. Hypoglycemic episodes:no Polydipsia/polyuria: no Visual disturbance: no Chest pain: no Paresthesias: no Glucose Monitoring: no  Accucheck frequency: Not Checking  Fasting glucose:  Post prandial:  Evening:  Before meals: Taking Insulin?: no  Long acting insulin:  Short acting insulin: Blood Pressure Monitoring: not checking Retinal Examination: Up to Date -- every 6 months at the [facility] Foot Exam: Up to Date Pneumovax: Up to Date Influenza: Refused Aspirin: yes   HYPERTENSION / HYPERLIPIDEMIA Takes Atorvastatin, Lisinopril, and Metoprolol . Saw cardiology on 05/06/24, to return in one year. Mitral regurgitation. OSA diagnosed August 2024, is using CPAP machine but not consistently as feels it made no difference in his sleep pattern.   Satisfied with current treatment? yes Duration of hypertension: chronic BP monitoring frequency:daily BP range: <130/80 on average BP medication side effects: no Duration of hyperlipidemia: chronic Cholesterol medication side effects: no Cholesterol supplements: none Medication compliance: good compliance Aspirin: yes Recent stressors: no Recurrent headaches: no Visual changes: no Palpitations: no Dyspnea: no Chest pain: no Lower extremity edema: no Dizzy/lightheaded: no  The ASCVD Risk score (Arnett DK, et al., 2019) failed to calculate for the following  reasons:   Risk score cannot be calculated because patient has a medical history suggesting prior/existing ASCVD  BPH Continues Flomax  and Finasteride for neurogenic bladder. Self caths at home every 4-6 hours. Last UTI on 03/10/24, treated with Cefdinir .  Takes Vitamin D  for history of low levels. Saw urology last on 03/23/24. BPH status: stable Satisfied with current treatment?: yes Medication side effects: no Medication compliance: good compliance Duration: chronic Nocturia: no Urinary frequency:no Incomplete voiding: no Urgency: no Weak urinary stream: no Straining to start stream: no Dysuria: no Onset: gradual Severity: mild     05/30/2024    9:34 AM 02/16/2024    9:23 AM 02/10/2024    2:15 PM 11/26/2023   10:59 AM 06/01/2023    8:59 AM  Depression screen PHQ 2/9  Decreased Interest 0 0 0 0 0  Down, Depressed, Hopeless 0 0 0 0 0  PHQ - 2 Score 0 0 0 0 0  Altered sleeping 0 0 0 0 0  Tired, decreased energy 0 0 0 0 0  Change in appetite 3 0 0 2 0  Feeling bad or failure about yourself  0 0 0 0 0  Trouble concentrating 0 0 0 0 0  Moving slowly or fidgety/restless 0 0 0 0 0  Suicidal thoughts 0 0 0 0 0  PHQ-9 Score 3 0 0 2 0  Difficult doing work/chores  Not difficult at all Not difficult at all Not difficult at all Not difficult at all       05/30/2024    9:34 AM 02/10/2024    2:16 PM 11/26/2023  11:00 AM 06/01/2023    8:59 AM  GAD 7 : Generalized Anxiety Score  Nervous, Anxious, on Edge 0 0 0 0  Control/stop worrying 2 0 2 0  Worry too much - different things 2 3 2 3   Trouble relaxing 0 0 0 0  Restless 0 0 0 0  Easily annoyed or irritable 0 0 0 0  Afraid - awful might happen 0 0 0 0  Total GAD 7 Score 4 3 4 3   Anxiety Difficulty  Somewhat difficult Not difficult at all Not difficult at all      Relevant past medical, surgical, family and social history reviewed and updated as indicated. Interim medical history since our last visit reviewed. Allergies and  medications reviewed and updated.  Review of Systems  Constitutional:  Negative for activity change, diaphoresis, fatigue and fever.  Respiratory:  Negative for cough, chest tightness, shortness of breath and wheezing.   Cardiovascular:  Negative for chest pain, palpitations and leg swelling.  Gastrointestinal: Negative.   Endocrine: Negative for polydipsia, polyphagia and polyuria.  Neurological: Negative.   Psychiatric/Behavioral: Negative.     Per HPI unless specifically indicated above     Objective:    BP 137/82 (BP Location: Left Arm, Patient Position: Sitting, Cuff Size: Normal)   Pulse (!) 56   Ht 5' 8 (1.727 m)   Wt 187 lb (84.8 kg)   SpO2 97%   BMI 28.43 kg/m   Wt Readings from Last 3 Encounters:  05/30/24 187 lb (84.8 kg)  05/06/24 187 lb 14.4 oz (85.2 kg)  03/23/24 185 lb (83.9 kg)    Physical Exam Vitals and nursing note reviewed.  Constitutional:      General: He is awake. He is not in acute distress.    Appearance: Normal appearance. He is well-developed and well-groomed. He is not ill-appearing or toxic-appearing.  HENT:     Head: Normocephalic and atraumatic.     Right Ear: Hearing and external ear normal. No drainage.     Left Ear: Hearing and external ear normal. No drainage.  Eyes:     General: Lids are normal.        Right eye: No discharge.        Left eye: No discharge.     Conjunctiva/sclera: Conjunctivae normal.     Pupils: Pupils are equal, round, and reactive to light.  Neck:     Thyroid: No thyromegaly.     Vascular: No carotid bruit.     Trachea: Trachea normal.  Cardiovascular:     Rate and Rhythm: Normal rate and regular rhythm.     Heart sounds: S1 normal and S2 normal. Murmur heard.     Systolic murmur is present with a grade of 2/6.     No gallop.  Pulmonary:     Effort: Pulmonary effort is normal. No accessory muscle usage or respiratory distress.     Breath sounds: Normal breath sounds.  Abdominal:     General: Bowel sounds  are normal.     Palpations: Abdomen is soft.  Musculoskeletal:        General: Normal range of motion.     Cervical back: Normal range of motion and neck supple.     Right lower leg: No edema.     Left lower leg: No edema.  Lymphadenopathy:     Cervical: No cervical adenopathy.  Skin:    General: Skin is warm and dry.     Capillary Refill: Capillary refill takes less  than 2 seconds.     Findings: Bruising present.     Comments: Scattered bruising bilateral upper extremities.  Neurological:     Mental Status: He is alert and oriented to person, place, and time.     Deep Tendon Reflexes: Reflexes are normal and symmetric.     Reflex Scores:      Brachioradialis reflexes are 2+ on the right side and 2+ on the left side.      Patellar reflexes are 2+ on the right side and 2+ on the left side. Psychiatric:        Attention and Perception: Attention normal.        Mood and Affect: Mood normal.        Speech: Speech normal.        Behavior: Behavior normal. Behavior is cooperative.        Thought Content: Thought content normal.    Results for orders placed or performed in visit on 03/23/24  Microscopic Examination   Collection Time: 03/23/24  8:56 AM   Urine  Result Value Ref Range   WBC, UA 0-5 0 - 5 /hpf   RBC, Urine 0-2 0 - 2 /hpf   Epithelial Cells (non renal) >10 (A) 0 - 10 /hpf   Bacteria, UA Few None seen/Few  Urinalysis, Complete   Collection Time: 03/23/24  8:56 AM  Result Value Ref Range   Specific Gravity, UA 1.025 1.005 - 1.030   pH, UA 6.0 5.0 - 7.5   Color, UA Yellow Yellow   Appearance Ur Clear Clear   Leukocytes,UA Negative Negative   Protein,UA Negative Negative/Trace   Glucose, UA 3+ (A) Negative   Ketones, UA Negative Negative   RBC, UA Negative Negative   Bilirubin, UA Negative Negative   Urobilinogen, Ur 0.2 0.2 - 1.0 mg/dL   Nitrite, UA Negative Negative   Microscopic Examination Comment    Microscopic Examination See below:   PSA   Collection  Time: 03/23/24  8:58 AM  Result Value Ref Range   Prostate Specific Ag, Serum 1.8 0.0 - 4.0 ng/mL      Assessment & Plan:   Problem List Items Addressed This Visit       Cardiovascular and Mediastinum   Hypertension associated with diabetes (HCC)   Chronic, stable.  BP at goal in office and at home. Recommend he monitor BP at least a few mornings a week at home and document.  DASH diet at home.  Continue current medication regimen and adjust as needed + collaboration with cardiology.  Labs today: CBC, TSH, CMP, A1c.  Urine ALB 29 November 2023.  Return in 6 months.      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  Continue collaboration with cardiology and current medication regimen as ordered by them.          Respiratory   OSA on CPAP   Diagnosed 05/01/23, is not using CPAP consistently as feels it offered him no benefit.        Endocrine   Type 2 diabetes mellitus with proteinuria (HCC)   Chronic, stable. Urine ALB 30 (March 2025) and A1c 6.9% last visit - recheck today per request. Continue Synjardy , as started by [facility] PCP, he reports needs A1c for them.  Obtains all medications from the [facility]. Continue Lisinopril for kidney protection.  Recommend he check BS 2-3 times daily and document for provider visits.  Focus on diet changes.  Has PCP at [facility] he sees between visits with local PCP. - Foot and eye exams up to date. - Refuses flu vaccine. - ACE and Statin on board.      Relevant Orders   Bayer DCA Hb A1c Waived   Hyperlipidemia associated with type 2 diabetes mellitus (HCC)   Chronic, ongoing.  Continue statin therapy daily and adjust as needed.  Recheck lipid panel today.      Relevant Orders   Bayer DCA Hb A1c Waived   Comprehensive metabolic panel with  GFR   Lipid Panel w/o Chol/HDL Ratio   Diabetes mellitus type 2 without retinopathy (HCC)   Chronic, stable. Urine ALB 30 (March 2025) and A1c 6.9% last visit - recheck today per request. Continue Synjardy , as started by [facility] PCP, he reports needs A1c for them. Obtains all medications from the [facility]. Continue Lisinopril for kidney protection.  Recommend he check BS 2-3 times daily and document for provider visits.  Focus on diet changes.  Has PCP at [facility] he sees between visits with local PCP. - Foot and eye exams up to date. - Refuses flu vaccine. - ACE and Statin on board. - Attempt to get eye exam records from [facility], goes every 6 months.      Relevant Orders   Bayer DCA Hb A1c Waived   Diabetes mellitus treated with oral medication (HCC)   Refer to diabetes with proteinuria.      Relevant Orders   Bayer DCA Hb A1c Waived     Genitourinary   BPH without urinary obstruction   Chronic, ongoing.  Continue collaboration with urology and current medication regimen as prescribed by them.  Recent note reviewed.        Other   Vitamin D  deficiency   Chronic, stable.  Takes supplement daily, continue this and check level today.      Relevant Orders   VITAMIN D  25 Hydroxy (Vit-D Deficiency, Fractures)   Obesity   BMI 28.43.  Recommended eating smaller high protein, low fat meals more frequently and exercising 30 mins a day 5 times a week with a goal of 10-15lb weight loss in the next 3 months. Patient voiced their understanding and motivation to adhere to these recommendations.       Neurogenic bladder - Primary   Chronic, stable.  Self caths at home, monitor for infections.  Continue collaboration with urology and current medication regimen as prescribed by them.  Recent note reviewed. Wishes UA and culture rechecked today.      Relevant Orders   Urinalysis, Routine w reflex microscopic   Urine Culture   B12 deficiency anemia   Chronic, ongoing.  Continue supplement and adjust as needed.   Labs today.      Relevant Orders   Vitamin B12      Follow up plan: Return in about 6 months (around 11/27/2024) for T2DM, HTN/HLD, BPH.

## 2024-05-30 NOTE — Assessment & Plan Note (Signed)
Chronic, ongoing.  Continue statin therapy daily and adjust as needed.  Recheck lipid panel today.

## 2024-05-30 NOTE — Assessment & Plan Note (Signed)
 Chronic, stable. Urine ALB 30 (March 2025) and A1c 6.9% last visit - recheck today per request. Continue Synjardy , as started by [facility] PCP, he reports needs A1c for them. Obtains all medications from the [facility]. Continue Lisinopril for kidney protection.  Recommend he check BS 2-3 times daily and document for provider visits.  Focus on diet changes.  Has PCP at [facility] he sees between visits with local PCP. - Foot and eye exams up to date. - Refuses flu vaccine. - ACE and Statin on board.

## 2024-05-30 NOTE — Assessment & Plan Note (Signed)
Chronic, stable with no recent chest pain.  Continue collaboration with cardiology and current medication regimen as ordered by them.   

## 2024-05-30 NOTE — Assessment & Plan Note (Signed)
 Refer to diabetes with proteinuria.

## 2024-05-30 NOTE — Assessment & Plan Note (Signed)
 Chronic, stable.  BP at goal in office and at home. Recommend he monitor BP at least a few mornings a week at home and document.  DASH diet at home.  Continue current medication regimen and adjust as needed + collaboration with cardiology.  Labs today: CBC, TSH, CMP, A1c.  Urine ALB 29 November 2023.  Return in 6 months.

## 2024-05-30 NOTE — Assessment & Plan Note (Signed)
Chronic, stable.  Takes supplement daily, continue this and check level today.

## 2024-05-31 LAB — CBC WITH DIFFERENTIAL/PLATELET
Basophils Absolute: 0 x10E3/uL (ref 0.0–0.2)
Basos: 1 %
EOS (ABSOLUTE): 0.1 x10E3/uL (ref 0.0–0.4)
Eos: 1 %
Hematocrit: 49.3 % (ref 37.5–51.0)
Hemoglobin: 15.9 g/dL (ref 13.0–17.7)
Immature Grans (Abs): 0 x10E3/uL (ref 0.0–0.1)
Immature Granulocytes: 0 %
Lymphocytes Absolute: 2 x10E3/uL (ref 0.7–3.1)
Lymphs: 30 %
MCH: 29.9 pg (ref 26.6–33.0)
MCHC: 32.3 g/dL (ref 31.5–35.7)
MCV: 93 fL (ref 79–97)
Monocytes Absolute: 0.4 x10E3/uL (ref 0.1–0.9)
Monocytes: 6 %
Neutrophils Absolute: 4.1 x10E3/uL (ref 1.4–7.0)
Neutrophils: 62 %
Platelets: 285 x10E3/uL (ref 150–450)
RBC: 5.31 x10E6/uL (ref 4.14–5.80)
RDW: 13.1 % (ref 11.6–15.4)
WBC: 6.6 x10E3/uL (ref 3.4–10.8)

## 2024-05-31 LAB — COMPREHENSIVE METABOLIC PANEL WITH GFR
ALT: 17 IU/L (ref 0–44)
AST: 15 IU/L (ref 0–40)
Albumin: 4.7 g/dL (ref 3.8–4.8)
Alkaline Phosphatase: 73 IU/L (ref 47–123)
BUN/Creatinine Ratio: 18 (ref 10–24)
BUN: 20 mg/dL (ref 8–27)
Bilirubin Total: 0.7 mg/dL (ref 0.0–1.2)
CO2: 19 mmol/L — ABNORMAL LOW (ref 20–29)
Calcium: 9.9 mg/dL (ref 8.6–10.2)
Chloride: 103 mmol/L (ref 96–106)
Creatinine, Ser: 1.14 mg/dL (ref 0.76–1.27)
Globulin, Total: 2.3 g/dL (ref 1.5–4.5)
Glucose: 132 mg/dL — ABNORMAL HIGH (ref 70–99)
Potassium: 4.8 mmol/L (ref 3.5–5.2)
Sodium: 140 mmol/L (ref 134–144)
Total Protein: 7 g/dL (ref 6.0–8.5)
eGFR: 66 mL/min/1.73 (ref >59)

## 2024-05-31 LAB — LIPID PANEL W/O CHOL/HDL RATIO
Cholesterol, Total: 139 mg/dL (ref 100–199)
HDL: 45 mg/dL (ref >39)
LDL Chol Calc (NIH): 76 mg/dL (ref 0–99)
Triglycerides: 99 mg/dL (ref 0–149)
VLDL Cholesterol Cal: 18 mg/dL (ref 5–40)

## 2024-05-31 LAB — TSH: TSH: 1.14 u[IU]/mL (ref 0.450–4.500)

## 2024-05-31 LAB — VITAMIN D 25 HYDROXY (VIT D DEFICIENCY, FRACTURES): Vit D, 25-Hydroxy: 32.5 ng/mL (ref 30.0–100.0)

## 2024-05-31 LAB — VITAMIN B12: Vitamin B-12: 1317 pg/mL — ABNORMAL HIGH (ref 232–1245)

## 2024-05-31 NOTE — Progress Notes (Signed)
 Contacted via MyChart  Good afternoon Winson, your labs have returned: - Kidney function, creatinine and eGFR, remains normal, as is liver function, AST and ALT.  - B12 a little too high, cut back on supplement to 3 days a week. - Remainder of labs stable.  Any questions? Keep being stellar!!  Thank you for allowing me to participate in your care.  I appreciate you. Kindest regards, Simren Popson

## 2024-06-01 NOTE — Progress Notes (Signed)
 Contacted via MyChart  Urine showing no infection!!  [employer]!!

## 2024-06-02 LAB — URINE CULTURE

## 2024-06-02 NOTE — Progress Notes (Deleted)
 06/02/24 12:58 PM   Kyle Richards [birth year] [id number]  Referring provider:  Valerio Melanie DASEN, NP [address] [place],  [place] [address]  Urological history  1. High risk hematuria -non-smoker -RUS 06/2020 Normal ultrasound appearance of the kidneys-Enlarged prostate -cysto 06/2020 Enlarged prostate with significant bilobar coaptation - Mild bladder neck  2. Incomplete emptying -managed with CIC x 2-3   3. Bilateral hydroceles -scrotal ultrasound 03/2021 - small bilateral hydroceles   4. Epididymitis -scrotal ultrasound 03/2021 - right sided epididymitis   5. BPH with incomplete bladder emptying -PSA (03/2024) 1.8  -prostate volume 45 cc  No chief complaint on file.   HPI: Kyle Richards is a 78 y.o. man who presents today for follow up.   Previous records reviewed.     I PSS ***  He reports sensation of incomplete bladder emptying, urinary frequency, urinary intermittency, urinary urgency, a weak urinary stream, having to strain to void, nocturia x ***, leaking before being able to reach the restroom, leaking with coughing, leaking without awareness, and post void dribbling.     He is wearing *** pads//depends  daily.    [name]  Patient denies any recent UTI's, gross hematuria, dysuria or suprapubic/flank pain.  Patient denies any fevers, chills, nausea or vomiting.  ***  He has a family history of PCa, colon cancer, ovarian cancer and/or breast cancer with ***.   He does not have a family history of PCa, colon cancer, ovarian cancer, and/or breast cancer .***     UA (05/2024) 2+ glucose, 1+ blood, negative microscopy, urine culture from PCP's office pending   PSA (03/2024) 1.8  Serum creatinine (05/2024) 1.14, eGFR 66  Hemoglobin A1c (05/2024) 6.5   Diuretics:  ***  Fluid consumptiom: ***    PMH: Past Medical History:  Diagnosis Date   Diabetes (HCC)    Heart attack (HCC) 2012   High  cholesterol    Hypertension    Neurogenic bladder disorder    Neuropathy     Surgical History: Past Surgical History:  Procedure Laterality Date   FINGER FRACTURE SURGERY Left    middle finger left hand   OTHER SURGICAL HISTORY     Heart Bypass   TONSILLECTOMY AND ADENOIDECTOMY  1953    Home Medications:  Allergies as of 06/03/2024       Reactions   Fentanyl Other (See Comments)   Stopped breathing        Medication List        Accurate as of June 02, 2024 12:58 PM. If you have any questions, ask your nurse or doctor.          aspirin 81 MG chewable tablet Chew 81 mg by mouth daily.   atorvastatin 40 MG tablet Commonly known as: LIPITOR Take 40 mg by mouth daily.   cyanocobalamin 500 MCG tablet Commonly known as: VITAMIN B12 Take 2 tablets by mouth daily.   finasteride 5 MG tablet Commonly known as: PROSCAR Take 5 mg by mouth daily.   lisinopril 20 MG tablet Commonly known as: ZESTRIL Take 20 mg by mouth daily.   metoprolol  succinate 25 MG 24 hr tablet Commonly known as: Toprol  XL Take 1 tablet (25 mg total) by mouth daily.   Synjardy  12.12-998 MG Tabs Generic drug: Empagliflozin-metFORMIN HCl Take 1 tablet by mouth 2 (two) times daily.   tamsulosin  0.4 MG Caps capsule Commonly known as: FLOMAX  Take 0.4 mg by mouth daily.   Vitamin  D3 10 MCG (400 UNIT) tablet Take 20 mcg by mouth daily.        Allergies:  Allergies  Allergen Reactions   Fentanyl Other (See Comments)    Stopped breathing    Family History: Family History  Adopted: Yes  Family history unknown: Yes    Social History:  reports that he has never smoked. He has never been exposed to tobacco smoke. He has never used smokeless tobacco. He reports current alcohol use. He reports that he does not use drugs.   Physical Exam: There were no vitals taken for this visit.  Constitutional:  Well nourished. Alert and oriented, No acute distress. HEENT: [place] AT, moist mucus  membranes.  Trachea midline, no masses. Cardiovascular: No clubbing, cyanosis, or edema. Respiratory: Normal respiratory effort, no increased work of breathing. GI: Abdomen is soft, non tender, non distended, no abdominal masses. Liver and spleen not palpable.  No hernias appreciated.  Stool sample for occult testing is not indicated.   GU: No CVA tenderness.  No bladder fullness or masses.  Patient with circumcised/uncircumcised phallus. ***Foreskin easily retracted***  Urethral meatus is patent.  No penile discharge. No penile lesions or rashes. Scrotum without lesions, cysts, rashes and/or edema.  Testicles are located scrotally bilaterally. No masses are appreciated in the testicles. Left and right epididymis are normal. Rectal: Patient with  normal sphincter tone. Anus and perineum without scarring or rashes. No rectal masses are appreciated. Prostate is approximately *** grams, *** nodules are appreciated. Seminal vesicles are normal. Skin: No rashes, bruises or suspicious lesions. Lymph: No cervical or inguinal adenopathy. Neurologic: Grossly intact, no focal deficits, moving all 4 extremities. Psychiatric: Normal mood and affect.   Laboratory Data: See Epic and HPI  I have reviewed the labs.   Pertinent Imaging: N/A  Assessment & Plan:    1. UTI - ***  2. Incomplete bladder emptying - managed with [facility] x 2-3 [facility]  3. High risk hematuria -non-smoker -work up 2021 - enlarged prostate - No reports of gross hematuria - recent UA today negative for micro heme  No follow-ups on file.  Kyle Richards   [facility] [address], [address] [place], [place] [address] [phone]

## 2024-06-03 ENCOUNTER — Ambulatory Visit: Admitting: Urology

## 2024-06-03 DIAGNOSIS — R319 Hematuria, unspecified: Secondary | ICD-10-CM

## 2024-06-03 DIAGNOSIS — R339 Retention of urine, unspecified: Secondary | ICD-10-CM

## 2024-08-07 NOTE — Progress Notes (Signed)
 Patient was on the phone with the [employer] and had to reschedule.

## 2024-08-11 ENCOUNTER — Ambulatory Visit (INDEPENDENT_AMBULATORY_CARE_PROVIDER_SITE_OTHER): Payer: Self-pay | Admitting: Urology

## 2024-08-11 VITALS — BP 149/88 | HR 81 | Ht 68.0 in | Wt 181.0 lb

## 2024-08-11 DIAGNOSIS — N401 Enlarged prostate with lower urinary tract symptoms: Secondary | ICD-10-CM

## 2024-08-11 DIAGNOSIS — N138 Other obstructive and reflux uropathy: Secondary | ICD-10-CM

## 2024-08-11 DIAGNOSIS — R319 Hematuria, unspecified: Secondary | ICD-10-CM

## 2024-08-11 DIAGNOSIS — R339 Retention of urine, unspecified: Secondary | ICD-10-CM

## 2024-08-15 NOTE — Progress Notes (Signed)
 "   08/24/2024 9:04 AM   [name] [birth year] [id number]  Referring provider:  Valerio Melanie DASEN, NP [address] [place],  [place] [address]  Urological history  1. High risk hematuria -non-smoker -RUS 06/2020 Normal ultrasound appearance of the kidneys-Enlarged prostate -cysto 06/2020 Enlarged prostate with significant bilobar coaptation - Mild bladder neck  2. Incomplete emptying -managed with CIC x 2-3   3. Bilateral hydroceles -scrotal ultrasound 03/2021 - small bilateral hydroceles   4. Epididymitis -scrotal ultrasound 03/2021 - right sided epididymitis   5. BPH with incomplete bladder emptying -PSA (03/2024) 1.8 -prostate volume 45 cc  Chief Complaint  Patient presents with   Incomplete bladder emptying    HPI: Kyle Richards is a 78 y.o.male who presents today for yearly follow up.   Patient was on the phone with the [employer] and had to reschedule.  Previous records reviewed.     I PSS 2/2  He reports nocturia x 2.  He states he can sleep 4 to 5 hours a night.  He is still able to void spontaneously.  He self caths about 4 times daily.  [name]  Patient denies any recent UTI's, gross hematuria, dysuria or suprapubic/flank pain.  Patient denies any fevers, chills, nausea or vomiting.    He does not know his family medical history as he is adopted.  UA (05/2024) unremarkable   PSA  (03/2024) 1.8  Hemoglobin A1c (01/2024) 6.9  Serum creatinine (10/3033) 1.22, eGFR 61  PMH: Past Medical History:  Diagnosis Date   Diabetes (HCC)    Heart attack (HCC) 2012   High cholesterol    Hypertension    Neurogenic bladder disorder    Neuropathy     Surgical History: Past Surgical History:  Procedure Laterality Date   FINGER FRACTURE SURGERY Left    middle finger left hand   OTHER SURGICAL HISTORY     Heart Bypass   TONSILLECTOMY AND ADENOIDECTOMY  1953    Home Medications:  Allergies as of  08/22/2024       Reactions   Fentanyl Other (See Comments)   Stopped breathing        Medication List        Accurate as of August 22, 2024 11:59 PM. If you have any questions, ask your nurse or doctor.          aspirin 81 MG chewable tablet Chew 81 mg by mouth daily.   atorvastatin 40 MG tablet Commonly known as: LIPITOR Take 40 mg by mouth daily.   cyanocobalamin 500 MCG tablet Commonly known as: VITAMIN B12 Take 2 tablets by mouth daily.   finasteride 5 MG tablet Commonly known as: PROSCAR Take 5 mg by mouth daily.   lisinopril 20 MG tablet Commonly known as: ZESTRIL Take 20 mg by mouth daily.   metoprolol  succinate 25 MG 24 hr tablet Commonly known as: Toprol  XL Take 1 tablet (25 mg total) by mouth daily.   Synjardy  12.12-998 MG Tabs Generic drug: Empagliflozin-metFORMIN HCl Take 1 tablet by mouth 2 (two) times daily.   tamsulosin  0.4 MG Caps capsule Commonly known as: FLOMAX  Take 0.4 mg by mouth daily.   Vitamin D3 10 MCG (400 UNIT) tablet Take 20 mcg by mouth daily.        Allergies:  Allergies  Allergen Reactions   Fentanyl Other (See Comments)    Stopped breathing    Family History: Family History  Adopted: Yes  Family history  unknown: Yes    Social History:  reports that he has never smoked. He has never been exposed to tobacco smoke. He has never used smokeless tobacco. He reports current alcohol use. He reports that he does not use drugs.   Physical Exam: BP (!) 140/81   Pulse 88   Ht 5' 8 (1.727 m)   Wt 180 lb (81.6 kg)   BMI 27.37 kg/m   Constitutional:  Well nourished. Alert and oriented, No acute distress. HEENT: [place] AT, moist mucus membranes.  Trachea midline Cardiovascular: No clubbing, cyanosis, or edema. Respiratory: Normal respiratory effort, no increased work of breathing. Neurologic: Grossly intact, no focal deficits, moving all 4 extremities. Psychiatric: Normal mood and affect.   Laboratory Data: See  Epic and HPI  I have reviewed the labs.   Pertinent Imaging: N/A   Assessment & Plan:    1. Incomplete bladder emptying - managed with [facility] x 4 - [facility]  2. High risk hematuria - non-smoker - work up 2021 - enlarged prostate - No reports of gross hematuria - UA (05/2024) negative for micro heme   3. BPH with LU TS - mild symptoms and he is mostly satisfied with his urinary symptoms - PSA up to date  - UA benign  - encouraged avoiding bladder irritants, fluid restriction before bedtime and timed voiding's - Continue tamsulosin  0.4 mg daily and finasteride 5 mg daily; refills given through the [facility]  Return in about 1 year (around 08/22/2025) for UA, I PSS.  CLOTILDA HELON RIGGERS   [facility] [address], [address] [place], [place] [address] [phone] "

## 2024-08-22 ENCOUNTER — Ambulatory Visit (INDEPENDENT_AMBULATORY_CARE_PROVIDER_SITE_OTHER): Admitting: Urology

## 2024-08-22 ENCOUNTER — Encounter: Payer: Self-pay | Admitting: Urology

## 2024-08-22 VITALS — BP 140/81 | HR 88 | Ht 68.0 in | Wt 180.0 lb

## 2024-08-22 DIAGNOSIS — R319 Hematuria, unspecified: Secondary | ICD-10-CM | POA: Diagnosis not present

## 2024-08-22 DIAGNOSIS — N138 Other obstructive and reflux uropathy: Secondary | ICD-10-CM | POA: Diagnosis not present

## 2024-08-22 DIAGNOSIS — R339 Retention of urine, unspecified: Secondary | ICD-10-CM | POA: Diagnosis not present

## 2024-08-22 DIAGNOSIS — N401 Enlarged prostate with lower urinary tract symptoms: Secondary | ICD-10-CM | POA: Diagnosis not present

## 2024-12-02 ENCOUNTER — Ambulatory Visit: Admitting: Nurse Practitioner

## 2025-02-21 ENCOUNTER — Ambulatory Visit

## 2025-08-22 ENCOUNTER — Ambulatory Visit: Admitting: Urology
